# Patient Record
Sex: Female | Born: 1945 | Race: White | Hispanic: No | Marital: Married | Smoking: Never smoker
Health system: Southern US, Community
[De-identification: ages and names within clinical notes are randomized; demographics above are authoritative.]

## PROBLEM LIST (undated history)

## (undated) ENCOUNTER — Emergency Department (HOSPITAL_COMMUNITY): Discharge status: Absent without leave | Payer: Medicare Other

## (undated) DIAGNOSIS — E785 Hyperlipidemia, unspecified: Secondary | ICD-10-CM

## (undated) DIAGNOSIS — J040 Acute laryngitis: Secondary | ICD-10-CM

## (undated) DIAGNOSIS — I10 Essential (primary) hypertension: Secondary | ICD-10-CM

## (undated) DIAGNOSIS — C801 Malignant (primary) neoplasm, unspecified: Secondary | ICD-10-CM

## (undated) DIAGNOSIS — K219 Gastro-esophageal reflux disease without esophagitis: Secondary | ICD-10-CM

## (undated) DIAGNOSIS — F32A Depression, unspecified: Secondary | ICD-10-CM

## (undated) DIAGNOSIS — F419 Anxiety disorder, unspecified: Secondary | ICD-10-CM

## (undated) DIAGNOSIS — R000 Tachycardia, unspecified: Secondary | ICD-10-CM

## (undated) DIAGNOSIS — M8580 Other specified disorders of bone density and structure, unspecified site: Secondary | ICD-10-CM

## (undated) DIAGNOSIS — R51 Headache: Principal | ICD-10-CM

## (undated) DIAGNOSIS — F329 Major depressive disorder, single episode, unspecified: Secondary | ICD-10-CM

## (undated) HISTORY — DX: Headache: R51

## (undated) HISTORY — DX: Major depressive disorder, single episode, unspecified: F32.9

## (undated) HISTORY — DX: Malignant (primary) neoplasm, unspecified: C80.1

## (undated) HISTORY — DX: Gastro-esophageal reflux disease without esophagitis: K21.9

## (undated) HISTORY — DX: Hyperlipidemia, unspecified: E78.5

## (undated) HISTORY — DX: Other specified disorders of bone density and structure, unspecified site: M85.80

## (undated) HISTORY — DX: Tachycardia, unspecified: R00.0

## (undated) HISTORY — DX: Depression, unspecified: F32.A

## (undated) HISTORY — DX: Anxiety disorder, unspecified: F41.9

## (undated) HISTORY — DX: Acute laryngitis: J04.0

---

## 1997-08-21 ENCOUNTER — Ambulatory Visit: Admission: RE | Admit: 1997-08-21 | Discharge: 1997-08-21 | Payer: Self-pay

## 1998-11-27 ENCOUNTER — Emergency Department (HOSPITAL_COMMUNITY): Admission: EM | Admit: 1998-11-27 | Discharge: 1998-11-27 | Payer: Self-pay | Admitting: Emergency Medicine

## 2001-02-04 ENCOUNTER — Emergency Department (HOSPITAL_COMMUNITY): Admission: EM | Admit: 2001-02-04 | Discharge: 2001-02-04 | Payer: Self-pay | Admitting: Emergency Medicine

## 2001-02-10 ENCOUNTER — Encounter: Payer: Self-pay | Admitting: Family Medicine

## 2001-02-10 ENCOUNTER — Encounter: Payer: Self-pay | Admitting: Neurology

## 2001-02-10 ENCOUNTER — Ambulatory Visit (HOSPITAL_COMMUNITY): Admission: RE | Admit: 2001-02-10 | Discharge: 2001-02-10 | Payer: Self-pay | Admitting: Family Medicine

## 2001-02-15 ENCOUNTER — Encounter: Admission: RE | Admit: 2001-02-15 | Discharge: 2001-05-16 | Payer: Self-pay | Admitting: Neurology

## 2001-02-20 ENCOUNTER — Encounter: Payer: Self-pay | Admitting: Family Medicine

## 2001-02-20 ENCOUNTER — Encounter: Admission: RE | Admit: 2001-02-20 | Discharge: 2001-02-20 | Payer: Self-pay | Admitting: Family Medicine

## 2003-11-24 ENCOUNTER — Emergency Department (HOSPITAL_COMMUNITY): Admission: EM | Admit: 2003-11-24 | Discharge: 2003-11-24 | Payer: Self-pay | Admitting: Emergency Medicine

## 2004-01-31 ENCOUNTER — Ambulatory Visit: Payer: Self-pay | Admitting: Internal Medicine

## 2004-02-03 ENCOUNTER — Ambulatory Visit (HOSPITAL_COMMUNITY): Admission: RE | Admit: 2004-02-03 | Discharge: 2004-02-03 | Payer: Self-pay | Admitting: Internal Medicine

## 2004-02-03 ENCOUNTER — Encounter (INDEPENDENT_AMBULATORY_CARE_PROVIDER_SITE_OTHER): Payer: Self-pay | Admitting: *Deleted

## 2004-02-03 ENCOUNTER — Ambulatory Visit: Payer: Self-pay | Admitting: Internal Medicine

## 2004-02-03 HISTORY — PX: COLONOSCOPY: SHX174

## 2004-02-03 LAB — HM COLONOSCOPY

## 2004-05-21 ENCOUNTER — Ambulatory Visit: Payer: Self-pay | Admitting: Internal Medicine

## 2004-05-21 ENCOUNTER — Ambulatory Visit: Payer: Self-pay | Admitting: Family Medicine

## 2004-06-23 ENCOUNTER — Ambulatory Visit: Payer: Self-pay | Admitting: Internal Medicine

## 2004-08-20 ENCOUNTER — Ambulatory Visit: Payer: Self-pay | Admitting: Family Medicine

## 2004-08-27 ENCOUNTER — Ambulatory Visit: Payer: Self-pay | Admitting: Family Medicine

## 2005-02-04 ENCOUNTER — Ambulatory Visit: Payer: Self-pay | Admitting: Family Medicine

## 2005-08-17 ENCOUNTER — Ambulatory Visit: Payer: Self-pay | Admitting: Family Medicine

## 2005-09-20 ENCOUNTER — Ambulatory Visit: Payer: Self-pay | Admitting: Family Medicine

## 2005-10-01 ENCOUNTER — Ambulatory Visit: Payer: Self-pay | Admitting: Family Medicine

## 2006-01-13 ENCOUNTER — Ambulatory Visit: Payer: Self-pay | Admitting: Family Medicine

## 2006-03-11 ENCOUNTER — Ambulatory Visit: Payer: Self-pay | Admitting: Family Medicine

## 2006-03-11 ENCOUNTER — Encounter: Admission: RE | Admit: 2006-03-11 | Discharge: 2006-03-11 | Payer: Self-pay | Admitting: Family Medicine

## 2006-05-09 ENCOUNTER — Encounter: Admission: RE | Admit: 2006-05-09 | Discharge: 2006-05-09 | Payer: Self-pay | Admitting: Orthopaedic Surgery

## 2006-07-14 ENCOUNTER — Encounter: Admission: RE | Admit: 2006-07-14 | Discharge: 2006-07-14 | Payer: Self-pay | Admitting: Orthopaedic Surgery

## 2006-10-28 DIAGNOSIS — F329 Major depressive disorder, single episode, unspecified: Secondary | ICD-10-CM | POA: Insufficient documentation

## 2006-10-28 DIAGNOSIS — F411 Generalized anxiety disorder: Secondary | ICD-10-CM | POA: Insufficient documentation

## 2006-10-28 DIAGNOSIS — K219 Gastro-esophageal reflux disease without esophagitis: Secondary | ICD-10-CM | POA: Insufficient documentation

## 2006-10-28 DIAGNOSIS — E785 Hyperlipidemia, unspecified: Secondary | ICD-10-CM | POA: Insufficient documentation

## 2006-10-28 DIAGNOSIS — N943 Premenstrual tension syndrome: Secondary | ICD-10-CM | POA: Insufficient documentation

## 2006-10-28 DIAGNOSIS — G43909 Migraine, unspecified, not intractable, without status migrainosus: Secondary | ICD-10-CM | POA: Insufficient documentation

## 2006-11-01 ENCOUNTER — Telehealth (INDEPENDENT_AMBULATORY_CARE_PROVIDER_SITE_OTHER): Payer: Self-pay | Admitting: *Deleted

## 2006-12-05 ENCOUNTER — Ambulatory Visit: Payer: Self-pay | Admitting: Family Medicine

## 2006-12-05 LAB — CONVERTED CEMR LAB
ALT: 12 units/L (ref 0–35)
Basophils Absolute: 0 10*3/uL (ref 0.0–0.1)
Bilirubin, Direct: 0.1 mg/dL (ref 0.0–0.3)
CO2: 34 meq/L — ABNORMAL HIGH (ref 19–32)
Calcium: 9.2 mg/dL (ref 8.4–10.5)
Chloride: 108 meq/L (ref 96–112)
Creatinine, Ser: 1 mg/dL (ref 0.4–1.2)
Eosinophils Relative: 1.4 % (ref 0.0–5.0)
Glucose, Bld: 102 mg/dL — ABNORMAL HIGH (ref 70–99)
LDL Cholesterol: 105 mg/dL — ABNORMAL HIGH (ref 0–99)
MCV: 94.3 fL (ref 78.0–100.0)
Monocytes Relative: 8.4 % (ref 3.0–11.0)
Nitrite: NEGATIVE
Platelets: 249 10*3/uL (ref 150–400)
Protein, U semiquant: NEGATIVE
RDW: 12.4 % (ref 11.5–14.6)
Sodium: 145 meq/L (ref 135–145)
Specific Gravity, Urine: >=1.03
TSH: 2.15 microintl units/mL (ref 0.35–5.50)
Total Protein: 6.6 g/dL (ref 6.0–8.3)
Urobilinogen, UA: NEGATIVE
VLDL: 16 mg/dL (ref 0–40)
pH: 6

## 2006-12-09 ENCOUNTER — Ambulatory Visit: Payer: Self-pay | Admitting: Family Medicine

## 2006-12-20 ENCOUNTER — Telehealth: Payer: Self-pay | Admitting: Family Medicine

## 2006-12-21 ENCOUNTER — Telehealth: Payer: Self-pay | Admitting: Family Medicine

## 2007-01-31 ENCOUNTER — Ambulatory Visit: Payer: Self-pay | Admitting: Family Medicine

## 2007-02-09 ENCOUNTER — Telehealth: Payer: Self-pay | Admitting: Family Medicine

## 2007-02-28 ENCOUNTER — Telehealth: Payer: Self-pay | Admitting: Family Medicine

## 2007-03-15 ENCOUNTER — Ambulatory Visit: Payer: Self-pay | Admitting: Family Medicine

## 2007-06-27 ENCOUNTER — Ambulatory Visit: Payer: Self-pay | Admitting: Family Medicine

## 2007-06-30 ENCOUNTER — Telehealth: Payer: Self-pay | Admitting: Family Medicine

## 2007-07-03 ENCOUNTER — Emergency Department (HOSPITAL_COMMUNITY): Admission: EM | Admit: 2007-07-03 | Discharge: 2007-07-03 | Payer: Self-pay | Admitting: Emergency Medicine

## 2007-07-06 ENCOUNTER — Telehealth: Payer: Self-pay | Admitting: Family Medicine

## 2007-07-10 DIAGNOSIS — L509 Urticaria, unspecified: Secondary | ICD-10-CM | POA: Insufficient documentation

## 2007-07-11 ENCOUNTER — Ambulatory Visit: Payer: Self-pay | Admitting: Family Medicine

## 2007-07-12 ENCOUNTER — Ambulatory Visit: Payer: Self-pay | Admitting: Family Medicine

## 2007-07-14 ENCOUNTER — Telehealth: Payer: Self-pay | Admitting: Family Medicine

## 2007-07-14 ENCOUNTER — Ambulatory Visit: Payer: Self-pay | Admitting: Family Medicine

## 2007-07-18 ENCOUNTER — Ambulatory Visit: Payer: Self-pay | Admitting: Family Medicine

## 2007-07-18 ENCOUNTER — Telehealth: Payer: Self-pay | Admitting: Family Medicine

## 2007-07-27 ENCOUNTER — Telehealth: Payer: Self-pay | Admitting: Family Medicine

## 2007-08-02 ENCOUNTER — Ambulatory Visit: Payer: Self-pay | Admitting: Family Medicine

## 2007-08-02 LAB — CONVERTED CEMR LAB
Blood in Urine, dipstick: NEGATIVE
Ketones, urine, test strip: NEGATIVE
Protein, U semiquant: NEGATIVE
Specific Gravity, Urine: 1.01
Urobilinogen, UA: 0.2
WBC Urine, dipstick: NEGATIVE

## 2007-12-28 ENCOUNTER — Ambulatory Visit: Payer: Self-pay | Admitting: Family Medicine

## 2008-03-08 ENCOUNTER — Ambulatory Visit: Payer: Self-pay | Admitting: Family Medicine

## 2008-03-08 LAB — CONVERTED CEMR LAB
ALT: 10 units/L (ref 0–35)
AST: 17 units/L (ref 0–37)
Albumin: 4.2 g/dL (ref 3.5–5.2)
Basophils Absolute: 0 10*3/uL (ref 0.0–0.1)
Basophils Relative: 0.3 % (ref 0.0–3.0)
Cholesterol: 273 mg/dL (ref 0–200)
Creatinine, Ser: 1.1 mg/dL (ref 0.4–1.2)
Direct LDL: 159.9 mg/dL
Eosinophils Relative: 1.4 % (ref 0.0–5.0)
Hemoglobin: 14.1 g/dL (ref 12.0–15.0)
MCHC: 34.6 g/dL (ref 30.0–36.0)
Monocytes Absolute: 0.4 10*3/uL (ref 0.1–1.0)
Neutro Abs: 2.4 10*3/uL (ref 1.4–7.7)
Nitrite: NEGATIVE
Platelets: 249 10*3/uL (ref 150–400)
Potassium: 4.5 meq/L (ref 3.5–5.1)
Sodium: 144 meq/L (ref 135–145)
Specific Gravity, Urine: >=1.03
TSH: 2.47 microintl units/mL (ref 0.35–5.50)
Total CHOL/HDL Ratio: 3.1
Urobilinogen, UA: 0.2
VLDL: 16 mg/dL (ref 0–40)

## 2008-03-12 ENCOUNTER — Ambulatory Visit: Payer: Self-pay | Admitting: Family Medicine

## 2008-04-02 ENCOUNTER — Encounter: Admission: RE | Admit: 2008-04-02 | Discharge: 2008-04-02 | Payer: Self-pay | Admitting: Family Medicine

## 2008-04-12 ENCOUNTER — Telehealth: Payer: Self-pay | Admitting: *Deleted

## 2008-06-11 ENCOUNTER — Telehealth: Payer: Self-pay | Admitting: *Deleted

## 2008-08-12 ENCOUNTER — Telehealth: Payer: Self-pay | Admitting: Internal Medicine

## 2008-08-14 ENCOUNTER — Ambulatory Visit: Payer: Self-pay | Admitting: Internal Medicine

## 2008-08-15 LAB — CONVERTED CEMR LAB
Alkaline Phosphatase: 52 units/L (ref 39–117)
Basophils Relative: 0.4 % (ref 0.0–3.0)
CO2: 33 meq/L — ABNORMAL HIGH (ref 19–32)
Eosinophils Absolute: 0.1 10*3/uL (ref 0.0–0.7)
Glucose, Bld: 112 mg/dL — ABNORMAL HIGH (ref 70–99)
HCT: 39.6 % (ref 36.0–46.0)
Hemoglobin: 14 g/dL (ref 12.0–15.0)
Lymphocytes Relative: 22.6 % (ref 12.0–46.0)
MCHC: 35.2 g/dL (ref 30.0–36.0)
MCV: 93.1 fL (ref 78.0–100.0)
Neutro Abs: 3.6 10*3/uL (ref 1.4–7.7)
Potassium: 3.9 meq/L (ref 3.5–5.1)
RBC: 4.25 M/uL (ref 3.87–5.11)
Sodium: 146 meq/L — ABNORMAL HIGH (ref 135–145)
Total Bilirubin: 0.7 mg/dL (ref 0.3–1.2)

## 2008-08-19 ENCOUNTER — Telehealth: Payer: Self-pay | Admitting: Internal Medicine

## 2008-08-27 ENCOUNTER — Telehealth: Payer: Self-pay | Admitting: Internal Medicine

## 2008-08-27 ENCOUNTER — Encounter: Payer: Self-pay | Admitting: Internal Medicine

## 2008-08-29 ENCOUNTER — Ambulatory Visit: Payer: Self-pay | Admitting: Internal Medicine

## 2008-08-29 HISTORY — PX: UPPER GASTROINTESTINAL ENDOSCOPY: SHX188

## 2008-09-16 ENCOUNTER — Ambulatory Visit: Payer: Self-pay | Admitting: Internal Medicine

## 2008-12-12 ENCOUNTER — Telehealth: Payer: Self-pay | Admitting: Family Medicine

## 2009-01-22 ENCOUNTER — Ambulatory Visit: Payer: Self-pay | Admitting: Family Medicine

## 2009-03-31 ENCOUNTER — Telehealth: Payer: Self-pay | Admitting: Internal Medicine

## 2009-04-25 ENCOUNTER — Ambulatory Visit: Payer: Self-pay | Admitting: Internal Medicine

## 2009-04-25 LAB — CONVERTED CEMR LAB
Alkaline Phosphatase: 49 units/L (ref 39–117)
Basophils Absolute: 0.1 10*3/uL (ref 0.0–0.1)
Basophils Relative: 1.3 % (ref 0.0–3.0)
Calcium: 9.8 mg/dL (ref 8.4–10.5)
Creatinine, Ser: 1.1 mg/dL (ref 0.4–1.2)
Direct LDL: 130.4 mg/dL
Eosinophils Absolute: 0.1 10*3/uL (ref 0.0–0.7)
Glucose, Bld: 103 mg/dL — ABNORMAL HIGH (ref 70–99)
HCT: 40.6 % (ref 36.0–46.0)
HDL: 90.2 mg/dL (ref >39.00)
Lymphocytes Relative: 25.9 % (ref 12.0–46.0)
Lymphs Abs: 1.3 10*3/uL (ref 0.7–4.0)
MCHC: 34.2 g/dL (ref 30.0–36.0)
Monocytes Absolute: 0.4 10*3/uL (ref 0.1–1.0)
Neutro Abs: 3.2 10*3/uL (ref 1.4–7.7)
Neutrophils Relative %: 62.1 % (ref 43.0–77.0)
Nitrite: NEGATIVE
Platelets: 246 10*3/uL (ref 150.0–400.0)
Specific Gravity, Urine: 1.025
TSH: 3.11 microintl units/mL (ref 0.35–5.50)
Total Bilirubin: 1.2 mg/dL (ref 0.3–1.2)
Urobilinogen, UA: 0.2
WBC Urine, dipstick: NEGATIVE
WBC: 5.1 10*3/uL (ref 4.5–10.5)
pH: 5.5

## 2009-05-06 ENCOUNTER — Telehealth: Payer: Self-pay | Admitting: *Deleted

## 2009-05-06 ENCOUNTER — Ambulatory Visit: Payer: Self-pay | Admitting: Internal Medicine

## 2009-05-07 ENCOUNTER — Telehealth: Payer: Self-pay | Admitting: Internal Medicine

## 2009-05-13 ENCOUNTER — Encounter: Payer: Self-pay | Admitting: Internal Medicine

## 2009-05-18 DIAGNOSIS — M899 Disorder of bone, unspecified: Secondary | ICD-10-CM | POA: Insufficient documentation

## 2009-05-18 DIAGNOSIS — M949 Disorder of cartilage, unspecified: Secondary | ICD-10-CM

## 2009-05-28 ENCOUNTER — Encounter: Admission: RE | Admit: 2009-05-28 | Discharge: 2009-05-28 | Payer: Self-pay | Admitting: Internal Medicine

## 2009-05-29 ENCOUNTER — Telehealth: Payer: Self-pay | Admitting: *Deleted

## 2009-06-06 ENCOUNTER — Telehealth: Payer: Self-pay | Admitting: *Deleted

## 2009-08-06 ENCOUNTER — Telehealth: Payer: Self-pay | Admitting: Internal Medicine

## 2009-09-22 ENCOUNTER — Telehealth: Payer: Self-pay | Admitting: Internal Medicine

## 2009-11-20 ENCOUNTER — Ambulatory Visit: Payer: Self-pay | Admitting: Internal Medicine

## 2009-11-20 ENCOUNTER — Telehealth: Payer: Self-pay | Admitting: Internal Medicine

## 2009-11-24 ENCOUNTER — Telehealth: Payer: Self-pay | Admitting: *Deleted

## 2009-12-03 ENCOUNTER — Encounter: Payer: Self-pay | Admitting: *Deleted

## 2009-12-05 ENCOUNTER — Ambulatory Visit: Payer: Self-pay | Admitting: Internal Medicine

## 2009-12-23 ENCOUNTER — Telehealth: Payer: Self-pay | Admitting: Internal Medicine

## 2009-12-23 LAB — CONVERTED CEMR LAB: PTH: 19.7 pg/mL (ref 14.0–72.0)

## 2010-02-16 ENCOUNTER — Telehealth: Payer: Self-pay | Admitting: *Deleted

## 2010-02-25 ENCOUNTER — Telehealth: Payer: Self-pay | Admitting: Internal Medicine

## 2010-04-18 ENCOUNTER — Other Ambulatory Visit: Payer: Self-pay | Admitting: Internal Medicine

## 2010-04-18 DIAGNOSIS — Z1239 Encounter for other screening for malignant neoplasm of breast: Secondary | ICD-10-CM

## 2010-04-19 ENCOUNTER — Encounter: Payer: Self-pay | Admitting: Orthopaedic Surgery

## 2010-04-28 NOTE — Progress Notes (Signed)
Summary: Needs labs and a rov  ---- Converted from flag ---- ---- 11/21/2009 5:58 PM, Madelin Headings MD wrote: make sure oder  is for vit d and intact pth dx osteopenia and then follow up with results  thanks ------------------------------  Pt didn't come in for these labs. I have left a message for pt to call back. Romualdo Bolk, CMA [facility])  November 24, 2009 4:56 PM  LMTOCB Romualdo Bolk, CMA [facility])  November 26, 2009 8:48 AM  LMTOCB Romualdo Bolk, CMA [facility])  December 02, 2009 8:54 AM  Pt has never returned my calls. I mailed her a letter about this. Romualdo Bolk, CMA (AAMA)  December 03, 2009 9:06 AM

## 2010-04-28 NOTE — Progress Notes (Signed)
Summary: Requesting to Switch PCP  Phone Note Call from Patient Call back at Home Phone [phone]   Caller: Patient Summary of Call: Pt requested to switch PCP from Todd to Caelen Reierson.  Checking to see if this has been authorized by both physicians so she can go ahead and make an appointment. Initial call taken by: Trixie Dredge,  March 31, 2009 12:21 PM  Follow-up for Phone Call        ok........Marland Kitchenset up new pt cpx w Dr. Marvell Fuller Follow-up by: Roderick Pee MD,  March 31, 2009 1:11 PM  Additional Follow-up for Phone Call Additional follow up Details #1::        Pt called back. I scheduled new pt cpx with Dr. Marvell Fuller as noted above. Additional Follow-up by: Lucy Antigua,  April 01, 2009 4:54 PM    Additional Follow-up for Phone Call Additional follow up Details #2::    ok  Follow-up by: Madelin Headings MD,  April 04, 2009 5:42 PM

## 2010-04-28 NOTE — Progress Notes (Signed)
Summary: questions about labs  Phone Note Call from Patient Call back at Home Phone [phone]   Caller: Patient Summary of Call: Pt is wanting to know why the dexa showed what it did if her labs are normal? Initial call taken by: Romualdo Bolk, CMA [facility]ull),  December 23, 2009 4:33 PM  Follow-up for Phone Call        lots of causes  We checked for the most  common... most often  age   nutrition and genetics related  .   rec weight bearing exercise  and enough calcium vit d in diet .  If wants to discuss    this then  can scedule ov to discuss . Follow-up by: Madelin Headings MD,  December 25, 2009 11:38 AM  Additional Follow-up for Phone Call Additional follow up Details #1::        Pt given Dr Rosezella Florida recommendatons. Additional Follow-up by: Lynann Beaver CMA,  December 26, 2009 10:54 AM

## 2010-04-28 NOTE — Progress Notes (Signed)
Summary: Medication  Phone Note Call from Patient Call back at Home Phone [phone]   Caller: Patient Call For: Dr. Leone Payor Reason for Call: Refill Medication Summary of Call: Need refill of Nexium #30.Marland Kitchen[facility]land KitchenMarland [facility] [phone] Initial call taken by: Karna Christmas,  Aug 06, 2009 9:40 AM  Follow-up for Phone Call        rx sent pt advised she needs ov in June she will call back to schedule . Follow-up by: Harlow Mares CMA [facility]),  Aug 07, 2009 2:07 PM    New/Updated Medications: NEXIUM 40 MG CPDR (ESOMEPRAZOLE MAGNESIUM) take one by mouth once daily Prescriptions: NEXIUM 40 MG CPDR (ESOMEPRAZOLE MAGNESIUM) take one by mouth once daily  #30 x 0   Entered by:   Harlow Mares CMA (AAMA)   Authorized by:   Iva Boop MD, [facility]   Signed by:   Harlow Mares CMA (AAMA) on 08/07/2009   Method used:   Electronically to        [facility]  [phone]* (retail)       [address]       [place], [place]  [address]       Ph: [phone] or [phone]       Fax: [phone]   RxID:   [id number]

## 2010-04-28 NOTE — Progress Notes (Signed)
Summary: Discuss alternatives instead of labs  Phone Note Call from Patient Call back at Home Phone [phone]   Caller: Patient Summary of Call: Pt called and has rcvd the results from her bone density test and does not understand them. Pt would like a call from Dr. Fabian Sharp or Carollee Herter to discuss.  Initial call taken by: Lucy Antigua,  May 29, 2009 2:25 PM  Follow-up for Phone Call        Left message for pt to call back. Follow-up by: Romualdo Bolk, CMA [facility] Dull),  May 30, 2009 4:24 PM  Additional Follow-up for Phone Call Additional follow up Details #1::        PT to drop off results to [place] and then we can let her know about the results. Additional Follow-up by: Romualdo Bolk, CMA [facility]ull),  May 30, 2009 4:34 PM    Additional Follow-up for Phone Call Additional follow up Details #2::    Left message for pt to call back. Need to see if pt has ever had a vit d or pth level done by another md. Romualdo Bolk, CMA (AAMA)  June 03, 2009 1:27 PM I spoke to pt and she hasn't taken any calcium or vit d supplements for 1 to 1 and 1/2 years. Pt has also never had her Vit D or PTH level check. Her last TSH was done in Jan 2011.   Follow-up by: Romualdo Bolk, CMA [facility]),  June 03, 2009 4:43 PM  Additional Follow-up for Phone Call Additional follow up Details #3:: Details for Additional Follow-up Action Taken: do the above tests and then ov to review   dx osteopenia Additional Follow-up by: Madelin Headings MD,  June 03, 2009 5:20 PM  Spoke to pt and she has a $5000.00 ded and she is wanting to know if there is a way to just put her on a supplement instead of doing labs. If it was coded under cpx it would have been covered.  She is wanting to keep her cost down. So she would like to know what the other alternatives are.  Romualdo Bolk, CMA (AAMA)  June 04, 2009 11:29 AM She can do vit d1000 international units per day and equivalent calcium 1200 mg per day.  Weight bearing exercise  at least 3 x per week.  and repeat  dexa in 2 years .  Madelin Headings MD  June 05, 2009 8:26 AM   LMTOCB Romualdo Bolk, CMA [facility])  June 05, 2009 8:49 AM Pt aware. Romualdo Bolk, CMA [facility])  June 05, 2009 11:49 AM

## 2010-04-28 NOTE — Assessment & Plan Note (Signed)
Summary: abd pain--ch.   History of Present Illness Visit Type: Follow-up Visit Primary GI MD: Stan Head MD [facility] Primary Provider: Governor Specking, MD Requesting Provider: n/a Chief Complaint: Follow up from Triage, pt c/o abdominal pain, pt states that the abdominal pain has subsided with taking the Nexium History of Present Illness:   65 yo ww with GERD but no esophageal inflammation or Barrett's esophagus. She has tried to go without or with as needed PPI but has been unable to. She is now on Nexium and epigastric burning seems controlled on 40 mg once daily. she has no Rx benefits and this is expensive but she is getting it. Minimizing caffeine and avoiding GERD inducing foods. In past had lost weight but this has levelled off.   GI Review of Systems      Denies abdominal pain, acid reflux, belching, bloating, chest pain, dysphagia with liquids, dysphagia with solids, heartburn, loss of appetite, nausea, vomiting, vomiting blood, weight loss, and  weight gain.        Denies anal fissure, black tarry stools, change in bowel habit, constipation, diarrhea, diverticulosis, fecal incontinence, heme positive stool, hemorrhoids, irritable bowel syndrome, jaundice, light color stool, liver problems, rectal bleeding, and  rectal pain.    Current Medications (verified): 1)  Maxzide-25 37.5-25 Mg  Tabs (Triamterene-Hctz) .... Take 1/2 Every Day 2)  Verapamil Hcl Cr 120 Mg Cr-Tabs (Verapamil Hcl) .Marland Kitchen.. 1 By Mouth Once Daily 3)  Maxalt 10 Mg  Tabs (Rizatriptan Benzoate) .... As Needed 4)  Estrace 0.1 Mg/gm  Crea (Estradiol) .... Uad W/o Applicator As Needed 5)  Taclonex 0.005-0.064 % Oint (Calcipotriene-Betameth Diprop) .... As Needed 6)  Lovastatin 10 Mg Tabs (Lovastatin) .Marland Kitchen.. 1 Tab @ Bedtime 7)  Analpram-Hc Singles 1-2.5 %  Crea (Hydrocortisone Ace-Pramoxine) .... Apply To Rectum 2-3 Times Per Day As Needed 8)  Trimethoprim 100 Mg Tabs (Trimethoprim) .Marland Kitchen.. 1 By Mouth As Directed  As  Needed 9)  Nexium 40 Mg Cpdr (Esomeprazole Magnesium) .... Take One By Mouth Once Daily 10)  Caltrate 600+d Plus 600-400 Mg-Unit Tabs (Calcium Carbonate-Vit D-Min) .... 2 By Mouth Once Daily  Allergies (verified): 1)  ! Erythromycin Ethylsuccinate 2)  ! Sulfadiazine (Sulfadiazine) 3)  ! Macrobid  Past History:  Past Medical History: Last updated: 05/06/2009 Anxiety Depression  remote hx of medication Hyperlipidemia PMS migraine headaches GERD Recurrent UTI's (largely post-coital) Child birth x 2  Hx of rapid heart rate Dexa -2.0 hip  2008  Osteopenia  Past Surgical History: Last updated: 2008-09-04 CB X2  Family History: Last updated: Sep 04, 2008 father it died at age 67, room of her arthritis.  Mother has hypertension, and four half sisters in good health.  No brothers No FH of Colon Cancer: Family History of Ovarian Cancer:Grandmother  Social History: Last updated: 05/06/2009 Married daughter and son, twins also that did not survive birth Never Smoked Alcohol use-no Drug use-no Regular exercise-yes-limited Daily Caffeine Use: One cup a day hh of 2   Vital Signs:  Patient profile:   65 year old female Menstrual status:  postmenopausal Height:      65.5 inches Weight:      130 pounds BMI:     21.38 BSA:     1.66 Pulse rate:   80 / minute Pulse rhythm:   regular BP sitting:   100 / 70  (left arm)  Vitals Entered By: Merri Ray CMA [facility]) (November 20, 2009 3:37 PM)  Physical Exam  General:  Well developed, well nourished, no acute  distress.   Impression & Recommendations:  Problem # 1:  GERD (ICD-530.81) Assessment Improved Nexium or other PPI chronically to control the symptoms. Rx written to get it from [place] to save $. We discussed possible bone loss issues with PPI's and how that seems plausible but not really proven. she needs a PPI to control sxs. She is on Ca and vit D but has not had vit D level checked.  15 mins  Problem # 2:   OSTEOPENIA (ICD-733.90) Assessment: Unchanged She has this and DEXA suggests more than normal rate of bone loss on sequential scans. We talked about checking a vit D level but I forgot to order this. Chart review shows that Dr. Fabian Sharp had considered a PTH level also but this was not done.  Will contact her and recommend she have the vit D level and PTH level (TSH is ok) and follow-up with Dr. Fabian Sharp as previously suggested re: osteopenia.  Patient Instructions: 1)  Continue Nexium as prescribed. 2)  Please schedule a follow-up appointment as needed.  3)  The medication list was reviewed and reconciled.  All changed / newly prescribed medications were explained.  A complete medication list was provided to the patient / caregiver. Prescriptions: NEXIUM 40 MG CPDR (ESOMEPRAZOLE MAGNESIUM) take one by mouth once daily  #140 x 3   Entered and Authorized by:   Iva Boop MD, [facility]   Signed by:   Iva Boop MD, [facility] on 11/20/2009   Method used:   Print then Give to Patient   RxID:   [id number]

## 2010-04-28 NOTE — Progress Notes (Signed)
Summary: vit D and PTH levels  Phone Note Outgoing Call   Summary of Call: Please let her know that sorry i did not get vit D leele ordered looking at Dr. Rosezella Florida recs from before she suggested a vit D and PTH intact level I agree and recommend she have these and follow-up with Dr. Fabian Sharp re: osteopenia issues. Iva Boop MD, [facility]  November 20, 2009 7:40 PM   Follow-up for Phone Call        pt is agreeable to plan.  Pt would prefer to go to Dr. Rosezella Florida office for labs as she closer to that office.  Advised pt I would send a copy of this note to Dr. Fabian Sharp and call their office to let them know she is coming. Pt states she will contact pharmacy today regarding Nexium.  Advised pt to call me when she starts her last bottle of samples if she has not received her mail order and I will put more samples at front desk. Follow-up by: Francee Piccolo CMA [facility]ull),  November 21, 2009 8:52 AM     Appended Document: vit D and PTH levels I spoke to [facility] at Dr. Rosezella Florida office.  She will put order/appt into [facility] for pt to come for labs.

## 2010-04-28 NOTE — Letter (Signed)
Summary: Generic Letter  [place] [facility]  [address] [place], [place] [address]   Phone: [phone]  Fax: [phone]    12/03/2009  [facility] [address] [place], [place]  [address]  Dear Ms. Matsuura,  We have been trying to contact you about your missed lab appointment. Please call us back at [phone] to reschedule this appointment.         Sincerely,   Tor Netters, CMA (AAMA)

## 2010-04-28 NOTE — Progress Notes (Signed)
Summary: Triage  Phone Note Call from Patient Call back at Home Phone [phone]   Caller: Patient Call For: Dr. Leone Payor Reason for Call: Talk to Nurse Summary of Call: pt is sch'd for 11-20-09 and having severe abd. pain, acid reflux, nauseated. Took Nexium for a mth and it helped until she went off of it Initial call taken by: Karna Christmas,  September 22, 2009 10:08 AM  Follow-up for Phone Call        Patient  with GERD and abdominal pain. She is out of Nexium and she didn't make an appointment as recommended in May.  Patient  is scheduled for an REV on 11/20/09.  I have sent her a refill to the pharmacy.  She has been offered an appointment with Mike Gip PA for tomorrow, but the patient wants to see Dr Leone Payor Follow-up by: Darcey Nora RN, CGRN,  September 22, 2009 10:34 AM    Prescriptions: NEXIUM 40 MG CPDR (ESOMEPRAZOLE MAGNESIUM) take one by mouth once daily  #30 x 1   Entered by:   Darcey Nora RN, CGRN   Authorized by:   Iva Boop MD, [facility]   Signed by:   Darcey Nora RN, CGRN on 09/22/2009   Method used:   Electronically to        [facility]. [phone]* (retail)       [phone] [address]       [place], [place]  [address]       Ph: [phone]       Fax: [phone]   RxID:   [id number]

## 2010-04-28 NOTE — Progress Notes (Signed)
Summary: Pt said generic Maxalt should have been sent to [facility]  Phone Note Call from Patient Call back at [facility] Phone [phone] Call back at [phone] cell   Caller: Patient Summary of Call: Pt called re: script for generic Maxalt script. Pt  said that script was suppose to be sent to [facility] not [facility]. Pls see previous phone note. Please put order number on rx [id number] fax # [phone] [facility]. Pt no longer uses [facility]. Pls remove [facility] from pt record.  Pt said since this was sent to wrong pharmacy, she will need samples of med in order to last until mail order arrives.  Initial call taken by: Lucy Antigua,  February 25, 2010 9:04 AM  Follow-up for Phone Call        Rx printed, signed, samples of Zomed given #5 oked by Dr Fabian Sharp as we had no samples of Maxalt.  Rx and samples will be picked up by pt, she will fax to her pharmacy Follow-up by: Sid Falcon LPN,  February 26, 2010 12:27 PM    Prescriptions: MAXALT 10 MG  TABS (RIZATRIPTAN BENZOATE) as needed [id number]  #4 x 1   Entered by:   Sid Falcon LPN   Authorized by:   Romualdo Bolk, CMA (AAMA)   Signed by:   Sid Falcon LPN on 13/24/4010   Method used:   Printed then faxed to ...       [facility]. [phone]* (retail)       [phone] [address]       [place], [place]  [address]       Ph: [phone]       Fax: [phone]   RxID:   [id number]

## 2010-04-28 NOTE — Progress Notes (Signed)
Summary: generic maxalt 10mg  #4  Phone Note Refill Request Message from:  Patient  Refills Requested: Medication #1:  MAXALT 10 MG  TABS as needed pt is requesting generic maxalt 10 mg #4. please put order number on rx [id number] fax # [phone] [facility]  Initial call taken by: Heron Sabins,  February 16, 2010 4:12 PM  Follow-up for Phone Call        ok   to refill x 2  Follow-up by: Madelin Headings MD,  February 16, 2010 4:49 PM  Additional Follow-up for Phone Call Additional follow up Details #1::        rx faxed to pharmacy. Additional Follow-up by: Romualdo Bolk, CMA (AAMA),  February 16, 2010 5:05 PM    New/Updated Medications: MAXALT 10 MG  TABS (RIZATRIPTAN BENZOATE) as needed [id number] Prescriptions: MAXALT 10 MG  TABS (RIZATRIPTAN BENZOATE) as needed [id number]  #4 x 1   Entered by:   Romualdo Bolk, CMA (AAMA)   Authorized by:   Madelin Headings MD   Signed by:   Romualdo Bolk, CMA (AAMA) on 02/16/2010   Method used:   Printed then faxed to ...       [facility]. [phone]* (retail)       [phone] [address].       [place], [place]  [address]       Ph: [phone]       Fax: [phone]   RxID:   [id number]

## 2010-04-28 NOTE — Procedures (Signed)
Summary: Colonoscopy: Hemorrhoids   Colonoscopy  Procedure date:  02/03/2004  Findings:      Results: Hemorrhoids. Location:  [facility].  Comments: Very small internal hemorrhoids. No polyps or cancer seen.  Comments:      Repeat colonoscopy in 10 years.    Procedures Next Due Date:    Colonoscopy: 01/2014  Patient Name: Brianna Harper, Brianna Harper MRN: [id number] Procedure Procedures: Colonoscopy CPT: 54098.  Personnel: Endoscopist: Iva Boop, MD, [facility]  Referred By: Mosetta Putt, M.D.  Exam Location: Exam performed in Endoscopy Suite. Outpatient  Patient Consent: Procedure, Alternatives, Risks and Benefits discussed, consent obtained,  Indications Symptoms: Hematochezia.  History  Current Medications: Patient is not currently taking Coumadin.  Pre-Exam Physical: Performed Feb 03, 2004. Cardio-pulmonary exam, Rectal exam, HEENT exam , Abdominal exam, Mental status exam WNL.  Exam Exam: Extent of exam reached: Cecum, extent intended: Cecum.  The cecum was identified by appendiceal orifice and IC valve. Patient position: on left side. Colon retroflexion performed. Images taken. ASA Classification: II. Tolerance: good.  Monitoring: Pulse and BP monitoring, Oximetry used. Supplemental O2 given.  Colon Prep Used MiraLax for colon prep. Prep results: excellent.  Fluoroscopy: Fluoroscopy was not used.  Sedation Meds: Patient assessed and found to be appropriate for moderate (conscious) sedation. Residual sedation present from prior procedure today.  Fentanyl 20 given IV. Versed 2 mg. given IV.  Findings - NORMAL EXAM: Cecum to Sigmoid Colon.  HEMORRHOIDS: Internal. Size: Grade I. ICD9: Hemorrhoids, Internal: 455.0. Comments: tiny.   Assessment Abnormal examination, see findings above.  Diagnoses: 455.0: Hemorrhoids, Internal.   Comments: Very mall internal hemorrhoids. No polyps or cancer seen. Events  Unplanned Interventions: No intervention  was required.  Plans Patient Education: Patient given standard instructions for: Hemorrhoids.  Disposition: After procedure patient sent to recovery. After recovery patient sent home.  Scheduling/Referral: Colonoscopy, to Iva Boop, MD, [facility], 10 yrs for repeat screening,  Primary Care Provider, to Mosetta Putt, M.D., as planned,   CC:   Mosetta Putt, MD  This report was created from the original endoscopy report, which was reviewed and signed by the above listed endoscopist.

## 2010-04-28 NOTE — Progress Notes (Signed)
Summary: Change verapamil 240mg  to 120mg   Phone Note Call from Patient Call back at [facility] Phone [phone]   Caller: Patient Complaint: Chest Pain Summary of Call: Pt called and said that the pharmacy told her she couldn't cut the verapamil 240mg  in 1/2. They are suggesting she take 120mg .  Can we change her to 120mg  and call in a 90 day supply? Initial call taken by: Romualdo Bolk, CMA [facility]ull),  June 06, 2009 9:18 AM  Follow-up for Phone Call        ok  Follow-up by: Madelin Headings MD,  June 06, 2009 4:58 PM  Additional Follow-up for Phone Call Additional follow up Details #1::        Rx sent to pharmacy and pt aware. Additional Follow-up by: Romualdo Bolk, CMA (AAMA),  June 06, 2009 5:01 PM    New/Updated Medications: VERAPAMIL HCL CR 120 MG CR-TABS (VERAPAMIL HCL) 1 by mouth once daily Prescriptions: VERAPAMIL HCL CR 120 MG CR-TABS (VERAPAMIL HCL) 1 by mouth once daily  #90 x 3   Entered by:   Romualdo Bolk, CMA (AAMA)   Authorized by:   Madelin Headings MD   Signed by:   Romualdo Bolk, CMA (AAMA) on 06/06/2009   Method used:   Electronically to        [facility]  [phone]* (retail)       [address]       [place], [place]  [address]       Ph: [phone] or [phone]       Fax: [phone]   RxID:   [id number]

## 2010-04-28 NOTE — Progress Notes (Signed)
Summary: maxzide Rx not given at [facility]  Phone Note Call from Patient Call back at [facility] Phone [phone]   Summary of Call: Did not get script for Maxzide- 25 37.5-25mg , triam 37.5mg /HCTZ 25mg  1/2 daily #45 for 90 day supply.  Please call into [facility], not [facility].  Under patient instructions, this is number 1.   Initial call taken by: Rudy Jew, RN,  May 06, 2009 4:49 PM  Follow-up for Phone Call        Rx sent to pharmacy Follow-up by: Romualdo Bolk, CMA [facility]),  May 06, 2009 5:04 PM    Prescriptions: MAXZIDE-25 37.5-25 MG  TABS (TRIAMTERENE-HCTZ) take 1/2 every day  #45 x 3   Entered by:   Romualdo Bolk, CMA (AAMA)   Authorized by:   Madelin Headings MD   Signed by:   Romualdo Bolk, CMA (AAMA) on 05/06/2009   Method used:   Electronically to        [facility]  [phone]* (retail)       [address]       [place], [place]  [address]       Ph: [phone] or [phone]       Fax: [phone]   RxID:   [id number]

## 2010-04-28 NOTE — Progress Notes (Signed)
Summary: severe ha a reaction to amox 875?  Phone Note Call from Patient Call back at [facility] Phone [phone]   Summary of Call: Re the amox 875 one three times a day.  Took one last pm & one today, 2nd one due, but she is experiencing severe headache.  Is the amox too strong?  Has taken 500mg  before.  The 875 is a scored tab.  Should she take 1/2? Initial call taken by: Rudy Jew, RN,  May 07, 2009 1:57 PM  Follow-up for Phone Call        she can try 1/2 dose  as let us know although  unsusal to cause this problem. ?could she be having her migraine and need to take her migrain med?  Follow-up by: Madelin Headings MD,  May 07, 2009 2:05 PM  Additional Follow-up for Phone Call Additional follow up Details #1::        Phone Call Completed Additional Follow-up by: Rudy Jew, RN,  May 07, 2009 2:15 PM

## 2010-04-28 NOTE — Assessment & Plan Note (Signed)
Summary: cpx/cjr/pt switched from Kiefer Opheim to Todd/per Dr/cjr   Vital Signs:  Patient profile:   65 year old female Menstrual status:  postmenopausal Height:      65.5 inches Weight:      131 pounds Pulse rate:   66 / minute BP sitting:   120 / 80  (right arm) Cuff size:   regular  Vitals Entered By: Romualdo Bolk, CMA (AAMA) (May 06, 2009 1:24 PM) CC: CPX- Discuss going pap     Menstrual Status postmenopausal Last PAP Result normal   History of Present Illness: Brianna Harper comesin today for    preventive visit . She is transferring care from Dr Tawanna Cooler.  She has a few ongoing medical conditionsthat are stable.  Her her husband had lost his job and she was not insurable because of  her dx of elevated lipids. So she is on new insurance with high deductable.   She has geen genereally healthy al her life .   LIPIDs ocass forgets her meds .  has been losing  weight  to help. MIGRAINES: better now   after menopause    rarely uses med .  BP:    better with recent weight loss.    at goal.    GI :   Leone Payor   no longer on antisasmodic  Problem with uri and sinus pressure and HA  ? sinsusitis.  would llike rx.    Preventive Care Screening  Pap Smear:    Date:  03/29/2008    Results:  normal   Prior Values:    Mammogram:  ASSESSMENT: Negative - BI-RADS 1^MM DIGITAL SCREENING (04/02/2008)    Colonoscopy:  Normal (03/29/2004)    Last Tetanus Booster:  Td (03/29/2005)   Preventive Screening-Counseling & Management  Alcohol-Tobacco     Alcohol drinks/day: 0     Smoking Status: never  Caffeine-Diet-Exercise     Caffeine use/day: 2     Does Patient Exercise: yes  Hep-HIV-STD-Contraception     Dental Visit-last 6 months yes     Sun Exposure-Excessive: no  Safety-Violence-Falls     Seat Belt Use: yes     Firearms in the Home: no firearms in the home     Smoke Detectors: yes      Blood Transfusions:  no.    Current Medications (verified): 1)  Maxzide-25 37.5-25  Mg  Tabs (Triamterene-Hctz) .... Take 1/2 Every Day 2)  Verapamil Hcl Cr 240 Mg  Tbcr (Verapamil Hcl) .... Take 1/2  Tablet By Mouth Once A Day 3)  Maxalt 10 Mg  Tabs (Rizatriptan Benzoate) .... As Needed 4)  Estrace 0.1 Mg/gm  Crea (Estradiol) .... Uad W/o Applicator As Needed 5)  Taclonex 0.005-0.064 % Oint (Calcipotriene-Betameth Diprop) .... As Needed 6)  Lovastatin 10 Mg Tabs (Lovastatin) .Marland Kitchen.. 1 Tab @ Bedtime 7)  Librax 2.5-5 Mg Caps (Clidinium-Chlordiazepoxide) .Marland Kitchen.. 1 By Mouth Hs 8)  Analpram-Hc Singles 1-2.5 %  Crea (Hydrocortisone Ace-Pramoxine) .... Apply To Rectum 2-3 Times Per Day As Needed  Allergies (verified): 1)  ! Erythromycin Ethylsuccinate 2)  ! Sulfadiazine (Sulfadiazine) 3)  ! Macrobid  Past History:  Past Medical History: Anxiety Depression  remote hx of medication Hyperlipidemia PMS migraine headaches GERD Recurrent UTI's (largely post-coital) Child birth x 2  Hx of rapid heart rate Dexa -2.0 hip  2008  Osteopenia  Past History:  Care Management: Gastroenterology: Leone Payor Dermatology: Lajean Silvius the past Urology [place]  remotely  Dr Sandria Manly neurology PMH-FH-SH reviewed-no changes except otherwise  noted  Family History: Reviewed history from 08/14/2008 and no changes required. father it died at age 66, room of her arthritis.  Mother has hypertension, and four half sisters in good health.  No brothers No FH of Colon Cancer: Family History of Ovarian Cancer:Grandmother  Social History: Reviewed history from 09/16/2008 and no changes required. Married daughter and son, twins also that did not survive birth Never Smoked Alcohol use-no Drug use-no Regular exercise-yes-limited Daily Caffeine Use: One cup a day hh of 2  Caffeine use/day:  2 Dental Care w/in 6 mos.:  yes Sun Exposure-Excessive:  no Seat Belt Use:  yes Blood Transfusions:  no  Review of Systems  The patient denies anorexia, fever, weight loss, weight gain, vision loss,  decreased hearing, hoarseness, chest pain, syncope, dyspnea on exertion, peripheral edema, prolonged cough, headaches, hemoptysis, abdominal pain, melena, hematochezia, severe indigestion/heartburn, hematuria, muscle weakness, suspicious skin lesions, transient blindness, difficulty walking, depression, unusual weight change, abnormal bleeding, enlarged lymph nodes, angioedema, and breast masses.   Physical Exam General Appearance: well developed, well nourished, no acute distress Eyes: conjunctiva and lids normal, PERRLA, EOMI, WNL Ears, Nose, Mouth, Throat: TM clear, nares mild congestion  face pressure to palpation , oral exam WNL Neck: supple, no lymphadenopathy, no thyromegaly, no JVD Respiratory: clear to auscultation and percussion, respiratory effort normal Cardiovascular: regular rate and rhythm, S1-S2, no murmur, rub or gallop, no bruits, peripheral pulses normal and symmetric, no cyanosis, clubbing, edema or varicosities Chest: no scars, masses, tenderness; no asymmetry, skin changes, nipple discharge   Gastrointestinal: soft, non-tender; no hepatosplenomegaly, masses; active bowel sounds all quadrants, guaiac negative stool; no masses, tenderness, hemorrhoids  Genitourinary: no vaginal discharge, lesions; no masses or tenderness Lymphatic: no cervical, axillary or inguinal adenopathy Musculoskeletal: gait normal, muscle tone and strength WNL, no joint swelling, effusions, discoloration, crepitus  Skin: clear, good turgor, color WNL, no rashes, lesions, or ulcerations Neurologic: normal mental status, normal reflexes, normal strength, sensation, and motion Psychiatric: alert; oriented to person, place and time Other Exam:   See labs     Impression & Recommendations:  Problem # 1:  HEALTH MAINTENANCE EXAM, ADULT (ICD-V70.0) contiue healthy lifestyle .   increase exercise .  dexa  and mammogram due.   Problem # 2:  HYPERLIPIDEMIA (ICD-272.4)  Her updated medication list for this  problem includes:    Lovastatin 10 Mg Tabs (Lovastatin) .Marland Kitchen... 1 tab @ bedtime  Labs Reviewed: SGOT: 18 (04/25/2009)   SGPT: 13 (04/25/2009)   HDL:90.20 (04/25/2009), 89.2 (03/08/2008)  LDL:DEL (03/08/2008), 105 (12/05/2006)  Chol:239 (04/25/2009), 273 (03/08/2008)  Trig:72.0 (04/25/2009), 80 (03/08/2008)  Problem # 3:  MIGRAINE HEADACHE (ICD-346.90)  Her updated medication list for this problem includes:    Maxalt 10 Mg Tabs (Rizatriptan benzoate) .Marland Kitchen... 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  discussion  The following medications were removed from the medication list:    Cipro 500 Mg Tabs (Ciprofloxacin hcl) .Marland Kitchen..Marland Kitchen Two times a day Her updated medication list for this problem includes:    Amoxicillin 875 Mg Tabs (Amoxicillin) .Marland Kitchen... 1 by mouth three times a day    Trimethoprim 100 Mg Tabs (Trimethoprim) .Marland Kitchen... 1 by mouth as directed  Problem # 5:  DEPRESSION (ICD-311) no currently problematic   Problem # 6:  OSTEOPENIA (ICD-733.90) due for dexa to compare to 2008 value.   Problem # 7:  poss sinusitis  prolonged uri    ok to  do empiric rx.  Complete Medication List: 1)  Maxzide-25 37.5-25 Mg Tabs (Triamterene-hctz) .... Take 1/2 every day 2)  Verapamil Hcl Cr 240 Mg Tbcr (Verapamil hcl) .... Take 1/2  tablet by mouth once a day 3)  Maxalt 10 Mg Tabs (Rizatriptan benzoate) .... As needed 4)  Estrace 0.1 Mg/gm Crea (Estradiol) .... Uad w/o applicator as needed 5)  Taclonex 0.005-0.064 % Oint (Calcipotriene-betameth diprop) .... As needed 6)  Lovastatin 10 Mg Tabs (Lovastatin) .Marland Kitchen.. 1 tab @ bedtime 7)  Librax 2.5-5 Mg Caps (Clidinium-chlordiazepoxide) .Marland Kitchen.. 1 by mouth hs 8)  Analpram-hc Singles 1-2.5 % Crea (Hydrocortisone ace-pramoxine) .... Apply to rectum 2-3 times per day as needed 9)  Amoxicillin 875 Mg Tabs (Amoxicillin) .Marland Kitchen.. 1 by mouth three times a day 10)  Trimethoprim 100 Mg Tabs (Trimethoprim) .Marland Kitchen.. 1 by  mouth as directed  Patient Instructions: 1)  Schedule    DEXA scan .  2)  Vitamin D 800 -1000 international units. 3)  Consider Vit d level as appropriate  .   4)  no change in medications.  5)  PAP next year  6)  get mammogram  Prescriptions: TRIMETHOPRIM 100 MG TABS (TRIMETHOPRIM) 1 by mouth as directed  #30 x 2   Entered and Authorized by:   Madelin Headings MD   Signed by:   Madelin Headings MD on 05/06/2009   Method used:   Print then Give to Patient   RxID:   [id number] AMOXICILLIN 875 MG TABS (AMOXICILLIN) 1 by mouth three times a day  #30 x 0   Entered and Authorized by:   Madelin Headings MD   Signed by:   Madelin Headings MD on 05/06/2009   Method used:   Print then Give to Patient   RxID:   [id number] LOVASTATIN 10 MG TABS (LOVASTATIN) 1 tab @ bedtime  #90 x 3   Entered and Authorized by:   Madelin Headings MD   Signed by:   Madelin Headings MD on 05/06/2009   Method used:   Print then Give to Patient   RxID:   [id number] VERAPAMIL HCL CR 240 MG  TBCR (VERAPAMIL HCL) Take 1/2  tablet by mouth once a day  #90 x 3   Entered and Authorized by:   Madelin Headings MD   Signed by:   Madelin Headings MD on 05/06/2009   Method used:   Print then Give to Patient   RxID:   [id number]

## 2010-05-11 ENCOUNTER — Other Ambulatory Visit: Payer: Self-pay | Admitting: Internal Medicine

## 2010-05-11 ENCOUNTER — Telehealth: Payer: Self-pay | Admitting: Internal Medicine

## 2010-05-11 MED ORDER — VERAPAMIL HCL ER 240 MG PO TBCR: 240.0000 mg | EXTENDED_RELEASE_TABLET | Freq: Every day | ORAL | Status: DC

## 2010-05-11 NOTE — Telephone Encounter (Signed)
Pt will run out of verapamil 240 mg er tab before cpx appt in April.  Needs a rx sent to [facility] to hold her over until appt.  Pt request to have directions written to take 1 tablet by mouth every day instead of 1/2 tablet my mouth, so that insurance will cover more.

## 2010-05-19 ENCOUNTER — Telehealth: Payer: Self-pay | Admitting: Internal Medicine

## 2010-05-26 NOTE — Progress Notes (Signed)
Summary: Needs to taper off Nexium  Phone Note Call from Patient Call back at Home Phone [phone]   Call For: Dr Leone Payor Reason for Call: Talk to Nurse Summary of Call: Needs instructions on how to taper off Nexium. says at her last rov Dr suggested she did not just stop it all together Initial call taken by: Leanor Kail [facility],  May 19, 2010 1:07 PM  Follow-up for Phone Call        Patient says you gave her instructions to taper off of Nexium and to not abruptly stop taking it.  She is concernied about the PPIs long term effect on bone loss.  Please advise Follow-up by: Darcey Nora RN, CGRN,  May 19, 2010 3:52 PM  Additional Follow-up for Phone Call Additional follow up Details #1::        i indicated that she needed to remain on it to control her symptoms. She would stop and then have to restart. So, she may have problems off the Nexium but can try. I know she is concerned about the bones. I think she will need something to keep GERD sxs and abd pain controlled. I am suggesting she try H2 blokers 9less association with possible bone loss) and would recommend going to ranitidine 150 mg two times a day for starters and take Nexium every other day for a week when she starts the ranidtidine. She can get the ranitidine OTC or we can rx if needed. she must let us know if it isn't working so she does not get into the situations she has before - and can go back to Nexium if having problems again. She has cost issues with meds,  Additional Follow-up by: Iva Boop MD, Clementeen Graham,  May 20, 2010 10:59 AM    Additional Follow-up for Phone Call Additional follow up Details #2::    Left message for patient to call back Darcey Nora RN, [facility]  May 20, 2010 11:52 AM  patient advised of Dr Marvell Fuller recommendations.  She will call back for further problems.   Follow-up by: Darcey Nora RN, CGRN,  May 20, 2010 3:00 PM  New/Updated Medications: RANITIDINE HCL 150 MG CAPS  (RANITIDINE HCL) 1 by mouth two times a day

## 2010-06-01 ENCOUNTER — Ambulatory Visit
Admission: RE | Admit: 2010-06-01 | Discharge: 2010-06-01 | Disposition: A | Discharge status: Home / Self Care | Payer: PRIVATE HEALTH INSURANCE | Source: Ambulatory Visit | Attending: Internal Medicine | Admitting: Internal Medicine

## 2010-06-01 DIAGNOSIS — Z1239 Encounter for other screening for malignant neoplasm of breast: Secondary | ICD-10-CM

## 2010-06-11 ENCOUNTER — Encounter: Payer: Self-pay | Admitting: Internal Medicine

## 2010-06-11 ENCOUNTER — Ambulatory Visit (INDEPENDENT_AMBULATORY_CARE_PROVIDER_SITE_OTHER): Payer: PRIVATE HEALTH INSURANCE | Admitting: Internal Medicine

## 2010-06-11 VITALS — BP 120/80 | HR 96 | Temp 98.3°F | Wt 133.0 lb

## 2010-06-11 DIAGNOSIS — J029 Acute pharyngitis, unspecified: Secondary | ICD-10-CM | POA: Insufficient documentation

## 2010-06-11 DIAGNOSIS — Z87440 Personal history of urinary (tract) infections: Secondary | ICD-10-CM | POA: Insufficient documentation

## 2010-06-11 DIAGNOSIS — K219 Gastro-esophageal reflux disease without esophagitis: Secondary | ICD-10-CM

## 2010-06-11 LAB — POCT RAPID STREP A (OFFICE): Rapid Strep A Screen: NEGATIVE

## 2010-06-11 MED ORDER — TRIMETHOPRIM 100 MG PO TABS: 100.0000 mg | ORAL_TABLET | ORAL | Status: DC | PRN

## 2010-06-11 NOTE — Assessment & Plan Note (Signed)
Has attended his lifestyle issues and is trying to wean off of the Nexium to ranitidine because of the risk of decreased vitamin nutrient absorption. She is currently on ranitidine twice a day and some alternating Nexium. As advised by Dr. Leone Payor.

## 2010-06-11 NOTE — Assessment & Plan Note (Signed)
Off and on for a few months. Now it is getting worse as she is trying to wean her Nexium and it is unclear if it's related to reflux. She does state it is one of the most severe sore throat she has had. It is not burning like her reflux. She states it hurts to swallow there is no drooling nasal congestion drainage allergy symptoms known exposure to strep .    Would suggest increasing her ranitidine consider an ENT appointment minimizing her pill counts temporarily,  And even consider Carafate if it is ongoing. She adamantly denies environmental exposures that could do this.   She does not think her in by her mental exposures have changed over the years.

## 2010-06-11 NOTE — Patient Instructions (Signed)
Stop the calcium supplements temporarily incase adding to sore throat.  Increase calcium content of foods.  Increase ranitidine to 300 mg at night incase reflux contributing If not getting better in 2-3 weeks call and we can do EnT referral. Verapamil has been associated with poss  aggravation of some reflux.

## 2010-06-11 NOTE — Progress Notes (Signed)
  Subjective:    Patient ID: Brianna Harper, female    DOB: March 19, 1946, 65 y.o.   MRN: [id number]  HPI  the patient comes in today for acute visit for problem with sore throat when she swallows off and on since the end of January. She's had no fever chest pain nasal drainage and congestion although she has had some colds in January. She has been trying to wean her Nexium to  Ranitidine to avoid metabolic affects of malabsorption with Nexium. She has not had recurrence of her reflux however she wonders if the sore throat could be related. She did not get the sore throat every day but more recently has been severe not related to time of day or swallowing certain foods or pills. She is on Nexium wean and ranitidine 150 mg twice a day.   Review of Systems  no cough weight loss fever or strep exposure. No history of allergies or congestion denies dry environment at home.    Objective:   Physical Exam  well-developed well-nourished in no acute distress. HEENT: Normocephalic ;atraumatic , Eyes;  PERRL, EOMs  Full, lids and conjunctiva clear,,Ears: no deformities, canals nl, TM landmarks normal, Nose: no deformity or discharge  Mouth : OP clear without lesion or edema minimal erythema no pnd seen  Neck no adenopathy or swelling Chest:  Clear to A&P without wheezes rales or rhonchi CV:  S1-S2 no gallops or murmurs peripheral perfusion is normal  .   Centricity  chart reviewed   Assessment & Plan:   Sore throat unclear etiology.  Discussed differential diagnosis at length. We'll increase her ranitidine she can try over-the-counter Claritin plain in the meantime and we will get any ear nose and throat consult if persistent and progressive.   GERD:   Trying to wean from the Nexium because of concern about metabolic problems it is unclear whether this is related to her sore throat discussed options. Her lifestyle appears to be healthy otherwise for her reflux reduction.  More than 50% of visit  Was  spent in counseling  25 minutes

## 2010-06-17 ENCOUNTER — Other Ambulatory Visit: Payer: Self-pay | Admitting: Internal Medicine

## 2010-06-17 DIAGNOSIS — IMO0001 Reserved for inherently not codable concepts without codable children: Secondary | ICD-10-CM

## 2010-06-17 MED ORDER — ESOMEPRAZOLE MAGNESIUM 40 MG PO CPDR: 40.0000 mg | DELAYED_RELEASE_CAPSULE | Freq: Every day | ORAL | Status: DC

## 2010-06-17 NOTE — Telephone Encounter (Signed)
I do not think she is going to tolerate being off PPI (Nexium has been best for her). Would go back on that 40 mg daily and take 2 ranidtidine 150 mg at bedtime for a while also until she gets back under control and then can try to come off the ranitidine.

## 2010-06-17 NOTE — Telephone Encounter (Signed)
Addended by: Graciella Freer on: 06/17/2010 04:30 PM   Modules accepted: Orders

## 2010-06-17 NOTE — Telephone Encounter (Signed)
Notified pt that Dr Leone Payor would like for her to start back on the Nexium qam and 2 Zantac qhs until her problem is cleared and will will try to wean her off again. Left samples-15- at the front desk. Pt will call for further problems and when better.

## 2010-06-17 NOTE — Telephone Encounter (Signed)
Per Dr Marvell Fuller 05/19/10 note, pt was tapering off Nexium and now she's taking Zantac 150mg  bid. Pt's problem is at night; she's having a terrible time and almost has to sleep sitting upright d/t the acid. Pt has tried Pantoprazole also. What she wants to know is should she take the 2 tabs only at night to see if that works or 1 in am and 2 in pm?

## 2010-06-17 NOTE — Telephone Encounter (Signed)
LMOM for pt to return our call.  

## 2010-06-17 NOTE — Telephone Encounter (Signed)
See documentation above yours

## 2010-06-26 ENCOUNTER — Other Ambulatory Visit: Payer: Self-pay

## 2010-06-30 ENCOUNTER — Other Ambulatory Visit (INDEPENDENT_AMBULATORY_CARE_PROVIDER_SITE_OTHER): Payer: PRIVATE HEALTH INSURANCE | Admitting: Internal Medicine

## 2010-06-30 DIAGNOSIS — E785 Hyperlipidemia, unspecified: Secondary | ICD-10-CM

## 2010-06-30 DIAGNOSIS — Z0000 Encounter for general adult medical examination without abnormal findings: Secondary | ICD-10-CM

## 2010-06-30 LAB — POCT URINALYSIS DIPSTICK
Glucose, UA: NEGATIVE
Ketones, UA: NEGATIVE
Protein, UA: NEGATIVE
Spec Grav, UA: 1.005
Urobilinogen, UA: 0.2

## 2010-06-30 LAB — CBC WITH DIFFERENTIAL/PLATELET
Basophils Relative: 0.8 % (ref 0.0–3.0)
Eosinophils Absolute: 0.1 10*3/uL (ref 0.0–0.7)
Eosinophils Relative: 1.7 % (ref 0.0–5.0)
HCT: 40.6 % (ref 36.0–46.0)
Lymphs Abs: 1.3 10*3/uL (ref 0.7–4.0)
MCHC: 34.3 g/dL (ref 30.0–36.0)
MCV: 94.1 fl (ref 78.0–100.0)
Monocytes Absolute: 0.3 10*3/uL (ref 0.1–1.0)
Neutro Abs: 2.7 10*3/uL (ref 1.4–7.7)
RBC: 4.31 Mil/uL (ref 3.87–5.11)
WBC: 4.5 10*3/uL (ref 4.5–10.5)

## 2010-07-01 LAB — BASIC METABOLIC PANEL
BUN: 17 mg/dL (ref 6–23)
CO2: 29 mEq/L (ref 19–32)
Chloride: 104 mEq/L (ref 96–112)
Potassium: 4.7 mEq/L (ref 3.5–5.1)

## 2010-07-01 LAB — TSH: TSH: 3.46 u[IU]/mL (ref 0.35–5.50)

## 2010-07-01 LAB — LDL CHOLESTEROL, DIRECT: Direct LDL: 143 mg/dL

## 2010-07-01 LAB — LIPID PANEL
Cholesterol: 245 mg/dL — ABNORMAL HIGH (ref 0–200)
Triglycerides: 103 mg/dL (ref 0.0–149.0)

## 2010-07-01 LAB — HEPATIC FUNCTION PANEL
ALT: 14 U/L (ref 0–35)
Albumin: 4.4 g/dL (ref 3.5–5.2)
Total Protein: 6.5 g/dL (ref 6.0–8.3)

## 2010-07-07 ENCOUNTER — Encounter: Payer: Self-pay | Admitting: Internal Medicine

## 2010-07-07 ENCOUNTER — Ambulatory Visit (INDEPENDENT_AMBULATORY_CARE_PROVIDER_SITE_OTHER): Payer: PRIVATE HEALTH INSURANCE | Admitting: Internal Medicine

## 2010-07-07 VITALS — BP 120/80 | HR 78 | Ht 65.5 in | Wt 135.0 lb

## 2010-07-07 DIAGNOSIS — Z0000 Encounter for general adult medical examination without abnormal findings: Secondary | ICD-10-CM

## 2010-07-07 DIAGNOSIS — E785 Hyperlipidemia, unspecified: Secondary | ICD-10-CM

## 2010-07-07 DIAGNOSIS — M899 Disorder of bone, unspecified: Secondary | ICD-10-CM

## 2010-07-07 DIAGNOSIS — M949 Disorder of cartilage, unspecified: Secondary | ICD-10-CM

## 2010-07-07 DIAGNOSIS — K219 Gastro-esophageal reflux disease without esophagitis: Secondary | ICD-10-CM

## 2010-07-07 DIAGNOSIS — J029 Acute pharyngitis, unspecified: Secondary | ICD-10-CM

## 2010-07-07 DIAGNOSIS — R070 Pain in throat: Secondary | ICD-10-CM

## 2010-07-07 NOTE — Progress Notes (Signed)
Subjective:    Patient ID: Brianna Harper, female    DOB: 1945-11-29, 65 y.o.   MRN: [id number]  HPI Comes in today for preventive visit  And fu medical issues  .  No change in family med hx except sore throat continuing from last visit ?   Mild  Nasal burning at times.. nofever or congestion or sneezing .  She did go back on the Nexium after calling Gi   With the concern of rebound from stopping the Nexium even though she is trying to switch over to Zantac  She is unsure her for Sore throat could be from this She stopped the lovastatin Because of concerns of possible side effects such as muscle aches. She's not had significant problems with her migraine headaches. Hypertension controlled Review of Systems ocass leg cramps.  Has gyne dr Carlis Abbott in [place].  Negative or chest pain shortness of breath unusual bleeding major changes in vision and hearing. She's had no major changes in her medication.  SEE HPI    Past Medical History  Diagnosis Date  . Anxiety   . Depression   . Hyperlipidemia   . GERD (gastroesophageal reflux disease)   . Osteopenia     dexa -2.0 hip 2008  . Migraine   . Rapid heart rate     hx of   . PMS (premenstrual syndrome)    History reviewed. No pertinent past surgical history.  reports that she has never smoked. She does not have any smokeless tobacco history on file. She reports that she does not drink alcohol or use illicit drugs. family history includes Arthritis in her father; Cancer in her maternal grandmother; and Hypertension in her mother. Allergies  Allergen Reactions  . Erythromycin Ethylsuccinate     REACTION: GI upset  . Nitrofurantoin   . Sulfadiazine     REACTION: rash       Objective:   Physical Exam Physical Exam  Vital signs reviewed WUJ:WJXB is a well-developed well-nourished alert cooperative  white female who appears her stated age in no acute distress.  HEENT: normocephalic  traumatic , Eyes: PERRL EOM's full, conjunctiva  clear, Nares: paten,t no deformity discharge or tenderness., Ears: no deformity EAC's clear TMs with normal landmarks. Mouth: clear OP, no lesions, edema. Minimal redness    Moist mucous membranes. Dentition in adequate repair. NECK: supple without masses, thyromegaly or bruits. CHEST/PULM:  Clear to auscultation and percussion breath sounds equal no wheeze , rales or rhonchi. No chest wall deformities or tenderness. Breast: normal by inspection . No dimpling, discharge, masses, tenderness or discharge . LN: no cervical axillary inguinal adenopathy  CV: PMI is nondisplaced, S1 S2 no gallops, murmurs, rubs. Peripheral pulses are full without delay.No JVD .  ABDOMEN: Bowel sounds normal nontender  No guard or rebound, no hepato splenomegal no CVA tenderness.  No hernia. Extremtities:  No clubbing cyanosis or edema, no acute joint swelling or redness no focal atrophy NEURO:  Oriented x3, cranial nerves 3-12 appear to be intact, no obvious focal weakness,gait within normal limits no abnormal reflexes or asymmetrical SKIN: No acute rashes normal turgor, color, no bruising or petechiae. PSYCH: Oriented, good eye contact, no obvious depression anxiety, cognition and judgment appear normal.      Assessment & Plan:  Preventive Health Care She will hold off on   The zostavax  until she is at [facility] she has a high deductible. HT Sore throat  This really doesn't appear to be infection unsure if it  could be related to reflux acid disease. However the severity of persistence of her symptoms I recommend she getting your nose and throat evaluation [facility]  Will check into her last dexA  scan and see when she is due she has had no fractures. Hyperlipidemia;  Discussed pros and cons of medication possible side effects risk-benefit okay to continue off of medication for now based on her numbers we will follow she will continue maintaining her weight loss and intensify lifestyle interventions  gerd   LEG  CRAMPS  Consider  Mg supplementation

## 2010-07-07 NOTE — Patient Instructions (Signed)
Add magnesium to your calcium supplement Ok to stay off lovastatin and do life style intervention for your cholesterol. I rec you see ENT  Doctor about the continued sore throat  . Otherwise hard to advise  Treatment . Still could be reflux.  Will review  Previous electronic record to get you some advice on  The bone density scan . Continue  Blood pressure meds .

## 2010-07-12 ENCOUNTER — Encounter: Payer: Self-pay | Admitting: Internal Medicine

## 2010-07-12 NOTE — Assessment & Plan Note (Signed)
Okay to stay off the lovastatin for now and will monitor discussed LDL goals

## 2010-07-12 NOTE — Assessment & Plan Note (Signed)
Due for dexa in 2013

## 2010-07-12 NOTE — Assessment & Plan Note (Signed)
Problematic recurrent unclear cause of the last couple months. Possibly worse after trying to wean off the Nexium. Seems a bit severe for reflux but will get ENT good evaluation to check.  For other pathology.

## 2010-07-27 ENCOUNTER — Other Ambulatory Visit: Payer: Self-pay | Admitting: Internal Medicine

## 2010-12-01 ENCOUNTER — Other Ambulatory Visit: Payer: Self-pay | Admitting: Internal Medicine

## 2010-12-01 DIAGNOSIS — IMO0001 Reserved for inherently not codable concepts without codable children: Secondary | ICD-10-CM

## 2010-12-01 MED ORDER — ESOMEPRAZOLE MAGNESIUM 40 MG PO CPDR: 40.0000 mg | DELAYED_RELEASE_CAPSULE | Freq: Every day | ORAL | Status: DC

## 2010-12-01 NOTE — Telephone Encounter (Signed)
Error refilling medication, ordered as a sample. Medication reordered.

## 2010-12-01 NOTE — Telephone Encounter (Signed)
Addended by: Tedra Senegal A on: 12/01/2010 10:37 AM   Modules accepted: Orders

## 2010-12-01 NOTE — Telephone Encounter (Signed)
Med refilled until appointment

## 2010-12-02 ENCOUNTER — Other Ambulatory Visit: Payer: Self-pay | Admitting: Internal Medicine

## 2010-12-02 DIAGNOSIS — IMO0001 Reserved for inherently not codable concepts without codable children: Secondary | ICD-10-CM

## 2010-12-02 MED ORDER — ESOMEPRAZOLE MAGNESIUM 40 MG PO CPDR: 40.0000 mg | DELAYED_RELEASE_CAPSULE | Freq: Every day | ORAL | Status: DC

## 2010-12-02 NOTE — Telephone Encounter (Signed)
Patient called question if she could get samples until her appointment 01/06/11. Samples left at the front dest for the patient to pick up.

## 2010-12-22 LAB — URINALYSIS, ROUTINE W REFLEX MICROSCOPIC
Bilirubin Urine: NEGATIVE
Ketones, ur: NEGATIVE
Leukocytes, UA: NEGATIVE
Nitrite: POSITIVE — AB
Protein, ur: NEGATIVE
Urobilinogen, UA: 1
pH: 6.5

## 2010-12-22 LAB — URINE CULTURE
Colony Count: NO GROWTH
Culture: NO GROWTH

## 2010-12-22 LAB — URINE MICROSCOPIC-ADD ON

## 2011-01-01 ENCOUNTER — Ambulatory Visit (INDEPENDENT_AMBULATORY_CARE_PROVIDER_SITE_OTHER): Payer: PRIVATE HEALTH INSURANCE

## 2011-01-01 DIAGNOSIS — Z23 Encounter for immunization: Secondary | ICD-10-CM

## 2011-01-04 ENCOUNTER — Telehealth: Payer: Self-pay | Admitting: *Deleted

## 2011-01-04 NOTE — Telephone Encounter (Signed)
Dr. Fabian Sharp- Dr. Forde Radon.

## 2011-01-04 NOTE — Telephone Encounter (Signed)
Pt would like to have the name of a Podiatrist from Dr. Fabian Sharp.  She does not need an official referral.  She has pain off and on across her toes on her right foot x 2 years.

## 2011-01-05 NOTE — Telephone Encounter (Signed)
Pt aware of this. 

## 2011-01-06 ENCOUNTER — Encounter: Payer: Self-pay | Admitting: Internal Medicine

## 2011-01-06 ENCOUNTER — Ambulatory Visit (INDEPENDENT_AMBULATORY_CARE_PROVIDER_SITE_OTHER): Payer: PRIVATE HEALTH INSURANCE | Admitting: Internal Medicine

## 2011-01-06 VITALS — BP 112/78 | HR 87 | Ht 66.0 in | Wt 136.0 lb

## 2011-01-06 DIAGNOSIS — K219 Gastro-esophageal reflux disease without esophagitis: Secondary | ICD-10-CM

## 2011-01-06 MED ORDER — OMEPRAZOLE 40 MG PO CPDR: 40.0000 mg | DELAYED_RELEASE_CAPSULE | Freq: Every day | ORAL | Status: DC

## 2011-01-06 NOTE — Patient Instructions (Signed)
Nexium was discontinued and Omeprazole was started. Your prescription(s) has(have) been sent to your pharmacy for you to pick up. Start a Calcium Supplement daily. Follow-up as needed.

## 2011-01-06 NOTE — Progress Notes (Signed)
  Subjective:    Patient ID: Brianna Harper, female    DOB: 1945-09-21, 65 y.o.   MRN: [id number]  HPI this pleasant lady returns with her husband to discuss her acid reflux problems. She has tried to wean off and stop PPI therapy multiple times over the past several years. She has never really been able to do this and learned that again. She tried ranitidine therapy but it really did not control her heartburn and sore throat symptoms. She has been back on Nexium with control of her symptoms overall low there is still occasional feel more sore throat feeling that is relieved with drinking liquid in the morning, rather promptly. She is concerned over calcium absorption and some bone loss and she she's had and the possible relationship of PPI therapy. This is due to her from taking a chronic PPI. She's not describing nausea vomiting or dysphagia or any abdominal pain at this time. She takes Nexium at 4 PM, before supper and finds that that has been helping. We have provided some samples recently, for which she is grateful.  Review of Systems As above.    Objective:   Physical Exam Well-nourished no acute distress       Assessment & Plan:

## 2011-01-06 NOTE — Assessment & Plan Note (Addendum)
She needs a PPI therapy on a regular basis. This has been proven over the years when she stops and even tries H2 blockers. It simply isn't enough. We spent a great deal of time talking about the need for this and to control her symptoms and improve her quality of life. She is very concerned about chronic PPI use and bone loss. She does not really have osteopenia based upon last bone density study in 2011, and I tried to reassure her. There may be some cause and effect with PPI therapy and bone loss but we really don't know for sure. She should take a calcium supplement and will do so. She is advised to ask her primary care physician or gynecologist about the type and amount. Some literature suggests calcium citrate is the best on chronic acid suppression though there is not a great body of literature on that.  We'll try a generic, using omeprazole 40 mg daily at this point. 20 minutes of time spent with the patient her husband reviewing things today.

## 2011-01-25 ENCOUNTER — Other Ambulatory Visit: Payer: Self-pay | Admitting: Internal Medicine

## 2011-03-31 ENCOUNTER — Telehealth: Payer: Self-pay | Admitting: Family Medicine

## 2011-03-31 NOTE — Telephone Encounter (Signed)
Triage Record Num: [id number] Operator: Caswell Corwin Patient Name: Brianna Harper Call Date & Time: 03/30/2011 4:18:41PM Patient Phone: [phone] PCP: Patient Gender: Female PCP Fax : Patient DOB: [birth year] [facility] Name: [facility] Reason for Call: Caller: [name]/Patient; PCP: Madelin Headings.; CB#: [phone]; Call regarding Urinary Pain/Bleeding SX started this AM. Just has lower pelvic pressure. Triaged Urinary Pain and last voided at 1520. Needs to be seen in 24 hrs and no S/O for this. Pt inst needs to be sen at U/C or E/R in the next 24hrs. Pt insists on speaking with someone else, states "they have called it in before for me." Paged Dr. Darryll Capers and then called cell and message left. Inst to call in Cipro 250 mg 1 PO BID x 5 days QS with no Rf and if SX not better will have to go to an U/C. [facility] [place] , [place] @ [phone] and spoke with Ramone. Protocol(s) Used: Urinary Symptoms - Female Recommended Outcome per Protocol: See Provider within 24 hours Reason for Outcome: Genital itching, burning or redness Care Advice: Take showers rather than baths. Do not use bubble baths or bath oils. Do not douche. Avoid feminine hygiene sprays or deodorants. ~ ~ Keep perineum clean and dry. Keep the area around the genitals clean using nonbacterial, nonperfumed mild soap (Dove for sensitive skin, Neutrogena, Pears or Basis) or no soap using just warm water. ~ Apply cool compresses to affected area(s) for 20 minutes 4 to 6 times daily to help relieve itching or try a baking soda sitz bath with 4-5 tablespoons of baking soda in enough water to cover the genital area. ~ ~ CAUTIONS Most adults need to drink 6-10 eight-ounce glasses (1.2-2.0 liters) of fluids per day unless previously told to limit fluid intake for other medical reasons. Limit fluids that contain caffeine, sugar or alcohol. Urine will be a very light yellow color when you drink enough  fluids. ~ 01/

## 2011-04-05 ENCOUNTER — Other Ambulatory Visit (INDEPENDENT_AMBULATORY_CARE_PROVIDER_SITE_OTHER): Payer: PRIVATE HEALTH INSURANCE

## 2011-04-05 ENCOUNTER — Telehealth: Payer: Self-pay | Admitting: *Deleted

## 2011-04-05 DIAGNOSIS — N390 Urinary tract infection, site not specified: Secondary | ICD-10-CM

## 2011-04-05 LAB — POCT URINALYSIS DIPSTICK
Ketones, UA: NEGATIVE
Protein, UA: NEGATIVE
Spec Grav, UA: 1.015
Urobilinogen, UA: 0.2
pH, UA: 5

## 2011-04-05 NOTE — Telephone Encounter (Signed)
Pt is better after being treated for UTI last week, but still has some dysuria and wants to ask Dr. Fabian Sharp if she can bring an Urine in or see her???  She is much better, but just this residual stinging when urinating.  Cipro called in last week for 5 days from this office.

## 2011-04-05 NOTE — Telephone Encounter (Signed)
Pt brought ua today and will see Dr. Fabian Sharp today.

## 2011-04-05 NOTE — Telephone Encounter (Signed)
Left message for pt to call back for appt tomorrow.

## 2011-04-05 NOTE — Telephone Encounter (Signed)
Can do ov tomorrow and check it then .

## 2011-04-06 ENCOUNTER — Encounter: Payer: Self-pay | Admitting: Internal Medicine

## 2011-04-06 ENCOUNTER — Ambulatory Visit (INDEPENDENT_AMBULATORY_CARE_PROVIDER_SITE_OTHER): Payer: PRIVATE HEALTH INSURANCE | Admitting: Internal Medicine

## 2011-04-06 VITALS — BP 140/80 | HR 86 | Temp 98.3°F | Wt 139.0 lb

## 2011-04-06 DIAGNOSIS — Z87440 Personal history of urinary (tract) infections: Secondary | ICD-10-CM

## 2011-04-06 DIAGNOSIS — R3915 Urgency of urination: Secondary | ICD-10-CM | POA: Insufficient documentation

## 2011-04-06 NOTE — Patient Instructions (Signed)
Urine is clear   Avoid bladder irritants If recurs come for urine culture and then treat as appropriate.

## 2011-04-06 NOTE — Progress Notes (Signed)
  Subjective:    Patient ID: Brianna Harper, female    DOB: [birth year], 65 y.o.   MRN: [id number]  HPI Patient comes in today as a followup of her urinary tract infection treatment that was done over the phone treated with Cipro for 5 days. 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 She is finished her medication for a few days but still has a small amount oflower ab pressure and urgency   Area.  No fever chills she is a lot better but just wants to make sure it has resolved.  Some back pain but this ends to be chronic and not related to her UTI. Last uti about a year ago. Remote hx of urolologist  In the past   Has used   On  tmp ic prolphylaxis.   History of swelling with Macrobid and a rash with sulfa many years ago.  Review of Systems Negative for chest pain shortness of breath fever hematuria nausea vomiting diarrhea at this point.    Objective:   Physical Exam  wdwn in nad  Looks well  Abdomen:  Sof,t normal bowel sounds without hepatosplenomegaly, no guarding rebound or masses no CVA tenderness UA is clear today       Assessment & Plan:  S/p uti rx   On prophylaxis  No cx was able to be done   Residual  mild sx    But nl ua   Will observe and avoid irritacnts disc cranberry  Lukewarm data on this as a help.   If recurs call for urine and culture and then can re treat  ( if no fever etc )  Is all to macrobid and ? Remote hx of rash with a sulfa med    . Limits med options.

## 2011-04-21 ENCOUNTER — Ambulatory Visit: Payer: PRIVATE HEALTH INSURANCE | Admitting: Internal Medicine

## 2011-04-27 ENCOUNTER — Other Ambulatory Visit: Payer: Self-pay | Admitting: Internal Medicine

## 2011-04-27 DIAGNOSIS — Z1231 Encounter for screening mammogram for malignant neoplasm of breast: Secondary | ICD-10-CM

## 2011-05-12 ENCOUNTER — Other Ambulatory Visit: Payer: Self-pay | Admitting: Internal Medicine

## 2011-06-08 ENCOUNTER — Ambulatory Visit
Admission: RE | Admit: 2011-06-08 | Discharge: 2011-06-08 | Disposition: A | Discharge status: Home / Self Care | Payer: PRIVATE HEALTH INSURANCE | Source: Ambulatory Visit | Attending: Internal Medicine | Admitting: Internal Medicine

## 2011-06-08 DIAGNOSIS — Z1231 Encounter for screening mammogram for malignant neoplasm of breast: Secondary | ICD-10-CM

## 2011-07-01 ENCOUNTER — Telehealth: Payer: Self-pay

## 2011-07-01 MED ORDER — CIPROFLOXACIN HCL 250 MG PO TABS: 250.0000 mg | ORAL_TABLET | Freq: Two times a day (BID) | ORAL | Status: AC

## 2011-07-01 NOTE — Telephone Encounter (Signed)
Thi happened in January also and we still do not have a urine culture .  But since she self treated we do not have a good idea what is foin on.   Can rx cipro 250 bid for 3 days disp 6   But in the future we need a UA and culture done before any more treatments are done.

## 2011-07-01 NOTE — Telephone Encounter (Signed)
Rx sent to pharmacy.  Pt's husband aware.

## 2011-07-01 NOTE — Telephone Encounter (Signed)
Triage VM:  Pt states she has UTI symptoms this morning.  Pt states she had the same occurrence in Jan 2013 and pt states a 5 day supply of Cipro was sent to her pharmacy.  Pt states she is not able to come in today due to starting a new job.  Pt states she had a 250 mg Cipro and pt states it has helped.  Pt states she is using [facility].  Pt has a cpx coming up on 07/12/11.  Pls advise.

## 2011-07-05 ENCOUNTER — Other Ambulatory Visit (INDEPENDENT_AMBULATORY_CARE_PROVIDER_SITE_OTHER): Payer: PRIVATE HEALTH INSURANCE

## 2011-07-05 DIAGNOSIS — Z0000 Encounter for general adult medical examination without abnormal findings: Secondary | ICD-10-CM

## 2011-07-05 LAB — LIPID PANEL: VLDL: 14.6 mg/dL (ref 0.0–40.0)

## 2011-07-05 LAB — CBC WITH DIFFERENTIAL/PLATELET
Basophils Absolute: 0 K/uL (ref 0.0–0.1)
Basophils Relative: 0.8 % (ref 0.0–3.0)
Eosinophils Absolute: 0.2 K/uL (ref 0.0–0.7)
Eosinophils Relative: 3.3 % (ref 0.0–5.0)
HCT: 42 % (ref 36.0–46.0)
Hemoglobin: 13.9 g/dL (ref 12.0–15.0)
Lymphocytes Relative: 37 % (ref 12.0–46.0)
Lymphs Abs: 1.8 K/uL (ref 0.7–4.0)
MCHC: 33.2 g/dL (ref 30.0–36.0)
MCV: 95.7 fl (ref 78.0–100.0)
Monocytes Absolute: 0.3 K/uL (ref 0.1–1.0)
Monocytes Relative: 7.1 % (ref 3.0–12.0)
Neutro Abs: 2.5 K/uL (ref 1.4–7.7)
Neutrophils Relative %: 51.8 % (ref 43.0–77.0)
Platelets: 234 K/uL (ref 150.0–400.0)
RBC: 4.39 Mil/uL (ref 3.87–5.11)
RDW: 13.9 % (ref 11.5–14.6)
WBC: 4.8 K/uL (ref 4.5–10.5)

## 2011-07-05 LAB — POCT URINALYSIS DIPSTICK
Bilirubin, UA: NEGATIVE
Blood, UA: NEGATIVE
Glucose, UA: NEGATIVE
Leukocytes, UA: NEGATIVE
Spec Grav, UA: >=1.03
Urobilinogen, UA: 0.2

## 2011-07-05 LAB — HEPATIC FUNCTION PANEL
AST: 18 U/L (ref 0–37)
Albumin: 4.6 g/dL (ref 3.5–5.2)
Alkaline Phosphatase: 49 U/L (ref 39–117)
Bilirubin, Direct: 0.1 mg/dL (ref 0.0–0.3)
Total Bilirubin: 0.8 mg/dL (ref 0.3–1.2)

## 2011-07-05 LAB — BASIC METABOLIC PANEL
CO2: 27 mEq/L (ref 19–32)
Calcium: 9.8 mg/dL (ref 8.4–10.5)
Creatinine, Ser: 1 mg/dL (ref 0.4–1.2)
GFR: 59.24 mL/min — ABNORMAL LOW (ref >60.00)
Glucose, Bld: 91 mg/dL (ref 70–99)
Sodium: 143 mEq/L (ref 135–145)

## 2011-07-12 ENCOUNTER — Ambulatory Visit (INDEPENDENT_AMBULATORY_CARE_PROVIDER_SITE_OTHER): Payer: PRIVATE HEALTH INSURANCE | Admitting: Internal Medicine

## 2011-07-12 ENCOUNTER — Encounter: Payer: Self-pay | Admitting: Internal Medicine

## 2011-07-12 VITALS — BP 112/70 | HR 85 | Temp 98.5°F | Ht 66.0 in | Wt 135.0 lb

## 2011-07-12 DIAGNOSIS — I10 Essential (primary) hypertension: Secondary | ICD-10-CM

## 2011-07-12 DIAGNOSIS — M899 Disorder of bone, unspecified: Secondary | ICD-10-CM

## 2011-07-12 DIAGNOSIS — Z0000 Encounter for general adult medical examination without abnormal findings: Secondary | ICD-10-CM

## 2011-07-12 DIAGNOSIS — Z87440 Personal history of urinary (tract) infections: Secondary | ICD-10-CM

## 2011-07-12 DIAGNOSIS — K5900 Constipation, unspecified: Secondary | ICD-10-CM

## 2011-07-12 DIAGNOSIS — G43909 Migraine, unspecified, not intractable, without status migrainosus: Secondary | ICD-10-CM

## 2011-07-12 DIAGNOSIS — E785 Hyperlipidemia, unspecified: Secondary | ICD-10-CM

## 2011-07-12 NOTE — Patient Instructions (Addendum)
Continue lifestyle intervention healthy eating and exercise . Ok to stop the verapamil trial and see   How your blood pressure and constipation does.  Continue the  diuiretic  As you are doing.  Can try decreasing   Calcium supplements  And get the rest in food. Continue vit d 800 - 1000 iu per day.   Can continue uti prophylaxis .    And if  get uti can take the TMP twice a day for 3-5 days . Get the otc  Urine check strips for inflammation cells.   Call if need meds for travel .   We may need to do cultures at times to decide on best treatment for utis.   Check on shingles vaccine and can make appt for this at any time.   ROV in about 6 months and we will check bp issues etc.

## 2011-07-12 NOTE — Progress Notes (Signed)
Subjective:    Patient ID: Brianna Harper, female    DOB: 05/07/1945, 66 y.o.   MRN: [id number]  HPI Patient comes in today for preventive visit and follow-up of medical issues. Update  history since  last visit: Has ongoing Constipation and stopped calcium cause of this .? What to do no blood or abd pain utd on colon has a Gi gessner No sig GERD and not on  Reg meds now BP doing well has lost a lot of weight over years and wonder if can dec meds. NO CP or sob  Healthy eating . HAs  Stable at this time uses maxalt prn.  REcurrent UTIs  Asks about  Open antibiotic rx and ability to send in ua if needed for infection. Or have med pre travel. No current uti sx .  ic prophylaxis etc ,  GYNE dr Primitivo Gauze [place]   Review of Systems ROS:  GEN/ HEENT: No fever, significant weight changes sweats headaches vision problems hearing changes, CV/ PULM; No chest pain shortness of breath cough, syncope,edema  change in exercise tolerance. GI /GU: No adominal pain, vomiting,. No blood in the stool. No significant GU symptoms. SKIN/HEME: ,no acute skin rashes suspicious lesions or bleeding. No lymphadenopathy, nodules, masses.  NEURO/ PSYCH:  No neurologic signs such as weakness numbness. No aggravation of mood ;depression anxiety. IMM/ Allergy: No unusual infections.  Allergy .   REST of 12 system review negative except as per HPI Past history family history social history reviewed in the electronic medical record.      Objective:   Physical Exam BP 112/70  Pulse 85  Temp(Src) 98.5 F (36.9 C) (Oral)  Ht 5\' 6"  (1.676 m)  Wt 135 lb (61.236 kg)  BMI 21.79 kg/m2  SpO2 97% Physical Exam: Vital signs reviewed NWG:NFAO is a well-developed well-nourished alert cooperative  white female who appears her stated age in no acute distress.  HEENT: normocephalic atraumatic , Eyes: PERRL EOM's full, conjunctiva clear, Nares: paten,t no deformity discharge or tenderness., Ears: no deformity EAC's clear TMs with  normal landmarks. Mouth: clear OP, no lesions, edema.  Moist mucous membranes. Dentition in adequate repair. NECK: supple without masses, thyromegaly or bruits. CHEST/PULM:  Clear to auscultation and percussion breath sounds equal no wheeze , rales or rhonchi. No chest wall deformities or tenderness. Breast: normal by inspection . No dimpling, discharge, masses, tenderness or discharge .  CV: PMI is nondisplaced, S1 S2 no gallops, murmurs, rubs. Peripheral pulses are full without delay.No JVD .  ABDOMEN: Bowel sounds normal nontender  No guard or rebound, no hepato splenomegal no CVA tenderness.  No hernia. Extremtities:  No clubbing cyanosis or edema, no acute joint swelling or redness no focal atrophy NEURO:  Oriented x3, cranial nerves 3-12 appear to be intact, no obvious focal weakness,gait within normal limits no abnormal reflexes or asymmetrical SKIN: No acute rashes normal turgor, color, no bruising or petechiae. PSYCH: Oriented, good eye contact, no obvious depression anxiety, cognition and judgment appear normal. LN: no cervical axillary inguinal adenopathy Wt Readings from Last 3 Encounters:  07/12/11 135 lb (61.236 kg)  04/06/11 139 lb (63.05 kg)  01/06/11 136 lb (61.689 kg)      Lab Results  Component Value Date   WBC 4.8 07/05/2011   HGB 13.9 07/05/2011   HCT 42.0 07/05/2011   PLT 234.0 07/05/2011   GLUCOSE 91 07/05/2011   CHOL 244* 07/05/2011   TRIG 73.0 07/05/2011   HDL 87.50 07/05/2011   LDLDIRECT 136.9  07/05/2011   LDLCALC 105* 12/05/2006   ALT 13 07/05/2011   AST 18 07/05/2011   NA 143 07/05/2011   K 4.9 07/05/2011   CL 106 07/05/2011   CREATININE 1.0 07/05/2011   BUN 22 07/05/2011   CO2 27 07/05/2011   TSH 4.50 07/05/2011        Assessment & Plan:  Preventive Health Care Counseled regarding healthy nutrition, exercise, sleep, injury prevention, calcium vit d and healthy weight .  Can get calcium in food and just take vit d if helps constipation.  Recurrent UTI   For now can use her  trimethoprim for prophy and bid for 3-4 days however may not work  If resistant germs  She has se of  2 antibiotics used for utis . ( septra and macrobid)  Call pre travel about meds  To use if gets uti  Out of town.    Otherwise call ahead  If getting sx and we can decide on getting ua an cultures as appropriate without OV . However if not a clear cut intervention then would need Office visit for quality of care and  Safety reasons.  Uses estrace prn this may help also  For prevention.  LIPIDS good ratio HT  Controlled reasonable to decrease meds with her hx of weight loss and see if controlled and just stay on the diuretic Migraines  Stable. GERD better  Constipation prob from meds and other  Disc  Strategies  Is utd on colon but can recheck with Dr Leone Payor if not controlled

## 2011-07-18 DIAGNOSIS — Z0000 Encounter for general adult medical examination without abnormal findings: Secondary | ICD-10-CM | POA: Insufficient documentation

## 2011-07-18 DIAGNOSIS — K5900 Constipation, unspecified: Secondary | ICD-10-CM | POA: Insufficient documentation

## 2011-07-18 DIAGNOSIS — I10 Essential (primary) hypertension: Secondary | ICD-10-CM | POA: Insufficient documentation

## 2012-01-12 ENCOUNTER — Ambulatory Visit: Payer: PRIVATE HEALTH INSURANCE

## 2012-01-14 ENCOUNTER — Ambulatory Visit: Payer: PRIVATE HEALTH INSURANCE

## 2012-01-14 DIAGNOSIS — H5200 Hypermetropia, unspecified eye: Secondary | ICD-10-CM | POA: Diagnosis not present

## 2012-01-14 DIAGNOSIS — H2510 Age-related nuclear cataract, unspecified eye: Secondary | ICD-10-CM | POA: Diagnosis not present

## 2012-01-14 DIAGNOSIS — H04129 Dry eye syndrome of unspecified lacrimal gland: Secondary | ICD-10-CM | POA: Diagnosis not present

## 2012-01-14 DIAGNOSIS — H524 Presbyopia: Secondary | ICD-10-CM | POA: Diagnosis not present

## 2012-01-18 ENCOUNTER — Ambulatory Visit (INDEPENDENT_AMBULATORY_CARE_PROVIDER_SITE_OTHER): Payer: Medicare Other

## 2012-01-18 DIAGNOSIS — Z23 Encounter for immunization: Secondary | ICD-10-CM | POA: Diagnosis not present

## 2012-01-26 ENCOUNTER — Other Ambulatory Visit: Payer: Self-pay | Admitting: Internal Medicine

## 2012-02-09 DIAGNOSIS — N952 Postmenopausal atrophic vaginitis: Secondary | ICD-10-CM | POA: Diagnosis not present

## 2012-02-09 DIAGNOSIS — Z124 Encounter for screening for malignant neoplasm of cervix: Secondary | ICD-10-CM | POA: Diagnosis not present

## 2012-02-09 DIAGNOSIS — M949 Disorder of cartilage, unspecified: Secondary | ICD-10-CM | POA: Diagnosis not present

## 2012-02-09 DIAGNOSIS — M899 Disorder of bone, unspecified: Secondary | ICD-10-CM | POA: Diagnosis not present

## 2012-04-03 ENCOUNTER — Ambulatory Visit (INDEPENDENT_AMBULATORY_CARE_PROVIDER_SITE_OTHER): Payer: Medicare Other | Admitting: Internal Medicine

## 2012-04-03 ENCOUNTER — Encounter: Payer: Self-pay | Admitting: Internal Medicine

## 2012-04-03 VITALS — BP 130/80 | HR 98 | Temp 97.8°F | Wt 132.0 lb

## 2012-04-03 DIAGNOSIS — J329 Chronic sinusitis, unspecified: Secondary | ICD-10-CM | POA: Diagnosis not present

## 2012-04-03 DIAGNOSIS — J988 Other specified respiratory disorders: Secondary | ICD-10-CM | POA: Diagnosis not present

## 2012-04-03 DIAGNOSIS — R42 Dizziness and giddiness: Secondary | ICD-10-CM | POA: Insufficient documentation

## 2012-04-03 DIAGNOSIS — J22 Unspecified acute lower respiratory infection: Secondary | ICD-10-CM

## 2012-04-03 MED ORDER — AMOXICILLIN 500 MG PO CAPS: 500.0000 mg | ORAL_CAPSULE | Freq: Three times a day (TID) | ORAL | Status: DC

## 2012-04-03 NOTE — Patient Instructions (Signed)
For now stop the  mucinex -D as this could keep you awake  And elevated heart rate .  Is does help sinus infections but can cause other symptoms .  Amoxicillin for sinus infection.    To help.    .  Saline nose spray.   Saline    Sinusitis Sinusitis is redness, soreness, and swelling (inflammation) of the paranasal sinuses. Paranasal sinuses are air pockets within the bones of your face (beneath the eyes, the middle of the forehead, or above the eyes). In healthy paranasal sinuses, mucus is able to drain out, and air is able to circulate through them by way of your nose. However, when your paranasal sinuses are inflamed, mucus and air can become trapped. This can allow bacteria and other germs to grow and cause infection. Sinusitis can develop quickly and last only a short time (acute) or continue over a long period (chronic). Sinusitis that lasts for more than 12 weeks is considered chronic.  CAUSES  Causes of sinusitis include:  Allergies.  Structural abnormalities, such as displacement of the cartilage that separates your nostrils (deviated septum), which can decrease the air flow through your nose and sinuses and affect sinus drainage.  Functional abnormalities, such as when the small hairs (cilia) that line your sinuses and help remove mucus do not work properly or are not present. SYMPTOMS  Symptoms of acute and chronic sinusitis are the same. The primary symptoms are pain and pressure around the affected sinuses. Other symptoms include:  Upper toothache.  Earache.  Headache.  Bad breath.  Decreased sense of smell and taste.  A cough, which worsens when you are lying flat.  Fatigue.  Fever.  Thick drainage from your nose, which often is green and may contain pus (purulent).  Swelling and warmth over the affected sinuses. DIAGNOSIS  Your caregiver will perform a physical exam. During the exam, your caregiver may:  Look in your nose for signs of abnormal growths in your  nostrils (nasal polyps).  Tap over the affected sinus to check for signs of infection.  View the inside of your sinuses (endoscopy) with a special imaging device with a light attached (endoscope), which is inserted into your sinuses. If your caregiver suspects that you have chronic sinusitis, one or more of the following tests may be recommended:  Allergy tests.  Nasal culture A sample of mucus is taken from your nose and sent to a lab and screened for bacteria.  Nasal cytology A sample of mucus is taken from your nose and examined by your caregiver to determine if your sinusitis is related to an allergy. TREATMENT  Most cases of acute sinusitis are related to a viral infection and will resolve on their own within 10 days. Sometimes medicines are prescribed to help relieve symptoms (pain medicine, decongestants, nasal steroid sprays, or saline sprays).  However, for sinusitis related to a bacterial infection, your caregiver will prescribe antibiotic medicines. These are medicines that will help kill the bacteria causing the infection.  Rarely, sinusitis is caused by a fungal infection. In theses cases, your caregiver will prescribe antifungal medicine. For some cases of chronic sinusitis, surgery is needed. Generally, these are cases in which sinusitis recurs more than 3 times per year, despite other treatments. HOME CARE INSTRUCTIONS   Drink plenty of water. Water helps thin the mucus so your sinuses can drain more easily.  Use a humidifier.  Inhale steam 3 to 4 times a day (for example, sit in the bathroom with the  shower running).  Apply a warm, moist washcloth to your face 3 to 4 times a day, or as directed by your caregiver.  Use saline nasal sprays to help moisten and clean your sinuses.  Take over-the-counter or prescription medicines for pain, discomfort, or fever only as directed by your caregiver. SEEK IMMEDIATE MEDICAL CARE IF:  You have increasing pain or severe  headaches.  You have nausea, vomiting, or drowsiness.  You have swelling around your face.  You have vision problems.  You have a stiff neck.  You have difficulty breathing. MAKE SURE YOU:   Understand these instructions.  Will watch your condition.  Will get help right away if you are not doing well or get worse. Document Released: 03/15/2005 Document Revised: 06/07/2011 Document Reviewed: 03/30/2011 [facility] Patient Information 2013 [place], [place].

## 2012-04-03 NOTE — Progress Notes (Signed)
Chief Complaint  Patient presents with  . Nasal Congestion    Started on Thursday night.  Cough is productive.  States she almost passed out this morning.  Has had some cold sweats.  Has facial pain/sinus pain that does not go away.  Has been treating with Mucinex D and Motrin.  Marland Kitchen Cough  . Sore Throat  . Night Sweats    HPI: Patient comes in today for SDA for  new problem evaluation. She is here with her husband today who drove See above  About 5 days  And not left the house .  Onset ;like a bad cold  Sneezing and now having face pain.   Then today had    Feels weak and had to sit down. And had cold sweats.    Took  mucinex D the whole time  2 every 12 and motrin for headache .   Face  Is still  hurting some on the left..   Not helping.   Blowing nose a lot. Mucous clear and now  Thicker  Darker not bloody.  ROS: See pertinent positives and negatives per HPI. No chest pain shortness of breath or history of syncope some of her symptoms got worse after her shower no unusual bleeding no vomiting neurologic changes.  Past Medical History  Diagnosis Date  . Anxiety   . Depression   . Hyperlipidemia   . GERD (gastroesophageal reflux disease)   . Osteopenia     dexa -2.0 hip 2008  . Migraine   . Rapid heart rate     hx of   . PMS (premenstrual syndrome)     Family History  Problem Relation Age of Onset  . Hypertension Mother   . Arthritis Father   . Ovarian cancer Maternal Grandmother     History   Social History  . Marital Status: Married    Spouse Name: N/A    Number of Children: 2  . Years of Education: N/A   Occupational History  . retired    Social History Main Topics  . Smoking status: Never Smoker   . Smokeless tobacco: Never Used  . Alcohol Use: No  . Drug Use: No  . Sexually Active: None   Other Topics Concern  . None   Social History Narrative   HHof 2 Non smoker2 children  Also twins that did not survive birthMarried CB x 2   No pets     Outpatient  Encounter Prescriptions as of 04/03/2012  Medication Sig Dispense Refill  . amoxicillin (AMOXIL) 500 MG capsule Take 1 capsule (500 mg total) by mouth 3 (three) times daily.  30 capsule  0  . Calcium Carbonate-Vitamin D (CALTRATE 600+D) 600-400 MG-UNIT per tablet Take 2 tablets by mouth daily.        Marland Kitchen estradiol (ESTRACE) 0.1 MG/GM vaginal cream Place 2 g vaginally as directed.        . MULTIPLE VITAMIN PO Take by mouth.        . rizatriptan (MAXALT) 10 MG tablet Take 10 mg by mouth as needed. May repeat in 2 hours if needed       . triamterene-hydrochlorothiazide (MAXZIDE-25) 37.5-25 MG per tablet TAKE ONE-HALF TABLET BY MOUTH EVERY DAY  45 tablet  5  . trimethoprim (TRIMPEX) 100 MG tablet Take 1 tablet (100 mg total) by mouth as needed.  30 tablet  2  . verapamil (CALAN-SR) 240 MG CR tablet Take 240 mg by mouth at bedtime. 1/2 tab daily       .  verapamil (CALAN-SR) 240 MG CR tablet TAKE ONE TABLET BY MOUTH AT BEDTIME  30 tablet  2  . [DISCONTINUED] omeprazole (PRILOSEC) 40 MG capsule Take 1 capsule (40 mg total) by mouth daily. 30-60 minutes before supper  30 capsule  11    EXAM:  BP 130/80  Pulse 98  Temp 97.8 F (36.6 C) (Oral)  Wt 132 lb (59.875 kg)  SpO2 99%  There is no height on file to calculate BMI. Pulse rate standing 98 regular rhythm blood pressure 1:30 range. GENERAL: vitals reviewed and listed above, alert, oriented, appears well hydrated and in no acute distress looks tired nontoxic in congested. Cognitively intact  HEENT: Normocephalic ;atraumatic , Eyes;  PERRL, EOMs  Full, lids and conjunctiva clear,,Ears: no deformities, canals nl, TM landmarks normal, Nose: no deformity or discharge congested face left maxilla moderately tender no swelling noted  Mouth : OP clear without lesion or edema . Some cobblestoning noted  NECK: no obvious masses on inspection palpation no adenopathy  LUNGS: clear to auscultation bilaterally, no wheezes, rales or rhonchi, good air  movement  CV: HRRR, no clubbing cyanosis or  peripheral edema nl cap refill when standing a while felt tired and felt like she had to lay down. Neurologic her appears grossly nonfocal MS: moves all extremities without noticeable focal  abnormality  PSYCH: Oriented cooperative,   ASSESSMENT AND PLAN:  Discussed the following assessment and plan:  1. Sinusitis    Left maxillary pain could be viral but persistent symptoms and feels badly  2. Intermittent lightheadedness    Normal vitals except for elevated heart rate she has been taking Mucinex D. every 12 hours for 4-5 days this could affect her sleep and sx well being    also suspect side effects of the decongestant. Discussed use but not round-the-clock increase fluids rest over the next couple days contact us if not a lot better within 3-4 days although she will be coughing and congested.  -Patient advised to return or notify health care team  immediately if symptoms worsen or persist or new concerns arise.  Patient Instructions  For now stop the  mucinex -D as this could keep you awake  And elevated heart rate .  Is does help sinus infections but can cause other symptoms .  Amoxicillin for sinus infection.    To help.    .  Saline nose spray.   Saline    Sinusitis Sinusitis is redness, soreness, and swelling (inflammation) of the paranasal sinuses. Paranasal sinuses are air pockets within the bones of your face (beneath the eyes, the middle of the forehead, or above the eyes). In healthy paranasal sinuses, mucus is able to drain out, and air is able to circulate through them by way of your nose. However, when your paranasal sinuses are inflamed, mucus and air can become trapped. This can allow bacteria and other germs to grow and cause infection. Sinusitis can develop quickly and last only a short time (acute) or continue over a long period (chronic). Sinusitis that lasts for more than 12 weeks is considered chronic.  CAUSES  Causes  of sinusitis include:  Allergies.  Structural abnormalities, such as displacement of the cartilage that separates your nostrils (deviated septum), which can decrease the air flow through your nose and sinuses and affect sinus drainage.  Functional abnormalities, such as when the small hairs (cilia) that line your sinuses and help remove mucus do not work properly or are not present. SYMPTOMS  Symptoms of acute  and chronic sinusitis are the same. The primary symptoms are pain and pressure around the affected sinuses. Other symptoms include:  Upper toothache.  Earache.  Headache.  Bad breath.  Decreased sense of smell and taste.  A cough, which worsens when you are lying flat.  Fatigue.  Fever.  Thick drainage from your nose, which often is green and may contain pus (purulent).  Swelling and warmth over the affected sinuses. DIAGNOSIS  Your caregiver will perform a physical exam. During the exam, your caregiver may:  Look in your nose for signs of abnormal growths in your nostrils (nasal polyps).  Tap over the affected sinus to check for signs of infection.  View the inside of your sinuses (endoscopy) with a special imaging device with a light attached (endoscope), which is inserted into your sinuses. If your caregiver suspects that you have chronic sinusitis, one or more of the following tests may be recommended:  Allergy tests.  Nasal culture A sample of mucus is taken from your nose and sent to a lab and screened for bacteria.  Nasal cytology A sample of mucus is taken from your nose and examined by your caregiver to determine if your sinusitis is related to an allergy. TREATMENT  Most cases of acute sinusitis are related to a viral infection and will resolve on their own within 10 days. Sometimes medicines are prescribed to help relieve symptoms (pain medicine, decongestants, nasal steroid sprays, or saline sprays).  However, for sinusitis related to a bacterial  infection, your caregiver will prescribe antibiotic medicines. These are medicines that will help kill the bacteria causing the infection.  Rarely, sinusitis is caused by a fungal infection. In theses cases, your caregiver will prescribe antifungal medicine. For some cases of chronic sinusitis, surgery is needed. Generally, these are cases in which sinusitis recurs more than 3 times per year, despite other treatments. HOME CARE INSTRUCTIONS   Drink plenty of water. Water helps thin the mucus so your sinuses can drain more easily.  Use a humidifier.  Inhale steam 3 to 4 times a day (for example, sit in the bathroom with the shower running).  Apply a warm, moist washcloth to your face 3 to 4 times a day, or as directed by your caregiver.  Use saline nasal sprays to help moisten and clean your sinuses.  Take over-the-counter or prescription medicines for pain, discomfort, or fever only as directed by your caregiver. SEEK IMMEDIATE MEDICAL CARE IF:  You have increasing pain or severe headaches.  You have nausea, vomiting, or drowsiness.  You have swelling around your face.  You have vision problems.  You have a stiff neck.  You have difficulty breathing. MAKE SURE YOU:   Understand these instructions.  Will watch your condition.  Will get help right away if you are not doing well or get worse. Document Released: 03/15/2005 Document Revised: 06/07/2011 Document Reviewed: 03/30/2011 [facility] Patient Information 2013 [place], [place].      Neta Mends. Korra Christine M.D.

## 2012-05-01 ENCOUNTER — Encounter: Payer: Self-pay | Admitting: Internal Medicine

## 2012-05-01 ENCOUNTER — Ambulatory Visit (INDEPENDENT_AMBULATORY_CARE_PROVIDER_SITE_OTHER): Payer: Medicare Other | Admitting: Internal Medicine

## 2012-05-01 VITALS — BP 156/86 | HR 90 | Temp 97.9°F | Ht 65.75 in | Wt 137.0 lb

## 2012-05-01 DIAGNOSIS — I10 Essential (primary) hypertension: Secondary | ICD-10-CM | POA: Diagnosis not present

## 2012-05-01 DIAGNOSIS — G43909 Migraine, unspecified, not intractable, without status migrainosus: Secondary | ICD-10-CM

## 2012-05-01 DIAGNOSIS — Z139 Encounter for screening, unspecified: Secondary | ICD-10-CM | POA: Diagnosis not present

## 2012-05-01 DIAGNOSIS — Z87440 Personal history of urinary (tract) infections: Secondary | ICD-10-CM

## 2012-05-01 DIAGNOSIS — Z0000 Encounter for general adult medical examination without abnormal findings: Secondary | ICD-10-CM | POA: Diagnosis not present

## 2012-05-01 DIAGNOSIS — R252 Cramp and spasm: Secondary | ICD-10-CM | POA: Diagnosis not present

## 2012-05-01 DIAGNOSIS — Z23 Encounter for immunization: Secondary | ICD-10-CM | POA: Diagnosis not present

## 2012-05-01 DIAGNOSIS — M949 Disorder of cartilage, unspecified: Secondary | ICD-10-CM

## 2012-05-01 DIAGNOSIS — E785 Hyperlipidemia, unspecified: Secondary | ICD-10-CM | POA: Diagnosis not present

## 2012-05-01 DIAGNOSIS — M899 Disorder of bone, unspecified: Secondary | ICD-10-CM | POA: Diagnosis not present

## 2012-05-01 LAB — CBC WITH DIFFERENTIAL/PLATELET
Basophils Absolute: 0 10*3/uL (ref 0.0–0.1)
Basophils Relative: 1 % (ref 0.0–3.0)
Eosinophils Relative: 2.7 % (ref 0.0–5.0)
HCT: 40.1 % (ref 36.0–46.0)
Hemoglobin: 13.5 g/dL (ref 12.0–15.0)
Lymphs Abs: 1.2 10*3/uL (ref 0.7–4.0)
Monocytes Relative: 8.6 % (ref 3.0–12.0)
Neutro Abs: 2.9 10*3/uL (ref 1.4–7.7)
RBC: 4.29 Mil/uL (ref 3.87–5.11)
RDW: 13.5 % (ref 11.5–14.6)

## 2012-05-01 LAB — BASIC METABOLIC PANEL
GFR: 59.78 mL/min — ABNORMAL LOW (ref >60.00)
Glucose, Bld: 95 mg/dL (ref 70–99)
Potassium: 4.1 mEq/L (ref 3.5–5.1)
Sodium: 140 mEq/L (ref 135–145)

## 2012-05-01 LAB — LIPID PANEL: Cholesterol: 236 mg/dL — ABNORMAL HIGH (ref 0–200)

## 2012-05-01 LAB — HEPATIC FUNCTION PANEL
ALT: 9 U/L (ref 0–35)
AST: 17 U/L (ref 0–37)
Albumin: 4.6 g/dL (ref 3.5–5.2)
Total Protein: 7.1 g/dL (ref 6.0–8.3)

## 2012-05-01 LAB — MAGNESIUM: Magnesium: 2.1 mg/dL (ref 1.5–2.5)

## 2012-05-01 LAB — LDL CHOLESTEROL, DIRECT: Direct LDL: 109.8 mg/dL

## 2012-05-01 LAB — T4, FREE: Free T4: 0.76 ng/dL (ref 0.60–1.60)

## 2012-05-01 MED ORDER — VERAPAMIL HCL ER 180 MG PO TBCR: 180.0000 mg | EXTENDED_RELEASE_TABLET | Freq: Every day | ORAL | Status: DC

## 2012-05-01 MED ORDER — TRIMETHOPRIM 100 MG PO TABS: ORAL_TABLET | ORAL | Status: DC

## 2012-05-01 NOTE — Patient Instructions (Addendum)
Monitor your blood pressure at least 3 times a week until we know it is controlled. We'll change the verapamil to 180 mg a day. We can switch medications if needed to control blood pressure avoid salt increase exercise moderation alcohol.-  DASH diet. Will notify you  of labs when available. Get a bone density  Will notify of results and plan per results  Refill antibiotic for  Recurrent uti for travel and prophylaxis as discussed .  Check with  Coverage for shingles vaccine Zostavax.   ROV in 2 months about blood pressure control.      Preventive Care for Adults, Female A healthy lifestyle and preventive care can promote health and wellness. Preventive health guidelines for women include the following key practices.  A routine yearly physical is a good way to check with your caregiver about your health and preventive screening. It is a chance to share any concerns and updates on your health, and to receive a thorough exam.  Visit your dentist for a routine exam and preventive care every 6 months. Brush your teeth twice a day and floss once a day. Good oral hygiene prevents tooth decay and gum disease.  The frequency of eye exams is based on your age, health, family medical history, use of contact lenses, and other factors. Follow your caregiver's recommendations for frequency of eye exams.  Eat a healthy diet. Foods like vegetables, fruits, whole grains, low-fat dairy products, and lean protein foods contain the nutrients you need without too many calories. Decrease your intake of foods high in solid fats, added sugars, and salt. Eat the right amount of calories for you.Get information about a proper diet from your caregiver, if necessary.  Regular physical exercise is one of the most important things you can do for your health. Most adults should get at least 150 minutes of moderate-intensity exercise (any activity that increases your heart rate and causes you to sweat) each week. In  addition, most adults need muscle-strengthening exercises on 2 or more days a week.  Maintain a healthy weight. The body mass index (BMI) is a screening tool to identify possible weight problems. It provides an estimate of body fat based on height and weight. Your caregiver can help determine your BMI, and can help you achieve or maintain a healthy weight.For adults 20 years and older:  A BMI below 18.5 is considered underweight.  A BMI of 18.5 to 24.9 is normal.  A BMI of 25 to 29.9 is considered overweight.  A BMI of 30 and above is considered obese.  Maintain normal blood lipids and cholesterol levels by exercising and minimizing your intake of saturated fat. Eat a balanced diet with plenty of fruit and vegetables. Blood tests for lipids and cholesterol should begin at age 34 and be repeated every 5 years. If your lipid or cholesterol levels are high, you are over 50, or you are at high risk for heart disease, you may need your cholesterol levels checked more frequently.Ongoing high lipid and cholesterol levels should be treated with medicines if diet and exercise are not effective.  If you smoke, find out from your caregiver how to quit. If you do not use tobacco, do not start.  If you are pregnant, do not drink alcohol. If you are breastfeeding, be very cautious about drinking alcohol. If you are not pregnant and choose to drink alcohol, do not exceed 1 drink per day. One drink is considered to be 12 ounces (355 mL) of beer, 5 ounces (  148 mL) of wine, or 1.5 ounces (44 mL) of liquor.  Avoid use of street drugs. Do not share needles with anyone. Ask for help if you need support or instructions about stopping the use of drugs.  High blood pressure causes heart disease and increases the risk of stroke. Your blood pressure should be checked at least every 1 to 2 years. Ongoing high blood pressure should be treated with medicines if weight loss and exercise are not effective.  If you are 51  to 65 years old, ask your caregiver if you should take aspirin to prevent strokes.  Diabetes screening involves taking a blood sample to check your fasting blood sugar level. This should be done once every 3 years, after age 9, if you are within normal weight and without risk factors for diabetes. Testing should be considered at a younger age or be carried out more frequently if you are overweight and have at least 1 risk factor for diabetes.  Breast cancer screening is essential preventive care for women. You should practice "breast self-awareness." This means understanding the normal appearance and feel of your breasts and may include breast self-examination. Any changes detected, no matter how small, should be reported to a caregiver. Women in their 49s and 30s should have a clinical breast exam (CBE) by a caregiver as part of a regular health exam every 1 to 3 years. After age 54, women should have a CBE every year. Starting at age 37, women should consider having a mammography (breast X-ray test) every year. Women who have a family history of breast cancer should talk to their caregiver about genetic screening. Women at a high risk of breast cancer should talk to their caregivers about having magnetic resonance imaging (MRI) and a mammography every year.  The Pap test is a screening test for cervical cancer. A Pap test can show cell changes on the cervix that might become cervical cancer if left untreated. A Pap test is a procedure in which cells are obtained and examined from the lower end of the uterus (cervix).  Women should have a Pap test starting at age 47.  Between ages 6 and 83, Pap tests should be repeated every 2 years.  Beginning at age 60, you should have a Pap test every 3 years as long as the past 3 Pap tests have been normal.  Some women have medical problems that increase the chance of getting cervical cancer. Talk to your caregiver about these problems. It is especially  important to talk to your caregiver if a new problem develops soon after your last Pap test. In these cases, your caregiver may recommend more frequent screening and Pap tests.  The above recommendations are the same for women who have or have not gotten the vaccine for human papillomavirus (HPV).  If you had a hysterectomy for a problem that was not cancer or a condition that could lead to cancer, then you no longer need Pap tests. Even if you no longer need a Pap test, a regular exam is a good idea to make sure no other problems are starting.  If you are between ages 28 and 62, and you have had normal Pap tests going back 10 years, you no longer need Pap tests. Even if you no longer need a Pap test, a regular exam is a good idea to make sure no other problems are starting.  If you have had past treatment for cervical cancer or a condition that could lead to  cancer, you need Pap tests and screening for cancer for at least 20 years after your treatment.  If Pap tests have been discontinued, risk factors (such as a new sexual partner) need to be reassessed to determine if screening should be resumed.  The HPV test is an additional test that may be used for cervical cancer screening. The HPV test looks for the virus that can cause the cell changes on the cervix. The cells collected during the Pap test can be tested for HPV. The HPV test could be used to screen women aged 82 years and older, and should be used in women of any age who have unclear Pap test results. After the age of 36, women should have HPV testing at the same frequency as a Pap test.  Colorectal cancer can be detected and often prevented. Most routine colorectal cancer screening begins at the age of 65 and continues through age 44. However, your caregiver may recommend screening at an earlier age if you have risk factors for colon cancer. On a yearly basis, your caregiver may provide home test kits to check for hidden blood in the stool.  Use of a small camera at the end of a tube, to directly examine the colon (sigmoidoscopy or colonoscopy), can detect the earliest forms of colorectal cancer. Talk to your caregiver about this at age 1, when routine screening begins. Direct examination of the colon should be repeated every 5 to 10 years through age 8, unless early forms of pre-cancerous polyps or small growths are found.  Hepatitis C blood testing is recommended for all people born from [birth year] through 1964 and any individual with known risks for hepatitis C.  Practice safe sex. Use condoms and avoid high-risk sexual practices to reduce the spread of sexually transmitted infections (STIs). STIs include gonorrhea, chlamydia, syphilis, trichomonas, herpes, HPV, and human immunodeficiency virus (HIV). Herpes, HIV, and HPV are viral illnesses that have no cure. They can result in disability, cancer, and death. Sexually active women aged 27 and younger should be checked for chlamydia. Older women with new or multiple partners should also be tested for chlamydia. Testing for other STIs is recommended if you are sexually active and at increased risk.  Osteoporosis is a disease in which the bones lose minerals and strength with aging. This can result in serious bone fractures. The risk of osteoporosis can be identified using a bone density scan. Women ages 17 and over and women at risk for fractures or osteoporosis should discuss screening with their caregivers. Ask your caregiver whether you should take a calcium supplement or vitamin D to reduce the rate of osteoporosis.  Menopause can be associated with physical symptoms and risks. Hormone replacement therapy is available to decrease symptoms and risks. You should talk to your caregiver about whether hormone replacement therapy is right for you.  Use sunscreen with sun protection factor (SPF) of 30 or more. Apply sunscreen liberally and repeatedly throughout the day. You should seek shade when  your shadow is shorter than you. Protect yourself by wearing long sleeves, pants, a wide-brimmed hat, and sunglasses year round, whenever you are outdoors.  Once a month, do a whole body skin exam, using a mirror to look at the skin on your back. Notify your caregiver of new moles, moles that have irregular borders, moles that are larger than a pencil eraser, or moles that have changed in shape or color.  Stay current with required immunizations.  Influenza. You need a dose every fall (  or winter). The composition of the flu vaccine changes each year, so being vaccinated once is not enough.  Pneumococcal polysaccharide. You need 1 to 2 doses if you smoke cigarettes or if you have certain chronic medical conditions. You need 1 dose at age 25 (or older) if you have never been vaccinated.  Tetanus, diphtheria, pertussis (Tdap, Td). Get 1 dose of Tdap vaccine if you are younger than age 12, are over 65 and have contact with an infant, are a Research scientist (physical sciences), are pregnant, or simply want to be protected from whooping cough. After that, you need a Td booster dose every 10 years. Consult your caregiver if you have not had at least 3 tetanus and diphtheria-containing shots sometime in your life or have a deep or dirty wound.  HPV. You need this vaccine if you are a woman age 38 or younger. The vaccine is given in 3 doses over 6 months.  Measles, mumps, rubella (MMR). You need at least 1 dose of MMR if you were born in 1957 or later. You may also need a second dose.  Meningococcal. If you are age 69 to 48 and a first-year college student living in a residence hall, or have one of several medical conditions, you need to get vaccinated against meningococcal disease. You may also need additional booster doses.  Zoster (shingles). If you are age 51 or older, you should get this vaccine.  Varicella (chickenpox). If you have never had chickenpox or you were vaccinated but received only 1 dose, talk to your  caregiver to find out if you need this vaccine.  Hepatitis A. You need this vaccine if you have a specific risk factor for hepatitis A virus infection or you simply wish to be protected from this disease. The vaccine is usually given as 2 doses, 6 to 18 months apart.  Hepatitis B. You need this vaccine if you have a specific risk factor for hepatitis B virus infection or you simply wish to be protected from this disease. The vaccine is given in 3 doses, usually over 6 months. Preventive Services / Frequency Ages 58 and over  Blood pressure check.** / Every 1 to 2 years.  Lipid and cholesterol check.** / Every 5 years beginning at age 56.  Clinical breast exam.** / Every year after age 37.  Mammogram.** / Every year beginning at age 32 and continuing for as long as you are in good health. Consult with your caregiver.  Pap test.** / Every 3 years starting at age 32 through age 25 or 47 with a 3 consecutive normal Pap tests. Testing can be stopped between 65 and 70 with 3 consecutive normal Pap tests and no abnormal Pap or HPV tests in the past 10 years.  HPV screening.** / Every 3 years from ages 66 through ages 58 or 78 with a history of 3 consecutive normal Pap tests. Testing can be stopped between 65 and 70 with 3 consecutive normal Pap tests and no abnormal Pap or HPV tests in the past 10 years.  Fecal occult blood test (FOBT) of stool. / Every year beginning at age 75 and continuing until age 70. You may not need to do this test if you get a colonoscopy every 10 years.  Flexible sigmoidoscopy or colonoscopy.** / Every 5 years for a flexible sigmoidoscopy or every 10 years for a colonoscopy beginning at age 73 and continuing until age 50.  Hepatitis C blood test.** / For all people born from [birth year] through 1964 and  any individual with known risks for hepatitis C.  Osteoporosis screening.** / A one-time screening for women ages 15 and over and women at risk for fractures or  osteoporosis.  Skin self-exam. / Monthly.  Influenza immunization.** / Every year.  Pneumococcal polysaccharide immunization.** / 1 dose at age 65 (or older) if you have never been vaccinated.  Tetanus, diphtheria, pertussis (Tdap, Td) immunization. / A one-time dose of Tdap vaccine if you are over 65 and have contact with an infant, are a Research scientist (physical sciences), or simply want to be protected from whooping cough. After that, you need a Td booster dose every 10 years.  Varicella immunization.** / Consult your caregiver.  Meningococcal immunization.** / Consult your caregiver.  Hepatitis A immunization.** / Consult your caregiver. 2 doses, 6 to 18 months apart.  Hepatitis B immunization.** / Check with your caregiver. 3 doses, usually over 6 months. ** Family history and personal history of risk and conditions may change your caregiver's recommendations. Document Released: 05/11/2001 Document Revised: 06/07/2011 Document Reviewed: 08/10/2010 [facility] Patient Information 2013 [place], [place].  DASH Diet The DASH diet stands for "Dietary Approaches to Stop Hypertension." It is a healthy eating plan that has been shown to reduce high blood pressure (hypertension) in as little as 14 days, while also possibly providing other significant health benefits. These other health benefits include reducing the risk of breast cancer after menopause and reducing the risk of type 2 diabetes, heart disease, colon cancer, and stroke. Health benefits also include weight loss and slowing kidney failure in patients with chronic kidney disease.  DIET GUIDELINES  Limit salt (sodium). Your diet should contain less than 1500 mg of sodium daily.  Limit refined or processed carbohydrates. Your diet should include mostly whole grains. Desserts and added sugars should be used sparingly.  Include small amounts of heart-healthy fats. These types of fats include nuts, oils, and tub margarine. Limit saturated and trans fats.  These fats have been shown to be harmful in the body. CHOOSING FOODS  The following food groups are based on a 2000 calorie diet. See your Registered Dietitian for individual calorie needs. Grains and Grain Products (6 to 8 servings daily)  Eat More Often: Whole-wheat bread, brown rice, whole-grain or wheat pasta, quinoa, popcorn without added fat or salt (air popped).  Eat Less Often: White bread, white pasta, white rice, cornbread. Vegetables (4 to 5 servings daily)  Eat More Often: Fresh, frozen, and canned vegetables. Vegetables may be raw, steamed, roasted, or grilled with a minimal amount of fat.  Eat Less Often/Avoid: Creamed or fried vegetables. Vegetables in a cheese sauce. Fruit (4 to 5 servings daily)  Eat More Often: All fresh, canned (in natural juice), or frozen fruits. Dried fruits without added sugar. One hundred percent fruit juice ( cup [237 mL] daily).  Eat Less Often: Dried fruits with added sugar. Canned fruit in light or heavy syrup. [employer], Fish, and Poultry (2 servings or less daily. One serving is 3 to 4 oz [85-114 g]).  Eat More Often: Ninety percent or leaner ground beef, tenderloin, sirloin. Round cuts of beef, chicken breast, [place] breast. All fish. Grill, bake, or broil your meat. Nothing should be fried.  Eat Less Often/Avoid: Fatty cuts of meat, [place], or chicken leg, thigh, or wing. Fried cuts of meat or fish. Dairy (2 to 3 servings)  Eat More Often: Low-fat or fat-free milk, low-fat plain or light yogurt, reduced-fat or part-skim cheese.  Eat Less Often/Avoid: Milk (whole, 2%).Whole milk yogurt. Full-fat  cheeses. Nuts, Seeds, and Legumes (4 to 5 servings per week)  Eat More Often: All without added salt.  Eat Less Often/Avoid: Salted nuts and seeds, canned beans with added salt. Fats and Sweets (limited)  Eat More Often: Vegetable oils, tub margarines without trans fats, sugar-free gelatin. Mayonnaise and salad dressings.  Eat Less  Often/Avoid: Coconut oils, palm oils, butter, stick margarine, cream, half and half, cookies, candy, pie. FOR MORE INFORMATION The Dash Diet Eating Plan: www.[email or web address].org Document Released: 03/04/2011 Document Revised: 06/07/2011 Document Reviewed: 03/04/2011 [facility] Patient Information 2013 [place], [place].

## 2012-05-01 NOTE — Progress Notes (Signed)
Chief Complaint  Patient presents with  . Annual Exam    Medicare    HPI: Patient comes in today for Preventive Medicare wellness visit . Welcome to medicare  No major injuries, ed visits ,hospitalizations , new medications since last visit. Headaches : ok.  Stopped calcium  Vit d   For 3-4 years ago .    caclium constipation.  Using ic prophylaxis no recent uti BP had been decreasing med  [facility] 140 - 160 range   Hearing:  Good    Vision:  No limitations at present . Last eye check UTD .  Monitoring for family hx of macular  GBO  opthalmology  .  Safety:  Has smoke detector and wears seat belts.  No firearms. No excess sun exposure. Sees dentist regularly.  Falls:  No   Advance directive :  Reviewed  Has one.  Memory: Felt to be good  , no concern from her or her family.  Depression: No anhedonia unusual crying or depressive symptoms  Nutrition: Eats well balanced diet; ?adequate calcium and vitamin D. No swallowing chewing problems.    Injury: no major injuries in the last six months.  Other healthcare providers:  Reviewed today .  Social:  Lives with spouse married. No pets.   Preventive parameters: up-to-date  Reviewed   ADLS:   There are no problems or need for assistance  driving, feeding, obtaining food, dressing, toileting and bathing, managing money using phone. She is independent.  EXERCISE/ HABITS  Per week   ocass walk .    No tobacco    Etoh: ocass spec ocassional.    ROS:  GEN/ HEENT: No fever, significant weight changes sweats headaches vision problems hearing changes, CV/ PULM; No chest pain shortness of breath cough, syncope,edema  change in exercise tolerance. GI /GU: No adominal pain, vomiting, change in bowel habits. No blood in the stool. No significant GU symptoms. SKIN/HEME: ,no acute skin rashes suspicious lesions or bleeding. No lymphadenopathy, nodules, masses.  NEURO/ PSYCH:  No neurologic signs such as weakness numbness. No  depression anxiety. IMM/ Allergy: No unusual infections.  Allergy .   REST of 12 system review negative except as per HPI   Past Medical History  Diagnosis Date  . Anxiety   . Depression   . Hyperlipidemia   . GERD (gastroesophageal reflux disease)   . Osteopenia     dexa -2.0 hip 2008  . Migraine   . Rapid heart rate     hx of   . PMS (premenstrual syndrome)     Family History  Problem Relation Age of Onset  . Hypertension Mother   . Arthritis Father   . Ovarian cancer Maternal Grandmother     History   Social History  . Marital Status: Married    Spouse Name: N/A    Number of Children: 2  . Years of Education: N/A   Occupational History  . retired    Social History Main Topics  . Smoking status: Never Smoker   . Smokeless tobacco: Never Used  . Alcohol Use: No  . Drug Use: No  . Sexually Active: None   Other Topics Concern  . None   Social History Narrative   HHof 2 Non smoker2 children  Also twins that did not survive birthMarried CB x 2   No pets     Outpatient Encounter Prescriptions as of 05/01/2012  Medication Sig Dispense Refill  . estradiol (ESTRACE) 0.1 MG/GM vaginal cream  Place 2 g vaginally as directed.        . rizatriptan (MAXALT) 10 MG tablet Take 10 mg by mouth as needed. May repeat in 2 hours if needed       . triamterene-hydrochlorothiazide (MAXZIDE-25) 37.5-25 MG per tablet TAKE ONE-HALF TABLET BY MOUTH EVERY DAY  45 tablet  5  . trimethoprim (TRIMPEX) 100 MG tablet Take 1 po as directed for uti prevention  30 tablet  1  . verapamil (CALAN-SR) 180 MG CR tablet Take 1 tablet (180 mg total) by mouth at bedtime.  90 tablet  2  . [DISCONTINUED] trimethoprim (TRIMPEX) 100 MG tablet Take 1 tablet (100 mg total) by mouth as needed.  30 tablet  2  . [DISCONTINUED] verapamil (CALAN-SR) 240 MG CR tablet 1/2 tab daily      . [DISCONTINUED] amoxicillin (AMOXIL) 500 MG capsule Take 1 capsule (500 mg total) by mouth 3 (three) times daily.  30 capsule  0   . [DISCONTINUED] Calcium Carbonate-Vitamin D (CALTRATE 600+D) 600-400 MG-UNIT per tablet Take 2 tablets by mouth daily.        . [DISCONTINUED] MULTIPLE VITAMIN PO Take by mouth.        . [DISCONTINUED] verapamil (CALAN-SR) 240 MG CR tablet TAKE ONE TABLET BY MOUTH AT BEDTIME  30 tablet  2    EXAM:  BP 156/86  Pulse 90  Temp 97.9 F (36.6 C) (Oral)  Ht 5' 5.75" (1.67 m)  Wt 137 lb (62.143 kg)  BMI 22.28 kg/m2  SpO2 96%  Body mass index is 22.28 kg/(m^2).  Physical Exam: Vital signs reviewed ZOX:WRUE is a well-developed well-nourished alert cooperative   who appears stated age in no acute distress.  HEENT: normocephalic atraumatic , Eyes: PERRL EOM's full, conjunctiva clear, Nares: paten,t no deformity discharge or tenderness., Ears: no deformity EAC's clear TMs with normal landmarks. Mouth: clear OP, no lesions, edema.  Moist mucous membranes. Dentition in adequate repair. NECK: supple without masses, thyromegaly or bruits. CHEST/PULM:  Clear to auscultation and percussion breath sounds equal no wheeze , rales or rhonchi. No chest wall deformities or tenderness. Breast: normal by inspection . No dimpling, discharge, masses, tenderness or discharge . CV: PMI is nondisplaced, S1 S2 no gallops, murmurs, rubs. Peripheral pulses are full without delay.No JVD .  ABDOMEN: Bowel sounds normal nontender  No guard or rebound, no hepato splenomegal no CVA tenderness.  No hernia. Extremtities:  No clubbing cyanosis or edema, no acute joint swelling or redness no focal atrophy NEURO:  Oriented x3, cranial nerves 3-12 appear to be intact, no obvious focal weakness,gait within normal limits no abnormal reflexes or asymmetrical SKIN: No acute rashes normal turgor, color, no bruising or petechiae. PSYCH: Oriented, good eye contact, no obvious depression anxiety, cognition and judgment appear normal. LN: no cervical axillary inguinal adenopathy No noted deficits in memory, attention, and  speech.   Lab Results  Component Value Date   WBC 4.7 05/01/2012   HGB 13.5 05/01/2012   HCT 40.1 05/01/2012   PLT 247.0 05/01/2012   GLUCOSE 95 05/01/2012   CHOL 236* 05/01/2012   TRIG 54.0 05/01/2012   HDL 94.40 05/01/2012   LDLDIRECT 109.8 05/01/2012   LDLCALC 105* 12/05/2006   ALT 9 05/01/2012   AST 17 05/01/2012   NA 140 05/01/2012   K 4.1 05/01/2012   CL 106 05/01/2012   CREATININE 1.0 05/01/2012   BUN 24* 05/01/2012   CO2 27 05/01/2012   TSH 2.14 05/01/2012    ASSESSMENT AND  PLAN:  Discussed the following assessment and plan:  1. Welcome to Medicare preventive visit  Basic metabolic panel, CBC with Differential, Hepatic function panel, Lipid panel, TSH, T4, free, Vitamin D 25 hydroxy, Magnesium  2. Leg cramp  Basic metabolic panel, CBC with Differential, Hepatic function panel, Lipid panel, TSH, T4, free, Vitamin D 25 hydroxy, Magnesium   r/o metabolic  3. Hypertension  Basic metabolic panel, CBC with Differential, Hepatic function panel, Lipid panel, TSH, T4, free, Vitamin D 25 hydroxy, Magnesium   up today  inc verapamil to 180 ; may get constipation at higher doses consider othermed change if needed  4. HYPERLIPIDEMIA  Basic metabolic panel, CBC with Differential, Hepatic function panel, Lipid panel, TSH, T4, free, Vitamin D 25 hydroxy, Magnesium  5. OSTEOPENIA  Basic metabolic panel, CBC with Differential, Hepatic function panel, Lipid panel, TSH, T4, free, Vitamin D 25 hydroxy, Magnesium, DG Bone Density   due for dexa off supp for years cause of  constipatino concerns   check vit d today   opt calcium in diet .  in meantime  6. MIGRAINE HEADACHE     stable  7. Need for pneumococcal vaccination  Pneumococcal conjugate vaccine 13-valent less than 5yo IM  8. History of recurrent UTIs     ok to continue ic prophylaxis  rx for trip in may if needed bid for 5 days     Patient Care Team: Madelin Headings, MD as PCP - General fletcher (Gynecology) Iva Boop, MD (Gastroenterology)  Patient  Instructions  Monitor your blood pressure at least 3 times a week until we know it is controlled. We'll change the verapamil to 180 mg a day. We can switch medications if needed to control blood pressure avoid salt increase exercise moderation alcohol.-  DASH diet. Will notify you  of labs when available. Get a bone density  Will notify of results and plan per results  Refill antibiotic for  Recurrent uti for travel and prophylaxis as discussed .  Check with  Coverage for shingles vaccine Zostavax.   ROV in 2 months about blood pressure control.      Preventive Care for Adults, Female A healthy lifestyle and preventive care can promote health and wellness. Preventive health guidelines for women include the following key practices.  A routine yearly physical is a good way to check with your caregiver about your health and preventive screening. It is a chance to share any concerns and updates on your health, and to receive a thorough exam.  Visit your dentist for a routine exam and preventive care every 6 months. Brush your teeth twice a day and floss once a day. Good oral hygiene prevents tooth decay and gum disease.  The frequency of eye exams is based on your age, health, family medical history, use of contact lenses, and other factors. Follow your caregiver's recommendations for frequency of eye exams.  Eat a healthy diet. Foods like vegetables, fruits, whole grains, low-fat dairy products, and lean protein foods contain the nutrients you need without too many calories. Decrease your intake of foods high in solid fats, added sugars, and salt. Eat the right amount of calories for you.Get information about a proper diet from your caregiver, if necessary.  Regular physical exercise is one of the most important things you can do for your health. Most adults should get at least 150 minutes of moderate-intensity exercise (any activity that increases your heart rate and causes you to sweat) each  week. In addition,  most adults need muscle-strengthening exercises on 2 or more days a week.  Maintain a healthy weight. The body mass index (BMI) is a screening tool to identify possible weight problems. It provides an estimate of body fat based on height and weight. Your caregiver can help determine your BMI, and can help you achieve or maintain a healthy weight.For adults 20 years and older:  A BMI below 18.5 is considered underweight.  A BMI of 18.5 to 24.9 is normal.  A BMI of 25 to 29.9 is considered overweight.  A BMI of 30 and above is considered obese.  Maintain normal blood lipids and cholesterol levels by exercising and minimizing your intake of saturated fat. Eat a balanced diet with plenty of fruit and vegetables. Blood tests for lipids and cholesterol should begin at age 30 and be repeated every 5 years. If your lipid or cholesterol levels are high, you are over 50, or you are at high risk for heart disease, you may need your cholesterol levels checked more frequently.Ongoing high lipid and cholesterol levels should be treated with medicines if diet and exercise are not effective.  If you smoke, find out from your caregiver how to quit. If you do not use tobacco, do not start.  If you are pregnant, do not drink alcohol. If you are breastfeeding, be very cautious about drinking alcohol. If you are not pregnant and choose to drink alcohol, do not exceed 1 drink per day. One drink is considered to be 12 ounces (355 mL) of beer, 5 ounces (148 mL) of wine, or 1.5 ounces (44 mL) of liquor.  Avoid use of street drugs. Do not share needles with anyone. Ask for help if you need support or instructions about stopping the use of drugs.  High blood pressure causes heart disease and increases the risk of stroke. Your blood pressure should be checked at least every 1 to 2 years. Ongoing high blood pressure should be treated with medicines if weight loss and exercise are not effective.  If you  are 49 to 66 years old, ask your caregiver if you should take aspirin to prevent strokes.  Diabetes screening involves taking a blood sample to check your fasting blood sugar level. This should be done once every 3 years, after age 29, if you are within normal weight and without risk factors for diabetes. Testing should be considered at a younger age or be carried out more frequently if you are overweight and have at least 1 risk factor for diabetes.  Breast cancer screening is essential preventive care for women. You should practice "breast self-awareness." This means understanding the normal appearance and feel of your breasts and may include breast self-examination. Any changes detected, no matter how small, should be reported to a caregiver. Women in their 48s and 30s should have a clinical breast exam (CBE) by a caregiver as part of a regular health exam every 1 to 3 years. After age 23, women should have a CBE every year. Starting at age 58, women should consider having a mammography (breast X-ray test) every year. Women who have a family history of breast cancer should talk to their caregiver about genetic screening. Women at a high risk of breast cancer should talk to their caregivers about having magnetic resonance imaging (MRI) and a mammography every year.  The Pap test is a screening test for cervical cancer. A Pap test can show cell changes on the cervix that might become cervical cancer if left untreated. A Pap  test is a procedure in which cells are obtained and examined from the lower end of the uterus (cervix).  Women should have a Pap test starting at age 64.  Between ages 43 and 51, Pap tests should be repeated every 2 years.  Beginning at age 66, you should have a Pap test every 3 years as long as the past 3 Pap tests have been normal.  Some women have medical problems that increase the chance of getting cervical cancer. Talk to your caregiver about these problems. It is especially  important to talk to your caregiver if a new problem develops soon after your last Pap test. In these cases, your caregiver may recommend more frequent screening and Pap tests.  The above recommendations are the same for women who have or have not gotten the vaccine for human papillomavirus (HPV).  If you had a hysterectomy for a problem that was not cancer or a condition that could lead to cancer, then you no longer need Pap tests. Even if you no longer need a Pap test, a regular exam is a good idea to make sure no other problems are starting.  If you are between ages 89 and 25, and you have had normal Pap tests going back 10 years, you no longer need Pap tests. Even if you no longer need a Pap test, a regular exam is a good idea to make sure no other problems are starting.  If you have had past treatment for cervical cancer or a condition that could lead to cancer, you need Pap tests and screening for cancer for at least 20 years after your treatment.  If Pap tests have been discontinued, risk factors (such as a new sexual partner) need to be reassessed to determine if screening should be resumed.  The HPV test is an additional test that may be used for cervical cancer screening. The HPV test looks for the virus that can cause the cell changes on the cervix. The cells collected during the Pap test can be tested for HPV. The HPV test could be used to screen women aged 30 years and older, and should be used in women of any age who have unclear Pap test results. After the age of 59, women should have HPV testing at the same frequency as a Pap test.  Colorectal cancer can be detected and often prevented. Most routine colorectal cancer screening begins at the age of 67 and continues through age 96. However, your caregiver may recommend screening at an earlier age if you have risk factors for colon cancer. On a yearly basis, your caregiver may provide home test kits to check for hidden blood in the stool.  Use of a small camera at the end of a tube, to directly examine the colon (sigmoidoscopy or colonoscopy), can detect the earliest forms of colorectal cancer. Talk to your caregiver about this at age 62, when routine screening begins. Direct examination of the colon should be repeated every 5 to 10 years through age 75, unless early forms of pre-cancerous polyps or small growths are found.  Hepatitis C blood testing is recommended for all people born from [birth year] through 1965 and any individual with known risks for hepatitis C.  Practice safe sex. Use condoms and avoid high-risk sexual practices to reduce the spread of sexually transmitted infections (STIs). STIs include gonorrhea, chlamydia, syphilis, trichomonas, herpes, HPV, and human immunodeficiency virus (HIV). Herpes, HIV, and HPV are viral illnesses that have no cure. They can result in  disability, cancer, and death. Sexually active women aged 5 and younger should be checked for chlamydia. Older women with new or multiple partners should also be tested for chlamydia. Testing for other STIs is recommended if you are sexually active and at increased risk.  Osteoporosis is a disease in which the bones lose minerals and strength with aging. This can result in serious bone fractures. The risk of osteoporosis can be identified using a bone density scan. Women ages 52 and over and women at risk for fractures or osteoporosis should discuss screening with their caregivers. Ask your caregiver whether you should take a calcium supplement or vitamin D to reduce the rate of osteoporosis.  Menopause can be associated with physical symptoms and risks. Hormone replacement therapy is available to decrease symptoms and risks. You should talk to your caregiver about whether hormone replacement therapy is right for you.  Use sunscreen with sun protection factor (SPF) of 30 or more. Apply sunscreen liberally and repeatedly throughout the day. You should seek shade when  your shadow is shorter than you. Protect yourself by wearing long sleeves, pants, a wide-brimmed hat, and sunglasses year round, whenever you are outdoors.  Once a month, do a whole body skin exam, using a mirror to look at the skin on your back. Notify your caregiver of new moles, moles that have irregular borders, moles that are larger than a pencil eraser, or moles that have changed in shape or color.  Stay current with required immunizations.  Influenza. You need a dose every fall (or winter). The composition of the flu vaccine changes each year, so being vaccinated once is not enough.  Pneumococcal polysaccharide. You need 1 to 2 doses if you smoke cigarettes or if you have certain chronic medical conditions. You need 1 dose at age 81 (or older) if you have never been vaccinated.  Tetanus, diphtheria, pertussis (Tdap, Td). Get 1 dose of Tdap vaccine if you are younger than age 92, are over 5 and have contact with an infant, are a Research scientist (physical sciences), are pregnant, or simply want to be protected from whooping cough. After that, you need a Td booster dose every 10 years. Consult your caregiver if you have not had at least 3 tetanus and diphtheria-containing shots sometime in your life or have a deep or dirty wound.  HPV. You need this vaccine if you are a woman age 35 or younger. The vaccine is given in 3 doses over 6 months.  Measles, mumps, rubella (MMR). You need at least 1 dose of MMR if you were born in 1957 or later. You may also need a second dose.  Meningococcal. If you are age 73 to 75 and a first-year college student living in a residence hall, or have one of several medical conditions, you need to get vaccinated against meningococcal disease. You may also need additional booster doses.  Zoster (shingles). If you are age 30 or older, you should get this vaccine.  Varicella (chickenpox). If you have never had chickenpox or you were vaccinated but received only 1 dose, talk to your  caregiver to find out if you need this vaccine.  Hepatitis A. You need this vaccine if you have a specific risk factor for hepatitis A virus infection or you simply wish to be protected from this disease. The vaccine is usually given as 2 doses, 6 to 18 months apart.  Hepatitis B. You need this vaccine if you have a specific risk factor for hepatitis B virus infection or  you simply wish to be protected from this disease. The vaccine is given in 3 doses, usually over 6 months. Preventive Services / Frequency Ages 80 and over  Blood pressure check.** / Every 1 to 2 years.  Lipid and cholesterol check.** / Every 5 years beginning at age 39.  Clinical breast exam.** / Every year after age 36.  Mammogram.** / Every year beginning at age 25 and continuing for as long as you are in good health. Consult with your caregiver.  Pap test.** / Every 3 years starting at age 39 through age 24 or 40 with a 3 consecutive normal Pap tests. Testing can be stopped between 65 and 70 with 3 consecutive normal Pap tests and no abnormal Pap or HPV tests in the past 10 years.  HPV screening.** / Every 3 years from ages 44 through ages 80 or 1 with a history of 3 consecutive normal Pap tests. Testing can be stopped between 65 and 70 with 3 consecutive normal Pap tests and no abnormal Pap or HPV tests in the past 10 years.  Fecal occult blood test (FOBT) of stool. / Every year beginning at age 53 and continuing until age 7. You may not need to do this test if you get a colonoscopy every 10 years.  Flexible sigmoidoscopy or colonoscopy.** / Every 5 years for a flexible sigmoidoscopy or every 10 years for a colonoscopy beginning at age 21 and continuing until age 16.  Hepatitis C blood test.** / For all people born from [birth year] through 1965 and any individual with known risks for hepatitis C.  Osteoporosis screening.** / A one-time screening for women ages 58 and over and women at risk for fractures or  osteoporosis.  Skin self-exam. / Monthly.  Influenza immunization.** / Every year.  Pneumococcal polysaccharide immunization.** / 1 dose at age 60 (or older) if you have never been vaccinated.  Tetanus, diphtheria, pertussis (Tdap, Td) immunization. / A one-time dose of Tdap vaccine if you are over 65 and have contact with an infant, are a Research scientist (physical sciences), or simply want to be protected from whooping cough. After that, you need a Td booster dose every 10 years.  Varicella immunization.** / Consult your caregiver.  Meningococcal immunization.** / Consult your caregiver.  Hepatitis A immunization.** / Consult your caregiver. 2 doses, 6 to 18 months apart.  Hepatitis B immunization.** / Check with your caregiver. 3 doses, usually over 6 months. ** Family history and personal history of risk and conditions may change your caregiver's recommendations. Document Released: 05/11/2001 Document Revised: 06/07/2011 Document Reviewed: 08/10/2010 [facility] Patient Information 2013 [place], [place].  DASH Diet The DASH diet stands for "Dietary Approaches to Stop Hypertension." It is a healthy eating plan that has been shown to reduce high blood pressure (hypertension) in as little as 14 days, while also possibly providing other significant health benefits. These other health benefits include reducing the risk of breast cancer after menopause and reducing the risk of type 2 diabetes, heart disease, colon cancer, and stroke. Health benefits also include weight loss and slowing kidney failure in patients with chronic kidney disease.  DIET GUIDELINES  Limit salt (sodium). Your diet should contain less than 1500 mg of sodium daily.  Limit refined or processed carbohydrates. Your diet should include mostly whole grains. Desserts and added sugars should be used sparingly.  Include small amounts of heart-healthy fats. These types of fats include nuts, oils, and tub margarine. Limit saturated and trans fats.  These fats have been shown to  be harmful in the body. CHOOSING FOODS  The following food groups are based on a 2000 calorie diet. See your Registered Dietitian for individual calorie needs. Grains and Grain Products (6 to 8 servings daily)  Eat More Often: Whole-wheat bread, brown rice, whole-grain or wheat pasta, quinoa, popcorn without added fat or salt (air popped).  Eat Less Often: White bread, white pasta, white rice, cornbread. Vegetables (4 to 5 servings daily)  Eat More Often: Fresh, frozen, and canned vegetables. Vegetables may be raw, steamed, roasted, or grilled with a minimal amount of fat.  Eat Less Often/Avoid: Creamed or fried vegetables. Vegetables in a cheese sauce. Fruit (4 to 5 servings daily)  Eat More Often: All fresh, canned (in natural juice), or frozen fruits. Dried fruits without added sugar. One hundred percent fruit juice ( cup [237 mL] daily).  Eat Less Often: Dried fruits with added sugar. Canned fruit in light or heavy syrup. [employer], Fish, and Poultry (2 servings or less daily. One serving is 3 to 4 oz [85-114 g]).  Eat More Often: Ninety percent or leaner ground beef, tenderloin, sirloin. Round cuts of beef, chicken breast, [place] breast. All fish. Grill, bake, or broil your meat. Nothing should be fried.  Eat Less Often/Avoid: Fatty cuts of meat, [place], or chicken leg, thigh, or wing. Fried cuts of meat or fish. Dairy (2 to 3 servings)  Eat More Often: Low-fat or fat-free milk, low-fat plain or light yogurt, reduced-fat or part-skim cheese.  Eat Less Often/Avoid: Milk (whole, 2%).Whole milk yogurt. Full-fat cheeses. Nuts, Seeds, and Legumes (4 to 5 servings per week)  Eat More Often: All without added salt.  Eat Less Often/Avoid: Salted nuts and seeds, canned beans with added salt. Fats and Sweets (limited)  Eat More Often: Vegetable oils, tub margarines without trans fats, sugar-free gelatin. Mayonnaise and salad dressings.  Eat Less  Often/Avoid: Coconut oils, palm oils, butter, stick margarine, cream, half and half, cookies, candy, pie. FOR MORE INFORMATION The Dash Diet Eating Plan: www.dashdiet.org Document Released: 03/04/2011 Document Revised: 06/07/2011 Document Reviewed: 03/04/2011 [facility] Patient Information 2013 [place], [place].     Neta Mends. Kaedyn Belardo M.D.  Health Maintenance  Topic Date Due  . Zostavax  01/22/2007  . Influenza Vaccine  11/27/2012  . Mammogram  06/07/2013  . Colonoscopy  02/02/2014  . Tetanus/tdap  03/30/2015  . Pneumococcal Polysaccharide Vaccine Age 49 And Over  Completed   Health Maintenance Review

## 2012-05-02 LAB — VITAMIN D 25 HYDROXY (VIT D DEFICIENCY, FRACTURES): Vit D, 25-Hydroxy: 25 ng/mL — ABNORMAL LOW (ref 30–89)

## 2012-05-03 ENCOUNTER — Other Ambulatory Visit: Payer: Self-pay | Admitting: Internal Medicine

## 2012-05-03 DIAGNOSIS — Z1231 Encounter for screening mammogram for malignant neoplasm of breast: Secondary | ICD-10-CM

## 2012-05-08 ENCOUNTER — Telehealth: Payer: Self-pay | Admitting: Internal Medicine

## 2012-05-08 NOTE — Telephone Encounter (Signed)
Patient Information:  Caller Name: Lexine  Phone: [phone]  Patient: Brianna Harper, Brianna Harper  Gender: Female  DOB: 08-23-1945  Age: 67 Years  PCP: Berniece Andreas (Family Practice)  Office Follow Up:  Does the office need to follow up with this patient?: Yes  Instructions For The Office: Triage is  SEE IN OFFICE NOW and there are no appt; OFFICE PLEASE REVIEW AND CALL PT BACK REGARDING IMMUNIZATION REACTION   Symptoms  Reason For Call & Symptoms: Pt is calling and states that she had pneumonia vaccine on 05/01/12 to the left upper arm and on 05/06/12 she develped itching at the immunization site; red, raised lesion; itching; soreness and burning noted;  pt reports one large lesion 4 inches by 6 inches in size; no drainage; no fever;  Reviewed Health History In EMR: Yes  Reviewed Medications In EMR: Yes  Reviewed Allergies In EMR: Yes  Reviewed Surgeries / Procedures: Yes  Date of Onset of Symptoms: 05/06/2012  Treatments Tried: ice  Treatments Tried Worked: No  Guideline(s) Used:  Immunization Reactions  Disposition Per Guideline:   Go to Office Now  Reason For Disposition Reached:   Redness or red streak around the injection site begins > 48 hours after shot  Advice Given:  Cold Pack for Local Reaction at Injection Site:  Apply a cold pack or ice in a wet washcloth to the area for 20 minutes. Repeat in 1 hour.  Pain and Fever Medicines:  For pain or fever relief, take either acetaminophen or ibuprofen.  Call Back If:  You become worse.

## 2012-05-08 NOTE — Telephone Encounter (Signed)
Spoke to the pt.  Advised same directions as CAN.  Apply cold pack as needed and take pain reliever per directions on bottle.  Do you advise any other directions.  No fever.  Started on Saturday.  Raised, red, itching and burning.  Explained this sometimes happens with immunizations.

## 2012-05-08 NOTE — Telephone Encounter (Signed)
Agree/noted. 

## 2012-05-16 ENCOUNTER — Inpatient Hospital Stay: Admission: RE | Admit: 2012-05-16 | Payer: Medicare Other | Source: Ambulatory Visit

## 2012-05-23 ENCOUNTER — Ambulatory Visit (INDEPENDENT_AMBULATORY_CARE_PROVIDER_SITE_OTHER)
Admission: RE | Admit: 2012-05-23 | Discharge: 2012-05-23 | Disposition: A | Discharge status: Home / Self Care | Payer: Medicare Other | Source: Ambulatory Visit | Attending: Internal Medicine | Admitting: Internal Medicine

## 2012-05-23 DIAGNOSIS — Z0000 Encounter for general adult medical examination without abnormal findings: Secondary | ICD-10-CM

## 2012-05-23 DIAGNOSIS — R252 Cramp and spasm: Secondary | ICD-10-CM

## 2012-05-23 DIAGNOSIS — M899 Disorder of bone, unspecified: Secondary | ICD-10-CM | POA: Diagnosis not present

## 2012-05-23 DIAGNOSIS — G43909 Migraine, unspecified, not intractable, without status migrainosus: Secondary | ICD-10-CM

## 2012-05-23 DIAGNOSIS — Z139 Encounter for screening, unspecified: Secondary | ICD-10-CM | POA: Diagnosis not present

## 2012-05-23 DIAGNOSIS — Z23 Encounter for immunization: Secondary | ICD-10-CM

## 2012-05-23 DIAGNOSIS — M949 Disorder of cartilage, unspecified: Secondary | ICD-10-CM | POA: Diagnosis not present

## 2012-05-23 DIAGNOSIS — E785 Hyperlipidemia, unspecified: Secondary | ICD-10-CM

## 2012-05-23 DIAGNOSIS — Z87440 Personal history of urinary (tract) infections: Secondary | ICD-10-CM

## 2012-05-23 DIAGNOSIS — I10 Essential (primary) hypertension: Secondary | ICD-10-CM

## 2012-06-08 ENCOUNTER — Ambulatory Visit: Payer: Medicare Other

## 2012-06-28 ENCOUNTER — Ambulatory Visit: Payer: Medicare Other

## 2012-07-20 ENCOUNTER — Ambulatory Visit (INDEPENDENT_AMBULATORY_CARE_PROVIDER_SITE_OTHER): Payer: Medicare Other | Admitting: Internal Medicine

## 2012-07-20 ENCOUNTER — Encounter: Payer: Self-pay | Admitting: Internal Medicine

## 2012-07-20 VITALS — BP 130/80 | HR 78 | Temp 97.9°F | Wt 139.0 lb

## 2012-07-20 DIAGNOSIS — M899 Disorder of bone, unspecified: Secondary | ICD-10-CM | POA: Diagnosis not present

## 2012-07-20 DIAGNOSIS — I10 Essential (primary) hypertension: Secondary | ICD-10-CM | POA: Diagnosis not present

## 2012-07-20 DIAGNOSIS — M949 Disorder of cartilage, unspecified: Secondary | ICD-10-CM

## 2012-07-20 MED ORDER — TRIAMTERENE-HCTZ 37.5-25 MG PO TABS: ORAL_TABLET | ORAL | Status: DC

## 2012-07-20 NOTE — Patient Instructions (Signed)
blood pressure .  Is better today  Continue same regimen at this time.   Agree with extra d supplemtnation  dexa scan in 2-3 years . May add calcium 600 mg to augment  Calcium in diet  When get back from trip.   ROV at yearly visit  .  February

## 2012-07-20 NOTE — Assessment & Plan Note (Signed)
incr vit d  Optimize calcium    Repeat in 2-3 years .

## 2012-07-20 NOTE — Progress Notes (Signed)
Chief Complaint  Patient presents with  . Follow-up    HPI: Fu   bp issues    Now on higher dose of verapamil  180   No se noted  bp  In 130 /80 ranges  At home   reviewe ;labs and dexa  Trip 3 weeks in [place] .  Ask about constipation prevention  ROS: See pertinent positives and negatives per HPI.  Past Medical History  Diagnosis Date  . Anxiety   . Depression   . Hyperlipidemia   . GERD (gastroesophageal reflux disease)   . Osteopenia     dexa -2.0 hip 2008  . Migraine   . Rapid heart rate     hx of   . PMS (premenstrual syndrome)     Family History  Problem Relation Age of Onset  . Hypertension Mother   . Arthritis Father   . Ovarian cancer Maternal Grandmother     History   Social History  . Marital Status: Married    Spouse Name: N/A    Number of Children: 2  . Years of Education: N/A   Occupational History  . retired    Social History Main Topics  . Smoking status: Never Smoker   . Smokeless tobacco: Never Used  . Alcohol Use: No  . Drug Use: No  . Sexually Active: None   Other Topics Concern  . None   Social History Narrative   HHof 2    Non smoker   2 children  Also twins that did not survive birth   Married    CB x 2   No pets     Outpatient Encounter Prescriptions as of 07/20/2012  Medication Sig Dispense Refill  . Cholecalciferol (VITAMIN D PO) Take 1,000 mg by mouth daily.      Marland Kitchen estradiol (ESTRACE) 0.1 MG/GM vaginal cream Place 2 g vaginally as directed.        . rizatriptan (MAXALT) 10 MG tablet Take 10 mg by mouth as needed. May repeat in 2 hours if needed       . triamterene-hydrochlorothiazide (MAXZIDE-25) 37.5-25 MG per tablet TAKE ONE-HALF TABLET BY MOUTH EVERY DAY  45 tablet  5  . trimethoprim (TRIMPEX) 100 MG tablet Take 1 po as directed for uti prevention  30 tablet  1  . verapamil (CALAN-SR) 180 MG CR tablet Take 1 tablet (180 mg total) by mouth at bedtime.  90 tablet  2  . [DISCONTINUED] triamterene-hydrochlorothiazide  (MAXZIDE-25) 37.5-25 MG per tablet TAKE ONE-HALF TABLET BY MOUTH EVERY DAY  45 tablet  5   No facility-administered encounter medications on file as of 07/20/2012.    EXAM:  BP 130/80  Pulse 78  Temp(Src) 97.9 F (36.6 C) (Oral)  Wt 139 lb (63.05 kg)  BMI 22.61 kg/m2  SpO2 97%  Body mass index is 22.61 kg/(m^2).  GENERAL: vitals reviewed and listed above, alert, oriented, appears well hydrated and in no acute distress  Repeat bp readings l 134/80 and right  130/78   NECK: no obvious masses on inspection palpation   LUNGS: clear to auscultation bilaterally, no wheezes, rales or rhonchi, good air movement  CV: HRRR, no clubbing cyanosis or  peripheral edema nl cap refill   MS: moves all extremities without noticeable focal  abnormality  PSYCH: pleasant and cooperative, no obvious depression or anxiety Lab Results  Component Value Date   WBC 4.7 05/01/2012   HGB 13.5 05/01/2012   HCT 40.1 05/01/2012   PLT 247.0 05/01/2012  GLUCOSE 95 05/01/2012   CHOL 236* 05/01/2012   TRIG 54.0 05/01/2012   HDL 94.40 05/01/2012   LDLDIRECT 109.8 05/01/2012   LDLCALC 105* 12/05/2006   ALT 9 05/01/2012   AST 17 05/01/2012   NA 140 05/01/2012   K 4.1 05/01/2012   CL 106 05/01/2012   CREATININE 1.0 05/01/2012   BUN 24* 05/01/2012   CO2 27 05/01/2012   TSH 2.14 05/01/2012    ASSESSMENT AND PLAN:  Discussed the following assessment and plan:  Hypertension - improved and may continue stay on same regimen until yearly   OSTEOPENIA - opt vit d calcium exercise  repeat dexa in 2-3 years   -Patient advised to return or notify health care team  if symptoms worsen or persist or new concerns arise.  Patient Instructions  blood pressure .  Is better today  Continue same regimen at this time.   Agree with extra d supplemtnation  dexa scan in 2-3 years . May add calcium 600 mg to augment  Calcium in diet  When get back from trip.   ROV at yearly visit  .  February       Shawnelle Spoerl K. Rease Swinson M.D.

## 2012-07-21 ENCOUNTER — Ambulatory Visit
Admission: RE | Admit: 2012-07-21 | Discharge: 2012-07-21 | Disposition: A | Discharge status: Home / Self Care | Payer: Medicare Other | Source: Ambulatory Visit | Attending: Internal Medicine | Admitting: Internal Medicine

## 2012-07-21 DIAGNOSIS — Z1231 Encounter for screening mammogram for malignant neoplasm of breast: Secondary | ICD-10-CM | POA: Diagnosis not present

## 2012-08-30 DIAGNOSIS — L570 Actinic keratosis: Secondary | ICD-10-CM | POA: Diagnosis not present

## 2012-08-30 DIAGNOSIS — D239 Other benign neoplasm of skin, unspecified: Secondary | ICD-10-CM | POA: Diagnosis not present

## 2012-08-30 DIAGNOSIS — I789 Disease of capillaries, unspecified: Secondary | ICD-10-CM | POA: Diagnosis not present

## 2012-08-30 DIAGNOSIS — D1801 Hemangioma of skin and subcutaneous tissue: Secondary | ICD-10-CM | POA: Diagnosis not present

## 2012-08-30 DIAGNOSIS — L408 Other psoriasis: Secondary | ICD-10-CM | POA: Diagnosis not present

## 2012-08-30 DIAGNOSIS — L821 Other seborrheic keratosis: Secondary | ICD-10-CM | POA: Diagnosis not present

## 2012-09-13 ENCOUNTER — Encounter: Payer: Self-pay | Admitting: Internal Medicine

## 2012-09-13 ENCOUNTER — Ambulatory Visit (INDEPENDENT_AMBULATORY_CARE_PROVIDER_SITE_OTHER): Payer: Medicare Other | Admitting: Internal Medicine

## 2012-09-13 VITALS — BP 134/86 | HR 86 | Temp 98.1°F | Wt 137.0 lb

## 2012-09-13 DIAGNOSIS — J029 Acute pharyngitis, unspecified: Secondary | ICD-10-CM | POA: Diagnosis not present

## 2012-09-13 LAB — POCT RAPID STREP A (OFFICE): Rapid Strep A Screen: NEGATIVE

## 2012-09-13 NOTE — Patient Instructions (Addendum)
There can be many causes of sore throat . Antibiotics only helpful for  Bacterial infection such as  Strep throat or a bacterial sinus infection.   Post  nasal drainage can cause the problem as well as reflux to the throat that can be silent .  Await your throat culture.   Continue gargles . moisture to air you breath .   Add  Prilosec  Or other acid blocker every day  In case reflux is adding to the problem.   Can try ant antihistamine for poss drainage at night such as Claritin if it doesn't dry out too much ( which can make sx worse!)  If not getting better   After the weekend / the next week  We may consider other evaluation    Sore Throat A sore throat is pain, burning, irritation, or scratchiness of the throat. There is often pain or tenderness when swallowing or talking. A sore throat may be accompanied by other symptoms, such as coughing, sneezing, fever, and swollen neck glands. A sore throat is often the first sign of another sickness, such as a cold, flu, strep throat, or mononucleosis (commonly known as mono). Most sore throats go away without medical treatment. CAUSES  The most common causes of a sore throat include:  A viral infection, such as a cold, flu, or mono.  A bacterial infection, such as strep throat, tonsillitis, or whooping cough.  Seasonal allergies.  Dryness in the air.  Irritants, such as smoke or pollution.  Gastroesophageal reflux disease (GERD). HOME CARE INSTRUCTIONS   Only take over-the-counter medicines as directed by your caregiver.  Drink enough fluids to keep your urine clear or pale yellow.  Rest as needed.  Try using throat sprays, lozenges, or sucking on hard candy to ease any pain (if older than 4 years or as directed).  Sip warm liquids, such as broth, herbal tea, or warm water with honey to relieve pain temporarily. You may also eat or drink cold or frozen liquids such as frozen ice pops.  Gargle with salt water (mix 1 tsp salt  with 8 oz of water).  Do not smoke and avoid secondhand smoke.  Put a cool-mist humidifier in your bedroom at night to moisten the air. You can also turn on a hot shower and sit in the bathroom with the door closed for 5 10 minutes. SEEK IMMEDIATE MEDICAL CARE IF:  You have difficulty breathing.  You are unable to swallow fluids, soft foods, or your saliva.  You have increased swelling in the throat.  Your sore throat does not get better in 7 days.  You have nausea and vomiting.  You have a fever or persistent symptoms for more than 2 3 days.  You have a fever and your symptoms suddenly get worse. MAKE SURE YOU:   Understand these instructions.  Will watch your condition.  Will get help right away if you are not doing well or get worse. Document Released: 04/22/2004 Document Revised: 03/01/2012 Document Reviewed: 11/21/2011 [facility] Patient Information 2014 [place], [place].

## 2012-09-13 NOTE — Progress Notes (Signed)
Chief Complaint  Patient presents with  . Sore Throat    Ongoing for 8 days.  No other sx.    HPI: Patient comes in today for SDA for  new problem evaluation. Sore throat for about 8 days and can get very bad.   No exposures.  Some grand hcildren  But not sick . No fever rare cough throat  clearing.   No head cold  Some drainage  ? like something in throat.   No heart burns has hx of reflux.    In past not on meds now.   no swollen glands  .  Tried  gargling salt water    Helps the best.    ROS: See pertinent positives and negatives per HPI.  Past Medical History  Diagnosis Date  . Anxiety   . Depression   . Hyperlipidemia   . GERD (gastroesophageal reflux disease)   . Osteopenia     dexa -2.0 hip 2008  . Migraine   . Rapid heart rate     hx of   . PMS (premenstrual syndrome)     Family History  Problem Relation Age of Onset  . Hypertension Mother   . Arthritis Father   . Ovarian cancer Maternal Grandmother     History   Social History  . Marital Status: Married    Spouse Name: N/A    Number of Children: 2  . Years of Education: N/A   Occupational History  . retired    Social History Main Topics  . Smoking status: Never Smoker   . Smokeless tobacco: Never Used  . Alcohol Use: No  . Drug Use: No  . Sexually Active: None   Other Topics Concern  . None   Social History Narrative   HHof 2    Non smoker   2 children  Also twins that did not survive birth   Married    CB x 2   No pets     Outpatient Encounter Prescriptions as of 09/13/2012  Medication Sig Dispense Refill  . Cholecalciferol (VITAMIN D PO) Take 1,000 mg by mouth daily.      Marland Kitchen estradiol (ESTRACE) 0.1 MG/GM vaginal cream Place 2 g vaginally as directed.        . rizatriptan (MAXALT) 10 MG tablet Take 10 mg by mouth as needed. May repeat in 2 hours if needed       . triamterene-hydrochlorothiazide (MAXZIDE-25) 37.5-25 MG per tablet TAKE ONE-HALF TABLET BY MOUTH EVERY DAY  45 tablet  5  .  trimethoprim (TRIMPEX) 100 MG tablet Take 1 po as directed for uti prevention  30 tablet  1  . verapamil (CALAN-SR) 180 MG CR tablet Take 1 tablet (180 mg total) by mouth at bedtime.  90 tablet  2   No facility-administered encounter medications on file as of 09/13/2012.    EXAM:  BP 134/86  Pulse 86  Temp(Src) 98.1 F (36.7 C) (Oral)  Wt 137 lb (62.143 kg)  BMI 22.28 kg/m2  SpO2 98%  Body mass index is 22.28 kg/(m^2).  GENERAL: vitals reviewed and listed above, alert, oriented, appears well hydrated and in no acute distress  HEENT: atraumatic, conjunctiva  clear, no obvious abnormalities on inspection of external nose and ears OP : no lesion edema or exudate   Redness  cobblstoning PPwall no  Edema  Tonsillar are no masses some redness palat clear   NECK: no obvious masses on inspection palpation  No adenopathy or tenderness in ln  area  LUNGS: clear to auscultation bilaterally, no wheezes, rales or rhonchi, good air movement CV: HRRR, no clubbing cyanosis  Skin on unusual rashes  MS: moves all extremities without noticeable focal  abnormality  PSYCH: pleasant and cooperative, no obvious depression or anxiety  ASSESSMENT AND PLAN:  Discussed the following assessment and plan:  Sore throat - Plan: POCT rapid strep A, Culture, Group A Strep  -Patient advised to return or notify health care team  if symptoms worsen or persist or new concerns arise.  Patient Instructions  There can be many causes of sore throat . Antibiotics only helpful for  Bacterial infection such as  Strep throat or a bacterial sinus infection.   Post  nasal drainage can cause the problem as well as reflux to the throat that can be silent .  Await your throat culture.   Continue gargles . moisture to air you breath .   Add  Prilosec  Or other acid blocker every day  In case reflux is adding to the problem.   Can try ant antihistamine for poss drainage at night such as Claritin if it doesn't dry out too  much ( which can make sx worse!)  If not getting better   After the weekend / the next week  We may consider other evaluation    Sore Throat A sore throat is pain, burning, irritation, or scratchiness of the throat. There is often pain or tenderness when swallowing or talking. A sore throat may be accompanied by other symptoms, such as coughing, sneezing, fever, and swollen neck glands. A sore throat is often the first sign of another sickness, such as a cold, flu, strep throat, or mononucleosis (commonly known as mono). Most sore throats go away without medical treatment. CAUSES  The most common causes of a sore throat include:  A viral infection, such as a cold, flu, or mono.  A bacterial infection, such as strep throat, tonsillitis, or whooping cough.  Seasonal allergies.  Dryness in the air.  Irritants, such as smoke or pollution.  Gastroesophageal reflux disease (GERD). HOME CARE INSTRUCTIONS   Only take over-the-counter medicines as directed by your caregiver.  Drink enough fluids to keep your urine clear or pale yellow.  Rest as needed.  Try using throat sprays, lozenges, or sucking on hard candy to ease any pain (if older than 4 years or as directed).  Sip warm liquids, such as broth, herbal tea, or warm water with honey to relieve pain temporarily. You may also eat or drink cold or frozen liquids such as frozen ice pops.  Gargle with salt water (mix 1 tsp salt with 8 oz of water).  Do not smoke and avoid secondhand smoke.  Put a cool-mist humidifier in your bedroom at night to moisten the air. You can also turn on a hot shower and sit in the bathroom with the door closed for 5 10 minutes. SEEK IMMEDIATE MEDICAL CARE IF:  You have difficulty breathing.  You are unable to swallow fluids, soft foods, or your saliva.  You have increased swelling in the throat.  Your sore throat does not get better in 7 days.  You have nausea and vomiting.  You have a fever or  persistent symptoms for more than 2 3 days.  You have a fever and your symptoms suddenly get worse. MAKE SURE YOU:   Understand these instructions.  Will watch your condition.  Will get help right away if you are not doing well or get  worse. Document Released: 04/22/2004 Document Revised: 03/01/2012 Document Reviewed: 11/21/2011 [facility] Patient Information 2014 [place], [place].      Neta Mends. Tajuana Kniskern M.D.

## 2012-09-15 LAB — CULTURE, GROUP A STREP: Organism ID, Bacteria: NORMAL

## 2012-09-22 DIAGNOSIS — H1030 Unspecified acute conjunctivitis, unspecified eye: Secondary | ICD-10-CM | POA: Diagnosis not present

## 2012-09-22 DIAGNOSIS — H16209 Unspecified keratoconjunctivitis, unspecified eye: Secondary | ICD-10-CM | POA: Diagnosis not present

## 2012-09-27 ENCOUNTER — Telehealth: Payer: Self-pay | Admitting: Internal Medicine

## 2012-09-27 NOTE — Telephone Encounter (Signed)
Caller: Brianna Harper/Patient; Phone: [phone]; Reason for Call: Continuing to have a sore throat, Onset (unk) and was told to call back if it does not get any better.  RN/CAN offered to triage and she refused.  She would like Dr.  Rosezella Florida nurse to call back.

## 2012-09-28 NOTE — Telephone Encounter (Signed)
Pt followed up today to ask about return call. Advised pt it will be Monday and pt agreed that is ok

## 2012-10-02 NOTE — Telephone Encounter (Signed)
I spoke to the pt.  She believes her problem in acid reflux.  She tried ranitidine 150mg  1 po q12 but does not believe it is strong enough.  She said Dr. Leone Payor had her on omeprazole 40mg  in the past and it worked.  She had some extra omeprazole 40mg .  Instructed her to start them 1 po qd and stop rantidine.  She would like new rx sent to [facility] on [address].  Please advise.  Thanks!!

## 2012-10-03 NOTE — Telephone Encounter (Signed)
Ok to rx prilosec  40 1 po qd disp 30 no refills  .  Consider  Seeing  ENT.

## 2012-10-04 ENCOUNTER — Other Ambulatory Visit: Payer: Self-pay | Admitting: Family Medicine

## 2012-10-04 MED ORDER — OMEPRAZOLE 40 MG PO CPDR: 40.0000 mg | DELAYED_RELEASE_CAPSULE | Freq: Every day | ORAL | Status: DC

## 2012-10-04 NOTE — Telephone Encounter (Signed)
Left message on home number for the pt to return my call. 

## 2012-10-04 NOTE — Telephone Encounter (Signed)
Patient notified rx sent to the pharmacy.  Will call back if problems persist for a referral to ENT.

## 2012-10-26 ENCOUNTER — Telehealth: Payer: Self-pay | Admitting: Internal Medicine

## 2012-10-26 NOTE — Telephone Encounter (Signed)
PT states that she would like a referral for her sore throat, that she's had for a few months now. She states that Dr. Fabian Sharp told her, at her last visit, that she could refer her to see an ENT specialist. Please assist.

## 2012-10-26 NOTE — Telephone Encounter (Signed)
Stated in your last note that you may consider other evaluation.  Please advise.  Thanks!!!

## 2012-10-27 NOTE — Telephone Encounter (Signed)
I advise  ent consult  ? Dr Suszanne Conners

## 2012-10-30 ENCOUNTER — Other Ambulatory Visit: Payer: Self-pay | Admitting: Family Medicine

## 2012-10-30 DIAGNOSIS — J029 Acute pharyngitis, unspecified: Secondary | ICD-10-CM

## 2012-10-30 NOTE — Telephone Encounter (Signed)
Order placed in the system. 

## 2012-11-02 DIAGNOSIS — H04129 Dry eye syndrome of unspecified lacrimal gland: Secondary | ICD-10-CM | POA: Diagnosis not present

## 2012-11-02 DIAGNOSIS — H16209 Unspecified keratoconjunctivitis, unspecified eye: Secondary | ICD-10-CM | POA: Diagnosis not present

## 2012-11-02 DIAGNOSIS — H1030 Unspecified acute conjunctivitis, unspecified eye: Secondary | ICD-10-CM | POA: Diagnosis not present

## 2012-11-07 DIAGNOSIS — K219 Gastro-esophageal reflux disease without esophagitis: Secondary | ICD-10-CM | POA: Diagnosis not present

## 2012-11-07 DIAGNOSIS — R070 Pain in throat: Secondary | ICD-10-CM | POA: Diagnosis not present

## 2012-11-20 ENCOUNTER — Ambulatory Visit (INDEPENDENT_AMBULATORY_CARE_PROVIDER_SITE_OTHER)
Admission: RE | Admit: 2012-11-20 | Discharge: 2012-11-20 | Disposition: A | Discharge status: Home / Self Care | Payer: Medicare Other | Source: Ambulatory Visit | Attending: Internal Medicine | Admitting: Internal Medicine

## 2012-11-20 ENCOUNTER — Ambulatory Visit (INDEPENDENT_AMBULATORY_CARE_PROVIDER_SITE_OTHER): Payer: Medicare Other | Admitting: Internal Medicine

## 2012-11-20 ENCOUNTER — Encounter: Payer: Self-pay | Admitting: Internal Medicine

## 2012-11-20 VITALS — BP 140/70 | HR 110 | Temp 99.0°F | Wt 136.0 lb

## 2012-11-20 DIAGNOSIS — J988 Other specified respiratory disorders: Secondary | ICD-10-CM

## 2012-11-20 DIAGNOSIS — J209 Acute bronchitis, unspecified: Secondary | ICD-10-CM | POA: Insufficient documentation

## 2012-11-20 DIAGNOSIS — J029 Acute pharyngitis, unspecified: Secondary | ICD-10-CM | POA: Diagnosis not present

## 2012-11-20 DIAGNOSIS — I10 Essential (primary) hypertension: Secondary | ICD-10-CM | POA: Diagnosis not present

## 2012-11-20 DIAGNOSIS — J449 Chronic obstructive pulmonary disease, unspecified: Secondary | ICD-10-CM | POA: Diagnosis not present

## 2012-11-20 MED ORDER — HYDROCODONE-HOMATROPINE 5-1.5 MG/5ML PO SYRP: 5.0000 mL | ORAL_SOLUTION | ORAL | Status: DC | PRN

## 2012-11-20 MED ORDER — ALBUTEROL SULFATE (2.5 MG/3ML) 0.083% IN NEBU
2.5000 mg | INHALATION_SOLUTION | Freq: Once | RESPIRATORY_TRACT | Status: AC
  Administered 2012-11-20: 2.5 mg via RESPIRATORY_TRACT

## 2012-11-20 MED ORDER — ALBUTEROL SULFATE (2.5 MG/3ML) 0.083% IN NEBU: 2.5000 mg | INHALATION_SOLUTION | Freq: Once | RESPIRATORY_TRACT | Status: DC

## 2012-11-20 NOTE — Progress Notes (Signed)
Chief Complaint  Patient presents with  . Cough    Started last Thursday.  Pt is unable to sleep at night.  Started on Clindamycin 300mg .  . Sore Throat  . Nasal Congestion    HPI: Patient comes in today for SDA for  new problem evaluation. She is here with her husband because of his concern for her health. Onset 4 days ago with sore throat that was more severe than her baseline. Became so severe that she called Dr. Karie Schwalbe. he called in an antibiotic over the phone had cold symptoms at that time she picked up the medicine but didn't take it until the next day when her throat was very bad. Hard to sleep  Congested head and  Chest  And hard to breath .    Slight fever at onset  101 and not since then  Dr Suszanne Conners    Did scope    Under rx for   2 nexium    Had hard time breathing  Last night and   Went into shower to lossen up. Husband was worried about her breathing was noisy. Used Vicks in a hot shower with some help but is tired. ROS: See pertinent positives and negatives per HPI. No history of asthma wheezing or tobacco smoke other exposures. Blood pressure at home is been in the 135 or 140 range we had decreased her verapamil last time. Past Medical History  Diagnosis Date  . Anxiety   . Depression   . Hyperlipidemia   . GERD (gastroesophageal reflux disease)   . Osteopenia     dexa -2.0 hip 2008  . Migraine   . Rapid heart rate     hx of   . PMS (premenstrual syndrome)     Family History  Problem Relation Age of Onset  . Hypertension Mother   . Arthritis Father   . Ovarian cancer Maternal Grandmother     History   Social History  . Marital Status: Married    Spouse Name: N/A    Number of Children: 2  . Years of Education: N/A   Occupational History  . retired    Social History Main Topics  . Smoking status: Never Smoker   . Smokeless tobacco: Never Used  . Alcohol Use: No  . Drug Use: No  . Sexual Activity: None   Other Topics Concern  . None   Social History  Narrative   HHof 2    Non smoker   2 children  Also twins that did not survive birth   Married    CB x 2   No pets     Outpatient Encounter Prescriptions as of 11/20/2012  Medication Sig Dispense Refill  . clindamycin (CLEOCIN) 300 MG capsule Take 300 mg by mouth 4 (four) times daily.      Marland Kitchen esomeprazole (NEXIUM) 20 MG capsule Take 40 mg by mouth daily before breakfast.      . rizatriptan (MAXALT) 10 MG tablet Take 10 mg by mouth as needed. May repeat in 2 hours if needed       . trimethoprim (TRIMPEX) 100 MG tablet Take 1 po as directed for uti prevention  30 tablet  1  . verapamil (CALAN-SR) 180 MG CR tablet Take 1 tablet (180 mg total) by mouth at bedtime.  90 tablet  2  . Cholecalciferol (VITAMIN D PO) Take 1,000 mg by mouth daily.      Marland Kitchen estradiol (ESTRACE) 0.1 MG/GM vaginal cream Place 2 g vaginally  as directed.        Marland Kitchen HYDROcodone-homatropine (HYCODAN) 5-1.5 MG/5ML syrup Take 5 mLs by mouth every 4 (four) hours as needed for cough.  180 mL  0  . triamterene-hydrochlorothiazide (MAXZIDE-25) 37.5-25 MG per tablet TAKE ONE-HALF TABLET BY MOUTH EVERY DAY  45 tablet  5  . [DISCONTINUED] omeprazole (PRILOSEC) 40 MG capsule Take 1 capsule (40 mg total) by mouth daily.  30 capsule  0   Facility-Administered Encounter Medications as of 11/20/2012  Medication Dose Route Frequency Provider Last Rate Last Dose  . albuterol (PROVENTIL) (2.5 MG/3ML) 0.083% nebulizer solution 2.5 mg  2.5 mg Nebulization Once Madelin Headings, MD      . albuterol (PROVENTIL) (2.5 MG/3ML) 0.083% nebulizer solution 2.5 mg  2.5 mg Nebulization Once Madelin Headings, MD      . [address] albuterol (PROVENTIL) (2.5 MG/3ML) 0.083% nebulizer solution 2.5 mg  2.5 mg Nebulization Once Madelin Headings, MD   2.5 mg at 11/20/12 1434    EXAM:  BP 140/70  Pulse 110  Temp(Src) 99 F (37.2 C) (Oral)  Wt 136 lb (61.689 kg)  BMI 22.12 kg/m2  SpO2 98%  Body mass index is 22.12 kg/(m^2).  GENERAL: vitals reviewed and listed  above, alert, oriented, appears well hydrated and in no acute distress he is congested with a wheezy bronchial cough. Nontoxic but moderately ill. HEENT: atraumatic, conjunctiva  clear, no obvious abnormalities on inspection of external nose and ears TMs are clear nares congested face nontender OP drainage tracks no acute lesions or edema moderate to mild erythema mildly hoarse no stridor NECK: no obvious masses on inspection palpation no significant adenopathy or tenderness.  LUNGS: Quiet respirations breath sounds equal but significant expiratory wheezing at end expiration no rales or rhonchi. After albuterol nebulizer wheezing is not heard. At that time she states that her chest pressure is improved and breathing CV: HRRR, no clubbing cyanosis or  peripheral edema nl cap refill  MS: moves all extremities without noticeable focal  abnormality PSYCH: pleasant and cooperative, no obvious depression or anxiety  ASSESSMENT AND PLAN:  Discussed the following assessment and plan:  Wheezing-associated respiratory infection - Plan: DG Chest 2 View, albuterol (PROVENTIL) (2.5 MG/3ML) 0.083% nebulizer solution 2.5 mg, albuterol (PROVENTIL) (2.5 MG/3ML) 0.083% nebulizer solution 2.5 mg, albuterol (PROVENTIL) (2.5 MG/3ML) 0.083% nebulizer solution 2.5 mg  Acute bronchitis - Plan: DG Chest 2 View, albuterol (PROVENTIL) (2.5 MG/3ML) 0.083% nebulizer solution 2.5 mg, albuterol (PROVENTIL) (2.5 MG/3ML) 0.083% nebulizer solution 2.5 mg, albuterol (PROVENTIL) (2.5 MG/3ML) 0.083% nebulizer solution 2.5 mg  Sore throat  Hypertension We'll follow her blood pressure after through the illness she is not on a decongestant can use saline for nose irritation. Can stop the clindamycin cough medicine for comfort if wheezing persistent and progressive we can treat bronchospasm. Check chest x-ray rule out pneumonia airspace disease. -Patient advised to return or notify health care team  if symptoms worsen or persist or new  concerns arise.  Patient Instructions  This acts like a viral infection  Causing bronchial infection and laryngitis. Croupy.  But advise chest x ray to make sure no pneumonia . Can stop antibiotic  I dont think it is helping your illness.  Your chest sounds better without wheezing after the breathing treatment.  We gave you albuterol that we use of ashtma and wheezing  Sore throat could be from this infection and  May need to run its course.  Cough med for comfort .    Acute  Bronchitis You have acute bronchitis. This means you have a chest cold. The airways in your lungs are red and sore (inflamed). Acute means it is sudden onset.  CAUSES Bronchitis is most often caused by the same virus that causes a cold. SYMPTOMS   Body aches.  Chest congestion.  Chills.  Cough.  Fever.  Shortness of breath.  Sore throat. TREATMENT  Acute bronchitis is usually treated with rest, fluids, and medicines for relief of fever or cough. Most symptoms should go away after a few days or a week. Increased fluids may help thin your secretions and will prevent dehydration. Your caregiver may give you an inhaler to improve your symptoms. The inhaler reduces shortness of breath and helps control cough. You can take over-the-counter pain relievers or cough medicine to decrease coughing, pain, or fever. A cool-air vaporizer may help thin bronchial secretions and make it easier to clear your chest. Antibiotics are usually not needed but can be prescribed if you smoke, are seriously ill, have chronic lung problems, are elderly, or you are at higher risk for developing complications.Allergies and asthma can make bronchitis worse. Repeated episodes of bronchitis may cause longstanding lung problems. Avoid smoking and secondhand smoke.Exposure to cigarette smoke or irritating chemicals will make bronchitis worse. If you are a cigarette smoker, consider using nicotine gum or skin patches to help control withdrawal  symptoms. Quitting smoking will help your lungs heal faster. Recovery from bronchitis is often slow, but you should start feeling better after 2 to 3 days. Cough from bronchitis frequently lasts for 3 to 4 weeks. To prevent another bout of acute bronchitis:  Quit smoking.  Wash your hands frequently to get rid of viruses or use a hand sanitizer.  Avoid other people with cold or virus symptoms.  Try not to touch your hands to your mouth, nose, or eyes. SEEK IMMEDIATE MEDICAL CARE IF:  You develop increased fever, chills, or chest pain.  You have severe shortness of breath or bloody sputum.  You develop dehydration, fainting, repeated vomiting, or a severe headache.  You have no improvement after 1 week of treatment or you get worse. MAKE SURE YOU:   Understand these instructions.  Will watch your condition.  Will get help right away if you are not doing well or get worse. Document Released: 04/22/2004 Document Revised: 06/07/2011 Document Reviewed: 07/08/2010 [facility] Patient Information 2014 [place], [place].      Neta Mends. Panosh M.D.

## 2012-11-20 NOTE — Patient Instructions (Addendum)
This acts like a viral infection  Causing bronchial infection and laryngitis. Croupy.  But advise chest x ray to make sure no pneumonia . Can stop antibiotic  I dont think it is helping your illness.  Your chest sounds better without wheezing after the breathing treatment.  We gave you albuterol that we use of ashtma and wheezing  Sore throat could be from this infection and  May need to run its course.  Cough med for comfort .    Acute Bronchitis You have acute bronchitis. This means you have a chest cold. The airways in your lungs are red and sore (inflamed). Acute means it is sudden onset.  CAUSES Bronchitis is most often caused by the same virus that causes a cold. SYMPTOMS   Body aches.  Chest congestion.  Chills.  Cough.  Fever.  Shortness of breath.  Sore throat. TREATMENT  Acute bronchitis is usually treated with rest, fluids, and medicines for relief of fever or cough. Most symptoms should go away after a few days or a week. Increased fluids may help thin your secretions and will prevent dehydration. Your caregiver may give you an inhaler to improve your symptoms. The inhaler reduces shortness of breath and helps control cough. You can take over-the-counter pain relievers or cough medicine to decrease coughing, pain, or fever. A cool-air vaporizer may help thin bronchial secretions and make it easier to clear your chest. Antibiotics are usually not needed but can be prescribed if you smoke, are seriously ill, have chronic lung problems, are elderly, or you are at higher risk for developing complications.Allergies and asthma can make bronchitis worse. Repeated episodes of bronchitis may cause longstanding lung problems. Avoid smoking and secondhand smoke.Exposure to cigarette smoke or irritating chemicals will make bronchitis worse. If you are a cigarette smoker, consider using nicotine gum or skin patches to help control withdrawal symptoms. Quitting smoking will help your  lungs heal faster. Recovery from bronchitis is often slow, but you should start feeling better after 2 to 3 days. Cough from bronchitis frequently lasts for 3 to 4 weeks. To prevent another bout of acute bronchitis:  Quit smoking.  Wash your hands frequently to get rid of viruses or use a hand sanitizer.  Avoid other people with cold or virus symptoms.  Try not to touch your hands to your mouth, nose, or eyes. SEEK IMMEDIATE MEDICAL CARE IF:  You develop increased fever, chills, or chest pain.  You have severe shortness of breath or bloody sputum.  You develop dehydration, fainting, repeated vomiting, or a severe headache.  You have no improvement after 1 week of treatment or you get worse. MAKE SURE YOU:   Understand these instructions.  Will watch your condition.  Will get help right away if you are not doing well or get worse. Document Released: 04/22/2004 Document Revised: 06/07/2011 Document Reviewed: 07/08/2010 [facility] Patient Information 2014 [place], [place].

## 2012-12-05 DIAGNOSIS — R070 Pain in throat: Secondary | ICD-10-CM | POA: Diagnosis not present

## 2012-12-05 DIAGNOSIS — K219 Gastro-esophageal reflux disease without esophagitis: Secondary | ICD-10-CM | POA: Diagnosis not present

## 2012-12-12 ENCOUNTER — Telehealth: Payer: Self-pay | Admitting: Internal Medicine

## 2012-12-12 NOTE — Telephone Encounter (Signed)
Pt would like a referral to see Dr Leone Payor for acid reflux. Pt has seen ENT and has inflamed esophagus in vocal cord area. Please advise. Pt has been taking nexium

## 2012-12-12 NOTE — Telephone Encounter (Signed)
Ok to do referral as patient requests.

## 2012-12-13 ENCOUNTER — Other Ambulatory Visit: Payer: Self-pay | Admitting: Family Medicine

## 2012-12-13 DIAGNOSIS — K219 Gastro-esophageal reflux disease without esophagitis: Secondary | ICD-10-CM

## 2012-12-13 NOTE — Telephone Encounter (Signed)
Referral placed in the system. 

## 2012-12-22 ENCOUNTER — Ambulatory Visit (INDEPENDENT_AMBULATORY_CARE_PROVIDER_SITE_OTHER): Payer: Medicare Other

## 2012-12-22 DIAGNOSIS — Z23 Encounter for immunization: Secondary | ICD-10-CM

## 2013-01-01 ENCOUNTER — Ambulatory Visit (INDEPENDENT_AMBULATORY_CARE_PROVIDER_SITE_OTHER): Payer: Medicare Other | Admitting: Internal Medicine

## 2013-01-01 ENCOUNTER — Encounter: Payer: Self-pay | Admitting: Internal Medicine

## 2013-01-01 VITALS — BP 128/90 | HR 72 | Ht 66.0 in | Wt 135.8 lb

## 2013-01-01 DIAGNOSIS — J040 Acute laryngitis: Secondary | ICD-10-CM | POA: Diagnosis not present

## 2013-01-01 DIAGNOSIS — K219 Gastro-esophageal reflux disease without esophagitis: Secondary | ICD-10-CM | POA: Diagnosis not present

## 2013-01-01 MED ORDER — OMEPRAZOLE 40 MG PO CPDR: 40.0000 mg | DELAYED_RELEASE_CAPSULE | Freq: Two times a day (BID) | ORAL | Status: DC

## 2013-01-01 NOTE — Progress Notes (Signed)
  Subjective:    Patient ID: Brianna Harper, female    DOB: 1945-11-06, 67 y.o.   MRN: [id number]  HPI The patient is here because of a sore throat problem in a possible relationship to reflux disease. Over the years she is intermittently taking PPIs, she has taken a prolonged period of time, treat reflux symptoms of heartburn and some time she's had sore throat problems in the past.  I last saw her in 2012. At that time she was having some similar symptoms though it doesn't sound like there is severe, she was started on Nexium and she did well for a while. As she frequently does she got off of the PPI, she has had concerns about the possibility of associated osteoporosis and other possible side effects. Since that time, in June of this year she started having a bad sore throat, she went to see her primary care physician, Dr. Fabian Sharp. She was referred to eat her nose and throat and saw Dr. Suszanne Conners. He saw her in August and then again in September. She had mild diffuse laryngeal erythema and edema and moderate edema in the posterior laryngeal area and he thought that this could be consistent with reflux. These changes were present on both exams. She started back on PPI therapy. She has been taking originally omeprazole 20 mg daily and then started using over-the-counter Nexium 20 mg twice a day before breakfast and supper. She has not noted much difference, having been on that since August 22. In the interim she had a severe problem with upper respiratory infection and bronchitis and cough and was really unwell for about 3 weeks. She is recovering from that. She describes a tightness in the throat area at times and the discomfort in the chest like a soreness, but no classic heartburn. Her throat feels sore, without laterality. She denies significant allergy symptoms or significant postnasal drip. She has limited caffeine, alcohol and greatly restricted her diet with respect to reflux inciting foods. Medications,  allergies, past medical history, past surgical history, family history and social history are reviewed and updated in the EMR. Review of Systems As above    Objective:   Physical Exam Well-developed well-nourished elderly white woman looking younger than stated age.   I have reviewed Dr.Teoh's 8/22 note.     Assessment & Plan:   1. Laryngitis from reflux of stomach acid- ?

## 2013-01-01 NOTE — Assessment & Plan Note (Signed)
I explained to her and her husband that the objective findings on laryngoscopy could be related to reflux but we really don't know. She has improved with PPI therapy with similar symptoms over the years but has always been reluctant to take long-term for years. I think the response to PPI therapy does tell us that she probably has at least some component of reflux-related injury. I've explained this process to her. She will go back on omeprazole to 40 mg twice a day and see me in 2 months. I've explained that he can take several months of treatment for this therapy to eliminate the symptoms. I have reassured her that there is no serious problem near though there is some risk of damage to the vocal cords etc. with chronic Proximal reflux, if she indeed has that. I think the possibility of allergies or sinus problems remained in the picture, and her recent problems with an acute bronchitis that was severe may have aggravated things and clouded the picture as far as improvement from the PPI therapy.

## 2013-01-01 NOTE — Patient Instructions (Addendum)
We have sent the following medications to your pharmacy for you to pick up at your convenience: Omeprazole one tablet by mouth twice daily.   Follow up with [place] in 2 months.  Thank you for choosing me and [facility].  Iva Boop, M.D., [facility]

## 2013-01-18 ENCOUNTER — Other Ambulatory Visit: Payer: Self-pay | Admitting: Internal Medicine

## 2013-01-22 ENCOUNTER — Telehealth: Payer: Self-pay | Admitting: Internal Medicine

## 2013-01-22 NOTE — Telephone Encounter (Signed)
Patient reports that she does not feel better after 3 weeks of BID omeprazole.  I have reviewed the office note from 01/01/13 with her and that per the note, it could take several months to see an improvement.  She verbalized understanding to continue omeprazole 40 mg BID and see him in December.  She will call back for any questions or concerns

## 2013-02-02 ENCOUNTER — Ambulatory Visit (INDEPENDENT_AMBULATORY_CARE_PROVIDER_SITE_OTHER): Payer: Medicare Other | Admitting: Internal Medicine

## 2013-02-02 ENCOUNTER — Encounter: Payer: Self-pay | Admitting: Internal Medicine

## 2013-02-02 VITALS — BP 120/64 | HR 76 | Ht 66.0 in | Wt 133.0 lb

## 2013-02-02 DIAGNOSIS — J040 Acute laryngitis: Secondary | ICD-10-CM | POA: Diagnosis not present

## 2013-02-02 DIAGNOSIS — K219 Gastro-esophageal reflux disease without esophagitis: Secondary | ICD-10-CM

## 2013-02-02 MED ORDER — OMEPRAZOLE-SODIUM BICARBONATE 40-1100 MG PO CAPS: 1.0000 | ORAL_CAPSULE | Freq: Every day | ORAL | Status: DC

## 2013-02-02 NOTE — Patient Instructions (Signed)
Today you have been given samples of zegerid , take one capsule daily at bedtime.  Take one omeprazole daily at breakfast time.  Follow up with [place] in a month.  I appreciate the opportunity to care for you.

## 2013-02-02 NOTE — Assessment & Plan Note (Signed)
Change to omeprazole 40 Am and Zegerid PM Consider alternative Tx vs. Studies if fails to respond

## 2013-02-02 NOTE — Assessment & Plan Note (Addendum)
Not much better and w/ globus and early satiety. Change to Omeprazole 40 mg qAM and Zegerid 40 mg qhs RTC 1 month I explained that it is too soon to say she has failed high dose PPI Tx 15 mins time with patient and husband

## 2013-02-03 NOTE — Progress Notes (Signed)
  Subjective:    Patient ID: Brianna Harper, female    DOB: [birth year], 67 y.o.   MRN: [id number]  HPIMs. Brianna Harper is here with her husband - sooner than plannd. I last saw her 1 month ago and she was to be seen at 2 months. She has had inflammatory changes seen in larynx and has sore throat and globus. Omeprazole was increased to 40 mg bid 1 month ago and she has not really improved with that and diet changes, and head of bed elevation. She is now also having a pressure sensation in the chest and some ? Of early satiety.  In the past she recalls responding more quickly to changes in PPI.  Nights are particularly bothersome for her. Wt Readings from Last 3 Encounters:  02/02/13 133 lb (60.328 kg)  01/01/13 135 lb 12.8 oz (61.598 kg)  11/20/12 136 lb (61.689 kg)   Medications, allergies, past medical history, past surgical history, family history and social history are reviewed and updated in the EMR.  Review of Systems As per HPI    Objective:   Physical Exam WDWN NAD    Assessment & Plan:  Laryngitis from reflux of stomach acid- ?  GERD

## 2013-02-05 ENCOUNTER — Encounter: Payer: Self-pay | Admitting: Internal Medicine

## 2013-02-08 ENCOUNTER — Telehealth: Payer: Self-pay | Admitting: Internal Medicine

## 2013-02-08 NOTE — Telephone Encounter (Signed)
Patient advised of recommendations in email sent by Dr. Leone Payor.  Open zegerid capsule and mix with 4-8 oz of water.  She verbalized understanding

## 2013-02-08 NOTE — Telephone Encounter (Signed)
Left message for patient to call back  

## 2013-03-07 ENCOUNTER — Ambulatory Visit: Payer: Medicare Other | Admitting: Internal Medicine

## 2013-03-09 ENCOUNTER — Ambulatory Visit (INDEPENDENT_AMBULATORY_CARE_PROVIDER_SITE_OTHER): Payer: Medicare Other | Admitting: Internal Medicine

## 2013-03-09 ENCOUNTER — Encounter: Payer: Self-pay | Admitting: Internal Medicine

## 2013-03-09 ENCOUNTER — Ambulatory Visit: Payer: Medicare Other | Admitting: Internal Medicine

## 2013-03-09 ENCOUNTER — Other Ambulatory Visit (INDEPENDENT_AMBULATORY_CARE_PROVIDER_SITE_OTHER): Payer: Medicare Other

## 2013-03-09 VITALS — BP 144/72 | HR 86 | Ht 66.0 in | Wt 132.0 lb

## 2013-03-09 DIAGNOSIS — K219 Gastro-esophageal reflux disease without esophagitis: Secondary | ICD-10-CM

## 2013-03-09 DIAGNOSIS — M542 Cervicalgia: Secondary | ICD-10-CM

## 2013-03-09 DIAGNOSIS — J040 Acute laryngitis: Secondary | ICD-10-CM | POA: Diagnosis not present

## 2013-03-09 DIAGNOSIS — J029 Acute pharyngitis, unspecified: Secondary | ICD-10-CM

## 2013-03-09 DIAGNOSIS — E069 Thyroiditis, unspecified: Secondary | ICD-10-CM | POA: Diagnosis not present

## 2013-03-09 LAB — T3: T3, Total: 72.7 ng/dL — ABNORMAL LOW (ref 80.0–204.0)

## 2013-03-09 LAB — BASIC METABOLIC PANEL
BUN: 18 mg/dL (ref 6–23)
Calcium: 9.7 mg/dL (ref 8.4–10.5)
Chloride: 102 mEq/L (ref 96–112)
Creatinine, Ser: 1 mg/dL (ref 0.4–1.2)
Glucose, Bld: 122 mg/dL — ABNORMAL HIGH (ref 70–99)

## 2013-03-09 LAB — T4: T4, Total: 7.8 ug/dL (ref 5.0–12.5)

## 2013-03-09 MED ORDER — OMEPRAZOLE-SODIUM BICARBONATE 40-1100 MG PO CAPS: 1.0000 | ORAL_CAPSULE | Freq: Every day | ORAL | Status: DC

## 2013-03-09 NOTE — Progress Notes (Signed)
         Subjective:    Patient ID: Brianna Harper, female    DOB: [birth year], 67 y.o.   MRN: [id number]  HPI The patient is here with her husband for followup. She's had a sore throat and neck pain for months. Began in June. She saw ENT and she had erythema around the vocal cords in the laryngeal area of the pharynx and is diagnosed with reflux. She's been on a PPI, and she's been on high dose twice a day for at least 2 months now. Nocturnal reflux is improved after using Zegerid at bedtime in addition to omeprazole 40 mg in the morning. She still has a persistent sore throat and a sense of swelling and there are that may be and radiating up into the years with a pressure-like sensation at times when she lies down. She does not have any odynophagia or dysphagia. There is no cough. There was one episode of transient hoarseness. No pain with range of motion in the neck but it is sore and tender in that area. There is no sinus drainage there are no headaches. Her symptoms do not really sound like globus. Medications, allergies, past medical history, past surgical history, family history and social history are reviewed and updated in the EMR.   Review of Systems As per history of present illness    Objective:   Physical Exam Well-developed well-nourished looking younger than stated age Mouth posterior pharynx free of lesions The neck is supple without thyromegaly mass or lymphadenopathy. There is some tenderness over the midline area where the thyroid is no is not enlarged. No nodules. The trachea is midline. No supraclavicular adenopathy. No carotid bruits. Range of motion intact.    Assessment & Plan:   1. Neck pain   2. Laryngitis from reflux of stomach acid- ?   3. Sore throat

## 2013-03-09 NOTE — Assessment & Plan Note (Signed)
Though some of these symptoms could be from GERD, I'm not convinced and I think imaging in the neck is appropriate so we'll pursue a CT of the neck.

## 2013-03-09 NOTE — Assessment & Plan Note (Signed)
Cause of this still not clear. Still could have atypical or proximal reflux. She has been on a PPI twice a day for at least 2 months. My sense is that is probably not the case but is a difficult diagnosis to be sure of. We'll proceed with a CT of the neck, recheck thyroids, thyroiditis is a possibility though probably not I think we need to check. She needs reassurance as well. Note that she's had problems in the past, as this problem was entered in 2012, though she responded to PPI in the past.

## 2013-03-09 NOTE — Patient Instructions (Addendum)
Your physician has requested that you go to the basement for the following lab work before leaving today: TSH, T3, T4, BMET  Today you have been given samples of Zegerid to use as directed.  You have been scheduled for a CT scan of the abdomen and pelvis at [facility] ([address]---this is in the same building as [facility]).   You are scheduled on 03/15/13 at 2:30pm. You should arrive 15 minutes prior to your appointment time for registration. Please follow the written instructions below on the day of your exam:  WARNING: IF YOU ARE ALLERGIC TO IODINE/X-RAY DYE, PLEASE NOTIFY RADIOLOGY IMMEDIATELY AT [phone]! YOU WILL BE GIVEN A 13 HOUR PREMEDICATION PREP.  1) Do not eat or drink anything after 12:30pm (2 hours prior to your test)  You may take any medications as prescribed with a small amount of water except for the following: Metformin, Glucophage, Glucovance, Avandamet, Riomet, Fortamet, Actoplus Met, Janumet, Glumetza or Metaglip. The above medications must be held the day of the exam AND 48 hours after the exam.  The purpose of you drinking the oral contrast is to aid in the visualization of your intestinal tract. The contrast solution may cause some diarrhea. Before your exam is started, you will be given a small amount of fluid to drink. Depending on your individual set of symptoms, you may also receive an intravenous injection of x-ray contrast/dye. Plan on being at [facility] for 30 minutes or long, depending on the type of exam you are having performed.  This test typically takes 30-45 minutes to complete.  If you have any questions regarding your exam or if you need to reschedule, you may call the CT department at [phone] between the hours of 8:00 am and 5:00 pm, Monday-Friday.  ________________________________________________________________________  We will be in touch with results and plans.  I appreciate the opportunity to care for  you.

## 2013-03-09 NOTE — Assessment & Plan Note (Addendum)
Nocturnal sxs better w/ hs Zegerid but still with sore throat. Going to get a CT of the neck. Thyroid tests. May need to consider repeating an upper endoscopy though that he'll is probably low in light have to do that. Consider empiric benzodiazepine. Consider pH probe versus esophageal impedance study.

## 2013-03-12 NOTE — Progress Notes (Signed)
Quick Note:    Thyroid tests are ok  ______

## 2013-03-15 ENCOUNTER — Ambulatory Visit (INDEPENDENT_AMBULATORY_CARE_PROVIDER_SITE_OTHER)
Admission: RE | Admit: 2013-03-15 | Discharge: 2013-03-15 | Disposition: A | Discharge status: Home / Self Care | Payer: Medicare Other | Source: Ambulatory Visit | Attending: Internal Medicine | Admitting: Internal Medicine

## 2013-03-15 DIAGNOSIS — M542 Cervicalgia: Secondary | ICD-10-CM

## 2013-03-15 DIAGNOSIS — J029 Acute pharyngitis, unspecified: Secondary | ICD-10-CM | POA: Diagnosis not present

## 2013-03-15 MED ORDER — IOHEXOL 300 MG/ML  SOLN
80.0000 mL | Freq: Once | INTRAMUSCULAR | Status: AC | PRN
  Administered 2013-03-15: 80 mL via INTRAVENOUS

## 2013-03-16 ENCOUNTER — Encounter: Payer: Self-pay | Admitting: Internal Medicine

## 2013-03-16 ENCOUNTER — Other Ambulatory Visit: Payer: Self-pay

## 2013-03-16 MED ORDER — CLONAZEPAM 1 MG PO TABS: ORAL_TABLET | ORAL | Status: DC

## 2013-03-16 NOTE — Progress Notes (Signed)
Quick Note:  Results and plans sent by My Chart Needs Rx Clonazepam 1 mg qhs regularly and tid prn neck pain/anxiety #90 no refill ______

## 2013-03-16 NOTE — Progress Notes (Signed)
Quick Note:  No significant findings to explain problems of neck pain and sore throat ______

## 2013-03-27 ENCOUNTER — Encounter: Payer: Self-pay | Admitting: Internal Medicine

## 2013-04-02 ENCOUNTER — Encounter: Payer: Self-pay | Admitting: Internal Medicine

## 2013-04-15 ENCOUNTER — Encounter (HOSPITAL_COMMUNITY): Payer: Self-pay | Admitting: Emergency Medicine

## 2013-04-15 ENCOUNTER — Observation Stay (HOSPITAL_COMMUNITY): Payer: Medicare Other

## 2013-04-15 ENCOUNTER — Observation Stay (HOSPITAL_COMMUNITY)
Admission: EM | Admit: 2013-04-15 | Discharge: 2013-04-16 | Disposition: A | Discharge status: Home / Self Care | Payer: Medicare Other | Attending: Internal Medicine | Admitting: Internal Medicine

## 2013-04-15 ENCOUNTER — Emergency Department (HOSPITAL_COMMUNITY): Payer: Medicare Other

## 2013-04-15 ENCOUNTER — Telehealth: Payer: Self-pay | Admitting: Internal Medicine

## 2013-04-15 DIAGNOSIS — F411 Generalized anxiety disorder: Secondary | ICD-10-CM | POA: Diagnosis not present

## 2013-04-15 DIAGNOSIS — M899 Disorder of bone, unspecified: Secondary | ICD-10-CM | POA: Diagnosis not present

## 2013-04-15 DIAGNOSIS — F329 Major depressive disorder, single episode, unspecified: Secondary | ICD-10-CM | POA: Diagnosis not present

## 2013-04-15 DIAGNOSIS — R4182 Altered mental status, unspecified: Secondary | ICD-10-CM | POA: Diagnosis not present

## 2013-04-15 DIAGNOSIS — M949 Disorder of cartilage, unspecified: Secondary | ICD-10-CM

## 2013-04-15 DIAGNOSIS — Z79899 Other long term (current) drug therapy: Secondary | ICD-10-CM | POA: Insufficient documentation

## 2013-04-15 DIAGNOSIS — R6889 Other general symptoms and signs: Secondary | ICD-10-CM | POA: Diagnosis not present

## 2013-04-15 DIAGNOSIS — E785 Hyperlipidemia, unspecified: Secondary | ICD-10-CM | POA: Diagnosis not present

## 2013-04-15 DIAGNOSIS — K219 Gastro-esophageal reflux disease without esophagitis: Secondary | ICD-10-CM | POA: Diagnosis present

## 2013-04-15 DIAGNOSIS — I10 Essential (primary) hypertension: Secondary | ICD-10-CM | POA: Diagnosis not present

## 2013-04-15 DIAGNOSIS — F3289 Other specified depressive episodes: Secondary | ICD-10-CM | POA: Diagnosis not present

## 2013-04-15 DIAGNOSIS — J040 Acute laryngitis: Secondary | ICD-10-CM | POA: Insufficient documentation

## 2013-04-15 DIAGNOSIS — R413 Other amnesia: Secondary | ICD-10-CM | POA: Diagnosis not present

## 2013-04-15 DIAGNOSIS — G43909 Migraine, unspecified, not intractable, without status migrainosus: Secondary | ICD-10-CM | POA: Insufficient documentation

## 2013-04-15 DIAGNOSIS — G459 Transient cerebral ischemic attack, unspecified: Secondary | ICD-10-CM

## 2013-04-15 HISTORY — DX: Essential (primary) hypertension: I10

## 2013-04-15 LAB — URINALYSIS, ROUTINE W REFLEX MICROSCOPIC
Bilirubin Urine: NEGATIVE
Glucose, UA: NEGATIVE mg/dL
Hgb urine dipstick: NEGATIVE
Ketones, ur: NEGATIVE mg/dL
LEUKOCYTES UA: NEGATIVE
Nitrite: NEGATIVE
Protein, ur: NEGATIVE mg/dL
Specific Gravity, Urine: 1.013 (ref 1.005–1.030)
Urobilinogen, UA: 0.2 mg/dL (ref 0.0–1.0)
pH: 7.5 (ref 5.0–8.0)

## 2013-04-15 LAB — RAPID URINE DRUG SCREEN, HOSP PERFORMED
Amphetamines: NOT DETECTED
Barbiturates: NOT DETECTED
Benzodiazepines: NOT DETECTED
Cocaine: NOT DETECTED
OPIATES: NOT DETECTED
TETRAHYDROCANNABINOL: NOT DETECTED

## 2013-04-15 LAB — COMPREHENSIVE METABOLIC PANEL
ALBUMIN: 4.2 g/dL (ref 3.5–5.2)
ALT: 11 U/L (ref 0–35)
AST: 14 U/L (ref 0–37)
Alkaline Phosphatase: 55 U/L (ref 39–117)
BUN: 18 mg/dL (ref 6–23)
CALCIUM: 9.3 mg/dL (ref 8.4–10.5)
CO2: 25 mEq/L (ref 19–32)
CREATININE: 0.81 mg/dL (ref 0.50–1.10)
Chloride: 102 mEq/L (ref 96–112)
GFR calc non Af Amer: 74 mL/min — ABNORMAL LOW (ref >90)
GFR, EST AFRICAN AMERICAN: 86 mL/min — AB (ref >90)
GLUCOSE: 123 mg/dL — AB (ref 70–99)
Potassium: 3.8 mEq/L (ref 3.7–5.3)
Sodium: 142 mEq/L (ref 137–147)
TOTAL PROTEIN: 7 g/dL (ref 6.0–8.3)
Total Bilirubin: 0.6 mg/dL (ref 0.3–1.2)

## 2013-04-15 LAB — CBC
HEMATOCRIT: 40.6 % (ref 36.0–46.0)
HEMOGLOBIN: 14 g/dL (ref 12.0–15.0)
MCH: 31.4 pg (ref 26.0–34.0)
MCHC: 34.5 g/dL (ref 30.0–36.0)
MCV: 91 fL (ref 78.0–100.0)
Platelets: 208 10*3/uL (ref 150–400)
RBC: 4.46 MIL/uL (ref 3.87–5.11)
RDW: 12.9 % (ref 11.5–15.5)
WBC: 5.7 10*3/uL (ref 4.0–10.5)

## 2013-04-15 LAB — ETHANOL: Alcohol, Ethyl (B): <11 mg/dL (ref 0–11)

## 2013-04-15 LAB — GLUCOSE, CAPILLARY: GLUCOSE-CAPILLARY: 101 mg/dL — AB (ref 70–99)

## 2013-04-15 MED ORDER — ENOXAPARIN SODIUM 40 MG/0.4ML ~~LOC~~ SOLN
40.0000 mg | SUBCUTANEOUS | Status: DC
  Administered 2013-04-15: 40 mg via SUBCUTANEOUS
  Filled 2013-04-15 (×2): qty 0.4

## 2013-04-15 MED ORDER — LORATADINE 10 MG PO TABS
10.0000 mg | ORAL_TABLET | Freq: Every day | ORAL | Status: DC | PRN
  Filled 2013-04-15: qty 1

## 2013-04-15 MED ORDER — VERAPAMIL HCL ER 180 MG PO TBCR
180.0000 mg | EXTENDED_RELEASE_TABLET | Freq: Every day | ORAL | Status: DC
  Administered 2013-04-15: 180 mg via ORAL
  Filled 2013-04-15 (×2): qty 1

## 2013-04-15 MED ORDER — SODIUM CHLORIDE 0.9 % IV SOLN: INTRAVENOUS | Status: DC

## 2013-04-15 MED ORDER — PANTOPRAZOLE SODIUM 40 MG PO TBEC: 40.0000 mg | DELAYED_RELEASE_TABLET | Freq: Every day | ORAL | Status: DC

## 2013-04-15 MED ORDER — ASPIRIN 325 MG PO TABS
325.0000 mg | ORAL_TABLET | Freq: Every day | ORAL | Status: DC
  Administered 2013-04-15: 325 mg via ORAL
  Filled 2013-04-15 (×2): qty 1

## 2013-04-15 MED ORDER — CLONAZEPAM 1 MG PO TABS
1.0000 mg | ORAL_TABLET | Freq: Every day | ORAL | Status: DC
  Administered 2013-04-15: 1 mg via ORAL
  Filled 2013-04-15: qty 1

## 2013-04-15 MED ORDER — PANTOPRAZOLE SODIUM 40 MG PO TBEC
40.0000 mg | DELAYED_RELEASE_TABLET | Freq: Every day | ORAL | Status: DC
  Filled 2013-04-15: qty 1

## 2013-04-15 MED ORDER — SODIUM CHLORIDE 0.9 % IV SOLN
INTRAVENOUS | Status: DC
  Administered 2013-04-15: 50 mL/h via INTRAVENOUS

## 2013-04-15 MED ORDER — OMEPRAZOLE 20 MG PO CPDR
20.0000 mg | DELAYED_RELEASE_CAPSULE | Freq: Every day | ORAL | Status: DC
  Filled 2013-04-15 (×2): qty 1

## 2013-04-15 MED ORDER — NON FORMULARY: Freq: Every day | Status: DC

## 2013-04-15 NOTE — Telephone Encounter (Signed)
[facility] Triage Call Report Triage Record Num: [id number] Operator: Wells Guiles Patient Name: Brianna Harper Call Date & Time: 04/15/2013 11:18:24AM Patient Phone: [phone] PCP: Standley Brooking. Panosh Patient Gender: Female PCP Fax : [phone] Patient DOB: 02-May-1945 Practice Name: [facility] Reason for Call: Caller: Jeff/Spouse; PCP: Shanon Ace (Family Practice); CB#: [phone]; Call regarding having reflux and it was causing her throat and neck to hurt-ongoing problem for past 6 month. Recently put on new med for Anxiety-one week ago: Clonazepam 1 mg taking 1 tab PO at HS. Husband very concerned because she is having memory loss of recent events of last night and this morning- onset 11:10-1/18/15; Her speech seems slow. Triage and Care advice per Confusion, Disorientation, Aggitation Protocol and 911 advised for "New or worsening neuro deficits AND new or worsening confusion, disorentation or confusion." Protocol(s) Used: Confusion, Disorientation, Agitation Recommended Outcome per Protocol: Activate EMS 911 Reason for Outcome: New or worsening neuro deficits AND new or worsening confusion, disorientation or agitation Care Advice: ~ Protect the patient from falling or other harm. ~ Do not give the patient anything to eat or drink. ~ IMMEDIATE ACTION Write down provider's name. List or place the following in a bag for transport with the patient: current prescription and/or nonprescription medications; alternative treatments, therapies and medications; and street drugs. ~ An adult should stay with the patient, preferably one trained in CPR. If the person is not trained in CPR, then he or she should provide hands-only (compression-only) CPR as recommended by the American [employer] Association. ~ 01/

## 2013-04-15 NOTE — ED Provider Notes (Signed)
CSN: [id number]     Arrival date & time 04/15/13  1235 History   First MD Initiated Contact with Patient 04/15/13 1243     Chief Complaint  Patient presents with  . Altered Mental Status   (Consider location/radiation/quality/duration/timing/severity/associated sxs/prior Treatment) HPI Patient presents with the acute onset of amnesia. Symptoms began approximately one point prior to my evaluation.  The patient states that she was in her usual state of health prior to that time.  This is corroborated by the patient's husband. He does state that symptoms began abruptly, and the patient subsequently unable to recall any events from earlier in the day. Over the interval patient is unable to recall some events, though with poor clarity.  patient is currently improving recall issues., according to her and her husband, though she continues to complain of amnesia. Patient has a denied any pain throughout, any weakness or any visual changes throughout. She has no similar prior episode.  The only notable recent medication changes initiation of clonazepam, though this began 3 weeks ago.   Past Medical History  Diagnosis Date  . Anxiety   . Depression   . Hyperlipidemia   . GERD (gastroesophageal reflux disease)   . Osteopenia     dexa -2.0 hip 2008  . Migraine   . Rapid heart rate     hx of   . PMS (premenstrual syndrome)   . Laryngitis   . Hypertension    Past Surgical History  Procedure Laterality Date  . Cesarean section      x 2  . Colonoscopy  02/03/2004    internal hemorrhoids  . Upper gastrointestinal endoscopy  08/29/2008    gastritis, hiatal hernia   Family History  Problem Relation Age of Onset  . Hypertension Mother   . Arthritis Father   . Ovarian cancer Maternal Grandmother    History  Substance Use Topics  . Smoking status: Never Smoker   . Smokeless tobacco: Never Used  . Alcohol Use: No   OB History   Grav Para Term Preterm Abortions TAB SAB Ect Mult Living                  Review of Systems  Constitutional:       Per HPI, otherwise negative  HENT:       Per HPI, otherwise negative  Respiratory:       Per HPI, otherwise negative  Cardiovascular:       Per HPI, otherwise negative  Gastrointestinal: Negative for vomiting.  Endocrine:       Negative aside from HPI  Genitourinary:       Neg aside from HPI   Musculoskeletal:       Per HPI, otherwise negative  Skin: Negative.   Neurological: Negative for syncope.  Psychiatric/Behavioral:       History of present illness    Allergies  Nitrofurantoin; Erythromycin ethylsuccinate; and Sulfadiazine  Home Medications   Current Outpatient Rx  Name  Route  Sig  Dispense  Refill  . clonazePAM (KLONOPIN) 1 MG tablet   Oral   Take 1 mg by mouth See admin instructions. 1 mg qhs regularly and tid prn neck pain/anxiety         . loratadine (CLARITIN) 10 MG tablet   Oral   Take 10 mg by mouth daily as needed for allergies or rhinitis.         Marland Kitchen omeprazole (PRILOSEC) 40 MG capsule   Oral   Take 40 mg by  mouth daily with breakfast.          . omeprazole-sodium bicarbonate (ZEGERID) 40-1100 MG per capsule   Oral   Take 1 capsule by mouth at bedtime.   30 capsule   0     Samples given to patient    Lot# [id number]            ...   . rizatriptan (MAXALT) 10 MG tablet   Oral   Take 10 mg by mouth as needed. May repeat in 2 hours if needed          . triamterene-hydrochlorothiazide (MAXZIDE-25) 37.5-25 MG per tablet   Oral   Take 0.5 tablets by mouth daily.         . verapamil (CALAN-SR) 180 MG CR tablet   Oral   Take 180 mg by mouth at bedtime.         Marland Kitchen trimethoprim (TRIMPEX) 100 MG tablet   Oral   Take 100 mg by mouth as needed (uti prevention). Take 1 po as directed for uti prevention          BP 145/66  Pulse 88  Temp(Src) 98.1 F (36.7 C) (Oral)  Resp 13  Ht 5\' 6"  (1.676 m)  Wt 130 lb (58.968 kg)  BMI 20.99 kg/m2  SpO2 98% Physical Exam  Nursing note  and vitals reviewed. Constitutional: She is oriented to person, place, and time. She appears well-developed and well-nourished. No distress.  HENT:  Head: Normocephalic and atraumatic.  Eyes: Conjunctivae and EOM are normal.  Cardiovascular: Normal rate and regular rhythm.   Pulmonary/Chest: Effort normal and breath sounds normal. No stridor. No respiratory distress.  Abdominal: She exhibits no distension.  Musculoskeletal: She exhibits no edema.  Neurological: She is alert and oriented to person, place, and time. She displays no atrophy and no tremor. No cranial nerve deficit or sensory deficit. She exhibits normal muscle tone. She displays no seizure activity. Coordination normal.  Skin: Skin is warm and dry.  Psychiatric: She has a normal mood and affect. Her speech is normal. She is withdrawn. She exhibits abnormal recent memory. She exhibits normal remote memory.    ED Course  Procedures (including critical care time) Labs Review Labs Reviewed  COMPREHENSIVE METABOLIC PANEL - Abnormal; Notable for the following:    Glucose, Bld 123 (*)    GFR calc non Af Amer 74 (*)    GFR calc Af Amer 86 (*)    All other components within normal limits  GLUCOSE, CAPILLARY - Abnormal; Notable for the following:    Glucose-Capillary 101 (*)    All other components within normal limits  CBC  URINALYSIS, ROUTINE W REFLEX MICROSCOPIC  ETHANOL  URINE RAPID DRUG SCREEN (HOSP PERFORMED)   Imaging Review No results found.  EKG Interpretation    Date/Time:  Sunday April 15 2013 12:42:55 EST Ventricular Rate:  95 PR Interval:  156 QRS Duration: 96 QT Interval:  382 QTC Calculation: 480 R Axis:   87 Text Interpretation:  Sinus rhythm Consider right atrial enlargement Borderline right axis deviation Sinus rhythm Artifact Rightward axis Abnormal ekg Confirmed by Carmin Muskrat  MD [phone]) on 04/15/2013 2:35:03 PM           After the initial evaluation I reviewed the patient's  presentation with our neurologist.   By the time of neurology evaluation the patient has recovered her ability to recall all events and has no ongoing complaints. On this is concerning for possible TIA versus transient global  amnesia. MDM  No diagnosis found. Patient presents after the acute onset of amnesia.  Over the ED course the patient    regains full memory, and has no decompensation.  With this new acute neurologic change there is some concern for TIA.  Patient was admitted for further evaluation and management after I discussed her care with our neurology team.    Carmin Muskrat, MD 04/15/13 [phone]

## 2013-04-15 NOTE — ED Notes (Addendum)
Received pt from home with c/o while talking with her husband pt loss about 2 hours of memory. Pt stroke scale negative for EMS. Pt AAOx3 at present. Speech clear, tongue midline, grips equal B/L. Husband came into ED room at 1241 confirm that pt was last seen normal at 1110. Pt started clonazepam recently.

## 2013-04-15 NOTE — H&P (Signed)
[facility] History and Physical  ELANIE HAMMITT XTK:[id number] DOB: 01-30-1946 DOA: 04/15/2013  Referring physician: ED Physician.  PCP: Lottie Dawson, MD   Chief Complaint:Memory loss, amnesia.   HPI: Brianna Harper is a 68 y.o. female with PMH significant for Migraine, Depression who was brought to ED by husband due to confusion and memory problems that started morning of admission. Patient didn't recall events from this morning per husband. She didn't recall that she spoke with her son over phone for over an hour. She has started to remember events from this morning.  She denies headaches, weakness, fever, cough, chest pain.    Review of Systems:  Negative except as per HPI.   Past Medical History  Diagnosis Date  . Anxiety   . Depression   . Hyperlipidemia   . GERD (gastroesophageal reflux disease)   . Osteopenia     dexa -2.0 hip 2008  . Migraine   . Rapid heart rate     hx of   . PMS (premenstrual syndrome)   . Laryngitis   . Hypertension    Past Surgical History  Procedure Laterality Date  . Cesarean section      x 2  . Colonoscopy  02/03/2004    internal hemorrhoids  . Upper gastrointestinal endoscopy  08/29/2008    gastritis, hiatal hernia   Social History:  reports that she has never smoked. She has never used smokeless tobacco. She reports that she does not drink alcohol or use illicit drugs.  Allergies  Allergen Reactions  . Nitrofurantoin Hives and Swelling  . Erythromycin Ethylsuccinate     REACTION: GI upset  . Sulfadiazine     REACTION: rash    Family History  Problem Relation Age of Onset  . Hypertension Mother   . Arthritis Father   . Ovarian cancer Maternal Grandmother      Prior to Admission medications   Medication Sig Start Date End Date Taking? Authorizing Provider  clonazePAM (KLONOPIN) 1 MG tablet Take 1 mg by mouth See admin instructions. 1 mg qhs regularly and tid prn neck pain/anxiety 03/16/13  Yes Gatha Mayer,  MD  loratadine (CLARITIN) 10 MG tablet Take 10 mg by mouth daily as needed for allergies or rhinitis.   Yes Historical Provider, MD  omeprazole (PRILOSEC) 40 MG capsule Take 40 mg by mouth daily with breakfast.    Yes Historical Provider, MD  omeprazole-sodium bicarbonate (ZEGERID) 40-1100 MG per capsule Take 1 capsule by mouth at bedtime. 03/09/13  Yes Gatha Mayer, MD  rizatriptan (MAXALT) 10 MG tablet Take 10 mg by mouth as needed. May repeat in 2 hours if needed    Yes Historical Provider, MD  triamterene-hydrochlorothiazide (MAXZIDE-25) 37.5-25 MG per tablet Take 0.5 tablets by mouth daily. 07/20/12  Yes Burnis Medin, MD  verapamil (CALAN-SR) 180 MG CR tablet Take 180 mg by mouth at bedtime.   Yes Historical Provider, MD  trimethoprim (TRIMPEX) 100 MG tablet Take 100 mg by mouth as needed (uti prevention). Take 1 po as directed for uti prevention 05/01/12   Burnis Medin, MD   Physical Exam: Filed Vitals:   04/15/13 1615  BP: 147/70  Pulse: 80  Temp:   Resp: 13    BP 147/70  Pulse 80  Temp(Src) 98.1 F (36.7 C) (Oral)  Resp 13  Ht 5\' 6"  (1.676 m)  Wt 58.968 kg (130 lb)  BMI 20.99 kg/m2  SpO2 94%  General:  Appears calm and comfortable Eyes: PERRL,  normal lids, irises & conjunctiva ENT: grossly normal hearing, lips & tongue Neck: no LAD, masses or thyromegaly Cardiovascular: RRR, no m/r/g. No LE edema. Telemetry: SR, no arrhythmias  Respiratory: CTA bilaterally, no w/r/r. Normal respiratory effort. Abdomen: soft, ntnd Skin: no rash or induration seen on limited exam Musculoskeletal: grossly normal tone BUE/BLE Psychiatric: grossly normal mood and affect, speech fluent and appropriate Neurologic: grossly non-focal.          Labs on Admission:  Basic Metabolic Panel:  Recent Labs Lab 04/15/13 1250  NA 142  K 3.8  CL 102  CO2 25  GLUCOSE 123*  BUN 18  CREATININE 0.81  CALCIUM 9.3   Liver Function Tests:  Recent Labs Lab 04/15/13 1250  AST 14  ALT 11   ALKPHOS 55  BILITOT 0.6  PROT 7.0  ALBUMIN 4.2   No results found for this basename: LIPASE, AMYLASE,  in the last 168 hours No results found for this basename: AMMONIA,  in the last 168 hours CBC:  Recent Labs Lab 04/15/13 1250  WBC 5.7  HGB 14.0  HCT 40.6  MCV 91.0  PLT 208   Cardiac Enzymes: No results found for this basename: CKTOTAL, CKMB, CKMBINDEX, TROPONINI,  in the last 168 hours  BNP (last 3 results) No results found for this basename: PROBNP,  in the last 8760 hours CBG:  Recent Labs Lab 04/15/13 1301  GLUCAP 101*    Radiological Exams on Admission: Ct Head Wo Contrast  04/15/2013   CLINICAL DATA:  Playing of memory loss, rule out stroke  EXAM: CT HEAD WITHOUT CONTRAST  TECHNIQUE: Contiguous axial images were obtained from the base of the skull through the vertex without intravenous contrast.  COMPARISON:  03/15/2013  FINDINGS: Scattered ethmoid air cell opacification bilaterally. Mild sphenoid and maxillary sinus inflammatory change as well. Calvarium intact. No hemorrhage, infarct, mass, or hydrocephalus. No evidence of vascular territory infarct. No hydrocephalus.  IMPRESSION: Sinusitis, otherwise negative   Electronically Signed   By: Skipper Cliche M.D.   On: 04/15/2013 15:30    EKG: Independently reviewed. Sinus rhythm.   Assessment/Plan Active Problems:   HYPERLIPIDEMIA   GERD   Hypertension   Amnesia   TIA (transient ischemic attack)  1-Amnesia/Confusion: Differential: TIA, Seizure, Transient Global amnesia.  TIA work up, MRI, MRA brain. ECHO, Carotid doppler. Hb A1c. Start Aspirin. Ct Head negative for intracranial bleed.  Neuro has already seen the patient. EEG ordered.   2-HTN: continue with verapamil. Hold Maxi zide. Permissive HTN in case of acute stroke.  3-GERD; Continue with PPI.   Code Status: full Code.  Family Communication: Care discussed with husband who was at bedside.  Disposition Plan: admit under observation.   Time  spent: 75 minutes.   [facility] Pager [phone]

## 2013-04-15 NOTE — Consult Note (Signed)
Referring Physician: Dr Vanita Panda    Chief Complaint: transient amnesia  HPI: Brianna Harper is a 68 y.o. female with a history of severe gastroesophageal reflux disease, migraine headaches, anxiety with remote panic attacks, and hypertension. She is followed by Dr. Silvano Rusk for her reflux which has been treated with omeprazole. Recently she had a tickling sensation in her throat that became quite bothersome and she was placed on Klonopin to be taken 1 mg at bedtime. This allowed her to get a good night's sleep and also took away the sensation she was experiencing in her throat.  Today,04/15/2013, at approximately 11:10 AM the patient told her husband that she did not feel well. As they were talking her husband realized that the patient did not remember most of the events from that morning. She began to perseverate asking the same questions over and over again. Her husband called EMS and the patient was brought to [facility] emergency department. The patient has been under a significant amount of stress over the past month or so due to  family issues involving her parents.  As the patient and her husband were being interviewed in the emergency department the patient's memories from the morning began to return. She had not experienced other memory deficits for events prior to this morning. The plan is to admit the patient for a full stroke workup. The differential diagnosis also includes a TIA, a seizure, and transient global amnesia. The patient denies recent headache, head trauma, dizziness, or visual changes.  Date last known well: Date: 04/15/2013 Time last known well: Time: 11:10 tPA Given: No: Resolution of symptoms  Past Medical History  Diagnosis Date  . Anxiety   . Depression   . Hyperlipidemia   . GERD (gastroesophageal reflux disease)   . Osteopenia     dexa -2.0 hip 2008  . Migraine   . Rapid heart rate     hx of   . PMS (premenstrual syndrome)   . Laryngitis   . Hypertension      Past Surgical History  Procedure Laterality Date  . Cesarean section      x 2  . Colonoscopy  02/03/2004    internal hemorrhoids  . Upper gastrointestinal endoscopy  08/29/2008    gastritis, hiatal hernia    Family History  Problem Relation Age of Onset  . Hypertension Mother   . Arthritis Father   . Ovarian cancer Maternal Grandmother    Social History:  reports that she has never smoked. She has never used smokeless tobacco. She reports that she does not drink alcohol or use illicit drugs.  Allergies:  Allergies  Allergen Reactions  . Nitrofurantoin Hives and Swelling  . Erythromycin Ethylsuccinate     REACTION: GI upset  . Sulfadiazine     REACTION: rash    Medications:  No current facility-administered medications for this encounter.   Current Outpatient Prescriptions  Medication Sig Dispense Refill  . clonazePAM (KLONOPIN) 1 MG tablet Take 1 mg by mouth See admin instructions. 1 mg qhs regularly and tid prn neck pain/anxiety      . loratadine (CLARITIN) 10 MG tablet Take 10 mg by mouth daily as needed for allergies or rhinitis.      Marland Kitchen omeprazole (PRILOSEC) 40 MG capsule Take 40 mg by mouth daily with breakfast.       . omeprazole-sodium bicarbonate (ZEGERID) 40-1100 MG per capsule Take 1 capsule by mouth at bedtime.  30 capsule  0  . rizatriptan (MAXALT) 10 MG  tablet Take 10 mg by mouth as needed. May repeat in 2 hours if needed       . triamterene-hydrochlorothiazide (MAXZIDE-25) 37.5-25 MG per tablet Take 0.5 tablets by mouth daily.      . verapamil (CALAN-SR) 180 MG CR tablet Take 180 mg by mouth at bedtime.      Marland Kitchen trimethoprim (TRIMPEX) 100 MG tablet Take 100 mg by mouth as needed (uti prevention). Take 1 po as directed for uti prevention       :  ROS: History obtained from the patient and and husband  General ROS: negative for - chills, fatigue, fever, night sweats, weight gain or weight loss Psychological ROS: negative for - behavioral disorder,  hallucinations, memory difficulties, mood swings or suicidal ideation Ophthalmic ROS: negative for - blurry vision, double vision, eye pain or loss of vision ENT ROS: negative for - epistaxis, nasal discharge, oral lesions, sore throat, tinnitus or vertigo. Positive for a tickling sensation in her throat that resolved with Klonopin. Allergy and Immunology ROS: negative for - hives or itchy/watery eyes Hematological and Lymphatic ROS: negative for - bleeding problems, bruising or swollen lymph nodes Endocrine ROS: negative for - galactorrhea, hair pattern changes, polydipsia/polyuria or temperature intolerance Respiratory ROS: negative for - cough, hemoptysis, shortness of breath or wheezing Cardiovascular ROS: negative for - chest pain, dyspnea on exertion, edema or irregular heartbeat Gastrointestinal ROS: negative for - abdominal pain, diarrhea, hematemesis, nausea/vomiting or stool incontinence. Positive for GERD Genito-Urinary ROS: negative for - dysuria, hematuria, incontinence or urinary frequency/urgency Musculoskeletal ROS: negative for - joint swelling or muscular weakness Neurological ROS: as noted in HPI Dermatological ROS: negative for rash and skin lesion changes   Physical Examination: Blood pressure 161/80, pulse 92, temperature 97.7 F (36.5 C), temperature source Oral, resp. rate 18, height 5\' 6"  (1.676 m), weight 58.968 kg (130 lb), SpO2 100.00%.  Neurologic Examination: Mental Status: Alert, oriented, thought content appropriate. Unable to recall most of the events from this morning. Speech fluent without evidence of aphasia.  Able to follow 3 step commands without difficulty. Cranial Nerves: II: Discs not visualized; Visual fields grossly normal, pupils equal, round, reactive to light and accommodation III,IV, VI: ptosis not present, extra-ocular motions intact bilaterally V,VII: smile symmetric, facial light touch sensation normal bilaterally VIII: hearing normal  bilaterally IX,X: gag reflex present XI: bilateral shoulder shrug XII: midline tongue extension Motor: Right : Upper extremity   5/5    Left:     Upper extremity   5/5  Lower extremity   5/5     Lower extremity   5/5 Tone and bulk:normal tone throughout; no atrophy noted Sensory: light touch intact throughout, bilaterally Deep Tendon Reflexes: 2+ and symmetric throughout Plantars: Right: downgoing   Left: downgoing Cerebellar: normal finger-to-nose, normal rapid alternating movements and normal heel-to-shin test Gait: normal gait and station CV: pulses palpable throughout   Laboratory Studies:  Basic Metabolic Panel:  Recent Labs Lab 04/15/13 1250  NA 142  K 3.8  CL 102  CO2 25  GLUCOSE 123*  BUN 18  CREATININE 0.81  CALCIUM 9.3    Liver Function Tests:  Recent Labs Lab 04/15/13 1250  AST 14  ALT 11  ALKPHOS 55  BILITOT 0.6  PROT 7.0  ALBUMIN 4.2   No results found for this basename: LIPASE, AMYLASE,  in the last 168 hours No results found for this basename: AMMONIA,  in the last 168 hours  CBC:  Recent Labs Lab 04/15/13 1250  WBC 5.7  HGB 14.0  HCT 40.6  MCV 91.0  PLT 208    Cardiac Enzymes: No results found for this basename: CKTOTAL, CKMB, CKMBINDEX, TROPONINI,  in the last 168 hours  BNP: No components found with this basename: POCBNP,   CBG:  Recent Labs Lab 04/15/13 1301  GLUCAP 101*    Microbiology: Results for orders placed in visit on 09/13/12  CULTURE, GROUP A STREP     Status: None   Collection Time    09/13/12  1:27 PM      Result Value Range Status   Organism ID, Bacteria Normal Upper Respiratory Flora   Final   Organism ID, Bacteria No Beta Hemolytic Streptococci Isolated   Final    Coagulation Studies: No results found for this basename: LABPROT, INR,  in the last 72 hours  Urinalysis: No results found for this basename: COLORURINE, APPERANCEUR, LABSPEC, PHURINE, GLUCOSEU, HGBUR, BILIRUBINUR, KETONESUR, PROTEINUR,  UROBILINOGEN, NITRITE, LEUKOCYTESUR,  in the last 168 hours  Lipid Panel:    Component Value Date/Time   CHOL 236* 05/01/2012 1107   TRIG 54.0 05/01/2012 1107   HDL 94.40 05/01/2012 1107   CHOLHDL 3 05/01/2012 1107   VLDL 10.8 05/01/2012 1107   LDLCALC 105* 12/05/2006 1024    HgbA1C:  No results found for this basename: HGBA1C    Urine Drug Screen:   No results found for this basename: labopia, cocainscrnur, labbenz, amphetmu, thcu, labbarb    Alcohol Level: No results found for this basename: ETH,  in the last 168 hours  Other results: EKG: sinus rhythm rate 95 beats per minute  Imaging: No results found.  Patient seen and examined.  Clinical course and management discussed.  Necessary edits performed.  I agree with the above.  Assessment and plan of care developed and discussed below.    Mikey Bussing PA-C [facility] Pager [phone] 04/15/2013, 3:22 PM  Assessment: 68 y.o. female presenting with memory deficits.  Symptoms resolved very soon after presentation.  Neurological examination is unremarkable.   Head CT reviewed and is unremarkable as well.  Although features very consistent with TGA, symptoms were present for a relatively short period of time.  Would like to rule out other possibilities as well, such as TIA and seizure.    Stroke Risk Factors - hyperlipidemia and hypertension  Plan: 1. HgbA1c, fasting lipid panel 2. MRI, MRA  of the brain without contrast 3. EEG 4. Echocardiogram 5. Carotid dopplers 6. Prophylactic therapy-Antiplatelet med: Aspirin - dose 81 mg daily 7. Risk factor modification 8. Telemetry monitoring 9. Frequent neuro checks  Case discussed with Dr. Kristine Garbe, MD [facility] [phone]  04/15/2013  4:00 PM

## 2013-04-15 NOTE — ED Notes (Signed)
Pt remains in MRI 

## 2013-04-15 NOTE — ED Notes (Signed)
Patient transported to MRI 

## 2013-04-16 ENCOUNTER — Observation Stay (HOSPITAL_COMMUNITY): Payer: Medicare Other

## 2013-04-16 ENCOUNTER — Encounter (HOSPITAL_COMMUNITY): Payer: Self-pay | Admitting: General Practice

## 2013-04-16 ENCOUNTER — Telehealth: Payer: Self-pay | Admitting: Internal Medicine

## 2013-04-16 DIAGNOSIS — R413 Other amnesia: Secondary | ICD-10-CM | POA: Diagnosis not present

## 2013-04-16 DIAGNOSIS — I517 Cardiomegaly: Secondary | ICD-10-CM | POA: Diagnosis not present

## 2013-04-16 DIAGNOSIS — G459 Transient cerebral ischemic attack, unspecified: Secondary | ICD-10-CM

## 2013-04-16 LAB — HEMOGLOBIN A1C
HEMOGLOBIN A1C: 6 % — AB (ref <5.7)
Mean Plasma Glucose: 126 mg/dL — ABNORMAL HIGH (ref <117)

## 2013-04-16 LAB — LIPID PANEL
CHOLESTEROL: 202 mg/dL — AB (ref 0–200)
HDL: 69 mg/dL (ref >39)
LDL Cholesterol: 117 mg/dL — ABNORMAL HIGH (ref 0–99)
TRIGLYCERIDES: 79 mg/dL (ref <150)
Total CHOL/HDL Ratio: 2.9 RATIO
VLDL: 16 mg/dL (ref 0–40)

## 2013-04-16 MED ORDER — ASPIRIN 81 MG PO CHEW
81.0000 mg | CHEWABLE_TABLET | Freq: Every day | ORAL | Status: DC
  Administered 2013-04-16: 81 mg via ORAL
  Filled 2013-04-16: qty 1

## 2013-04-16 MED ORDER — ASPIRIN 81 MG PO CHEW: 81.0000 mg | CHEWABLE_TABLET | Freq: Every day | ORAL | Status: DC

## 2013-04-16 NOTE — Progress Notes (Signed)
EEG Completed; Results Pending  

## 2013-04-16 NOTE — Procedures (Signed)
ELECTROENCEPHALOGRAM REPORT   Patient: Brianna Harper       Room #: 5Y09 EEG No. ID: [id number] Age: 68 y.o.        Sex: female Referring Physician: Rai Report Date:  04/16/2013        Interpreting Physician: Alexis Goodell D  History: Brianna Harper is an 68 y.o. female with an episode of amnesia  Medications:  Scheduled: . sodium chloride   Intravenous STAT  . aspirin  81 mg Oral Daily  . clonazePAM  1 mg Oral QHS  . enoxaparin (LOVENOX) injection  40 mg Subcutaneous Q24H  . omeprazole  20 mg Oral QAC breakfast  . verapamil  180 mg Oral QHS    Conditions of Recording:  This is a 16 channel EEG carried out with the patient in the awake, drowsy and asleep states.  Description:  The waking background activity consists of a low voltage, symmetrical, fairly well organized, 9 Hz alpha activity, seen from the parieto-occipital and posterior temporal regions.  Low voltage fast activity, poorly organized, is seen anteriorly and is at times superimposed on more posterior regions.  A mixture of theta and alpha rhythms are seen from the central and temporal regions. The patient drowses with slowing to irregular, low voltage theta and beta activity.   Stage II sleep is not obtained. Frequently during the tracing is noted sharp transients over  The left hemisphere with phase reversal at T3.   Hyperventilation and intermittent photic stimulation were not performed.  IMPRESSION: This is an abnormal electroencephalogram secondary to left temporal sharp transients.     Alexis Goodell, MD [facility] [phone] 04/16/2013, 5:02 PM

## 2013-04-16 NOTE — Progress Notes (Signed)
Subjective: Patient back to baseline.  No new events.    Objective: Current vital signs: BP 108/55  Pulse 72  Temp(Src) 97.8 F (36.6 C) (Oral)  Resp 18  Ht 5\' 6"  (1.676 m)  Wt 60.328 kg (133 lb)  BMI 21.48 kg/m2  SpO2 96% Vital signs in last 24 hours: Temp:  [97.7 F (36.5 C)-98.5 F (36.9 C)] 97.8 F (36.6 C) (01/19 0600) Pulse Rate:  [69-101] 72 (01/19 0600) Resp:  [11-25] 18 (01/19 0200) BP: (97-182)/(50-95) 108/55 mmHg (01/19 0600) SpO2:  [92 %-100 %] 96 % (01/19 0600) Weight:  [58.968 kg (130 lb)-60.328 kg (133 lb)] 60.328 kg (133 lb) (01/18 2000)  Intake/Output from previous day:   Intake/Output this shift:   Nutritional status: Cardiac  Neurologic Exam: Mental Status:  Alert, oriented, thought content appropriate. Unable to recall most of the events from this morning. Speech fluent without evidence of aphasia. Able to follow 3 step commands without difficulty.  Cranial Nerves:  II: Discs not visualized; Visual fields grossly normal, pupils equal, round, reactive to light and accommodation  III,IV, VI: ptosis not present, extra-ocular motions intact bilaterally  V,VII: smile symmetric, facial light touch sensation normal bilaterally  VIII: hearing normal bilaterally  IX,X: gag reflex present  XI: bilateral shoulder shrug  XII: midline tongue extension  Motor:  Right : Upper extremity 5/5         Left: Upper extremity 5/5   Lower extremity 5/5      Lower extremity 5/5  Tone and bulk:normal tone throughout; no atrophy noted  Sensory: light touch intact throughout, bilaterally  Deep Tendon Reflexes: 2+ and symmetric throughout  Plantars:  Right: downgoing   Left: downgoing  Cerebellar:  normal finger-to-nose, normal rapid alternating movements and normal heel-to-shin test     Lab Results: Basic Metabolic Panel:  Recent Labs Lab 04/15/13 1250  NA 142  K 3.8  CL 102  CO2 25  GLUCOSE 123*  BUN 18  CREATININE 0.81  CALCIUM 9.3    Liver Function  Tests:  Recent Labs Lab 04/15/13 1250  AST 14  ALT 11  ALKPHOS 55  BILITOT 0.6  PROT 7.0  ALBUMIN 4.2   No results found for this basename: LIPASE, AMYLASE,  in the last 168 hours No results found for this basename: AMMONIA,  in the last 168 hours  CBC:  Recent Labs Lab 04/15/13 1250  WBC 5.7  HGB 14.0  HCT 40.6  MCV 91.0  PLT 208    Cardiac Enzymes: No results found for this basename: CKTOTAL, CKMB, CKMBINDEX, TROPONINI,  in the last 168 hours  Lipid Panel:  Recent Labs Lab 04/16/13 0415  CHOL 202*  TRIG 79  HDL 69  CHOLHDL 2.9  VLDL 16  LDLCALC 117*    CBG:  Recent Labs Lab 04/15/13 1301  GLUCAP 101*    Microbiology: Results for orders placed in visit on 09/13/12  CULTURE, GROUP A STREP     Status: None   Collection Time    09/13/12  1:27 PM      Result Value Range Status   Organism ID, Bacteria Normal Upper Respiratory Flora   Final   Organism ID, Bacteria No Beta Hemolytic Streptococci Isolated   Final    Coagulation Studies: No results found for this basename: LABPROT, INR,  in the last 72 hours  Imaging: Ct Head Wo Contrast  04/15/2013   CLINICAL DATA:  Playing of memory loss, rule out stroke  EXAM: CT HEAD WITHOUT CONTRAST  TECHNIQUE: Contiguous axial images were obtained from the base of the skull through the vertex without intravenous contrast.  COMPARISON:  03/15/2013  FINDINGS: Scattered ethmoid air cell opacification bilaterally. Mild sphenoid and maxillary sinus inflammatory change as well. Calvarium intact. No hemorrhage, infarct, mass, or hydrocephalus. No evidence of vascular territory infarct. No hydrocephalus.  IMPRESSION: Sinusitis, otherwise negative   Electronically Signed   By: Skipper Cliche M.D.   On: 04/15/2013 15:30   Mr Jodene Nam Head Wo Contrast  04/15/2013   CLINICAL DATA:  Transient memory loss this morning.  EXAM: MRI HEAD WITHOUT CONTRAST  MRA HEAD WITHOUT CONTRAST  TECHNIQUE: Multiplanar, multiecho pulse sequences of the  brain and surrounding structures were obtained without intravenous contrast. Angiographic images of the head were obtained using MRA technique without contrast.  COMPARISON:  CT head from the same day. Report of MRI brain and MRA head 02/10/2001  FINDINGS: MRI HEAD FINDINGS  The diffusion-weighted images demonstrate no evidence for acute or subacute infarction. No hemorrhage or mass lesion is present. The ventricles are of normal size. Midline structures are normal. No significant extra-axial fluid collection is present.  Flow is present in the major intracranial arteries. The globes and orbits are intact. Mild mucosal disease is present in the maxillary and ethmoid sinuses bilaterally. There is mild mucosal thickening within hypoplastic frontal sinuses. Minimal mucosal thickening is present in the sphenoid sinuses bilaterally. The mastoid air cells are clear.  MRA HEAD FINDINGS  The internal carotid arteries are within normal limits from the high cervical segments through the ICA termini bilaterally. Slight prominence of the ophthalmic arteries are compatible with infundibulum of bilaterally. The A1 and M1 segments are normal. The anterior communicating artery is patent. ACA and MCA branch vessels are within normal limits. There is no evidence for aneurysm at either MCA bifurcation.  The vertebral arteries are codominant. The PICA origins are visualized and normal bilaterally. The basilar artery is within normal limits. Both posterior cerebral arteries originate from the basilar tip. The PCA branch vessels are unremarkable.  IMPRESSION: 1. Normal MRI appearance of brain. 2. Mild diffuse sinus disease. 3. Normal variant MRA circle of Willis without evidence for significant proximal stenosis, aneurysm, or branch vessel occlusion.   Electronically Signed   By: Lawrence Santiago M.D.   On: 04/15/2013 20:04   Mr Brain Wo Contrast  04/15/2013   CLINICAL DATA:  Transient memory loss this morning.  EXAM: MRI HEAD WITHOUT  CONTRAST  MRA HEAD WITHOUT CONTRAST  TECHNIQUE: Multiplanar, multiecho pulse sequences of the brain and surrounding structures were obtained without intravenous contrast. Angiographic images of the head were obtained using MRA technique without contrast.  COMPARISON:  CT head from the same day. Report of MRI brain and MRA head 02/10/2001  FINDINGS: MRI HEAD FINDINGS  The diffusion-weighted images demonstrate no evidence for acute or subacute infarction. No hemorrhage or mass lesion is present. The ventricles are of normal size. Midline structures are normal. No significant extra-axial fluid collection is present.  Flow is present in the major intracranial arteries. The globes and orbits are intact. Mild mucosal disease is present in the maxillary and ethmoid sinuses bilaterally. There is mild mucosal thickening within hypoplastic frontal sinuses. Minimal mucosal thickening is present in the sphenoid sinuses bilaterally. The mastoid air cells are clear.  MRA HEAD FINDINGS  The internal carotid arteries are within normal limits from the high cervical segments through the ICA termini bilaterally. Slight prominence of the ophthalmic arteries are compatible with infundibulum of bilaterally.  The A1 and M1 segments are normal. The anterior communicating artery is patent. ACA and MCA branch vessels are within normal limits. There is no evidence for aneurysm at either MCA bifurcation.  The vertebral arteries are codominant. The PICA origins are visualized and normal bilaterally. The basilar artery is within normal limits. Both posterior cerebral arteries originate from the basilar tip. The PCA branch vessels are unremarkable.  IMPRESSION: 1. Normal MRI appearance of brain. 2. Mild diffuse sinus disease. 3. Normal variant MRA circle of Willis without evidence for significant proximal stenosis, aneurysm, or branch vessel occlusion.   Electronically Signed   By: Lawrence Santiago M.D.   On: 04/15/2013 20:04    Medications:  I  have reviewed the patient's current medications. Scheduled: . sodium chloride   Intravenous STAT  . aspirin  81 mg Oral Daily  . clonazePAM  1 mg Oral QHS  . enoxaparin (LOVENOX) injection  40 mg Subcutaneous Q24H  . omeprazole  20 mg Oral QAC breakfast  . verapamil  180 mg Oral QHS    Assessment/Plan: Patient at baseline.  MRI of the brain reviewed and shows no  acute changes.  MRA unremarkable as well.  Carotid dopplers show no hemodynamically significant stenosis.  Echocardiogram is pending.  Differential remains TGA vs. TIA.  Patient on aspirin.  Recommendations: 1.  Will follow up results of echocardiogram.  If unremarkable patient may be discharged to home but to remain on an aspirin a day.   2.  No further neurologic intervention is recommended at this time.  If further questions arise, please call or page at that time.  Thank you for allowing neurology to participate in the care of this patient.    LOS: 1 day   Alexis Goodell, MD [facility] [phone] 04/16/2013  11:53 AM

## 2013-04-16 NOTE — Progress Notes (Signed)
UR completed 

## 2013-04-16 NOTE — Discharge Instructions (Addendum)
Please check BP at Home, if Blood pressure start to increase more than 130/80 call your Dr and you might need to resume Maxzide.   Stroke Prevention Some health problems and behaviors may make it more likely for you to have a stroke. Below are ways to lessen your risk of having a stroke.   Be active for at least 30 minutes on most or all days.  Do not smoke. Try not to be around others who smoke.  Do not drink too much alcohol.  Do not have more than 2 drinks a day if you are a man.  Do not have more than 1 drink a day if you are a woman and are not pregnant.  Eat healthy foods, such as fruits and vegetables. If you were put on a specific diet, follow the diet as told.  Keep your cholesterol levels under control through diet and medicines. Look for foods that are low in saturated fat, trans fat, cholesterol, and are high in fiber.  If you have diabetes, follow all diet plans and take your medicine as told.  If you have high blood pressure (hypertension), follow all diet plans and take your medicine as told.  Keep a healthy weight. Eat foods that are low in calories, salt, saturated fat, trans fat, and cholesterol.  Do not take drugs.  Avoid birth control pills, if this applies. Talk to your doctor about the risks of taking birth control pills.  Talk to your doctor if you have sleep problems (sleep apnea).  Take all medicine as told by your doctor.  You may be told to take aspirin or blood thinner medicine. Take this medicine as told by your doctor.  Understand your medicine instructions.  Make sure any other conditions you have are being taken care of. GET HELP RIGHT AWAY IF:  You suddenly lose feeling (you feel numb) or have weakness in your face, arm, or leg.  Your face or eyelid hangs down to one side.  You suddenly feel confused.  You have trouble talking (aphasia) or understanding what people are saying.  You suddenly have trouble seeing in one or both  eyes.  You suddenly have trouble walking.  You are dizzy.  You lose your balance or your movements are clumsy (uncoordinated).  You suddenly have a very bad headache and you do not know the cause.  You have new chest pain.  Your heart feels like it is fluttering or skipping a beat (irregular heartbeat). Do not wait to see if the symptoms above go away. Get help right away. Call your local emergency services (911 in [place] Do not drive yourself to the hospital. Document Released: 09/14/2011 Document Revised: 01/03/2013 Document Reviewed: 09/15/2012 [facility] Patient Information 2014 [place].   Cardiac Diet This diet can help prevent heart disease and stroke. Many factors influence your heart health, including eating and exercise habits. Coronary risk rises a lot with abnormal blood fat (lipid) levels. Cardiac meal planning includes limiting unhealthy fats, increasing healthy fats, and making other small dietary changes. General guidelines are as follows:  Adjust calorie intake to reach and maintain desirable body weight.  Limit total fat intake to less than 30% of total calories. Saturated fat should be less than 7% of calories.  Saturated fats are found in animal products and in some vegetable products. Saturated vegetable fats are found in coconut oil, cocoa butter, palm oil, and palm kernel oil. Read labels carefully to avoid these products as much as possible. Use butter  in moderation. Choose tub margarines and oils that have 2 grams of fat or less. Good cooking oils are canola and olive oils.  Practice low-fat cooking techniques. Do not fry food. Instead, broil, bake, boil, steam, grill, roast on a rack, stir-fry, or microwave it. Other fat reducing suggestions include:  Remove the skin from poultry.  Remove all visible fat from meats.  Skim the fat off stews, soups, and gravies before serving them.  Steam vegetables in water or broth instead of sauting them in  fat.  Avoid foods with trans fat (or hydrogenated oils), such as commercially fried foods and commercially baked goods. Commercial shortening and deep-frying fats will contain trans fat.  Increase intake of fruits, vegetables, whole grains, and legumes to replace foods high in fat.  Increase consumption of nuts, legumes, and seeds to at least 4 servings weekly. One serving of a legume equals  cup, and 1 serving of nuts or seeds equals  cup.  Choose whole grains more often. Have 3 servings per day (a serving is 1 ounce [oz]).  Eat 4 to 5 servings of vegetables per day. A serving of vegetables is 1 cup of raw leafy vegetables;  cup of raw or cooked cut-up vegetables;  cup of vegetable juice.  Eat 4 to 5 servings of fruit per day. A serving of fruit is 1 medium whole fruit;  cup of dried fruit;  cup of fresh, frozen, or canned fruit;  cup of 100% fruit juice.  Increase your intake of dietary fiber to 20 to 30 grams per day. Insoluble fiber may help lower your risk of heart disease and may help curb your appetite.  Soluble fiber binds cholesterol to be removed from the blood. Foods high in soluble fiber are dried beans, citrus fruits, oats, apples, bananas, broccoli, Brussels sprouts, and eggplant.  Try to include foods fortified with plant sterols or stanols, such as yogurt, breads, juices, or margarines. Choose several fortified foods to achieve a daily intake of 2 to 3 grams of plant sterols or stanols.  Foods with omega-3 fats can help reduce your risk of heart disease. Aim to have a 3.5 oz portion of fatty fish twice per week, such as salmon, mackerel, albacore tuna, sardines, lake trout, or herring. If you wish to take a fish oil supplement, choose one that contains 1 gram of both DHA and EPA.  Limit processed meats to 2 servings (3 oz portion) weekly.  Limit the sodium in your diet to 1500 milligrams (mg) per day. If you have high blood pressure, talk to a registered dietitian about  a DASH (Dietary Approaches to Stop Hypertension) eating plan.  Limit sweets and beverages with added sugar, such as soda, to no more than 5 servings per week. One serving is:   1 tablespoon sugar.  1 tablespoon jelly or jam.   cup sorbet.  1 cup lemonade.   cup regular soda. CHOOSING FOODS Starches  Allowed: Breads: All kinds (wheat, rye, raisin, white, oatmeal, [place], [place], and English muffin bread). Low-fat rolls: English muffins, frankfurter and hamburger buns, bagels, pita bread, tortillas (not fried). Pancakes, waffles, biscuits, and muffins made with recommended oil.  Avoid: Products made with saturated or trans fats, oils, or whole milk products. Butter rolls, cheese breads, croissants. Commercial doughnuts, muffins, sweet rolls, biscuits, waffles, pancakes, store-bought mixes. Crackers  Allowed: Low-fat crackers and snacks: Animal, graham, rye, saltine (with recommended oil, no lard), oyster, and matzo crackers. Bread sticks, melba toast, rusks, flatbread, pretzels, and light popcorn.  Avoid: High-fat crackers: cheese crackers, butter crackers, and those made with coconut, palm oil, or trans fat (hydrogenated oils). Buttered popcorn. Cereals  Allowed: Hot or cold whole-grain cereals.  Avoid: Cereals containing coconut, hydrogenated vegetable fat, or animal fat. Potatoes / Pasta / Rice  Allowed: All kinds of potatoes, rice, and pasta (such as macaroni, spaghetti, and noodles).  Avoid: Pasta or rice prepared with cream sauce or high-fat cheese. Chow mein noodles, [place] fries. Vegetables  Allowed: All vegetables and vegetable juices.  Avoid: Fried vegetables. Vegetables in cream, butter, or high-fat cheese sauces. Limit coconut. Fruit in cream or custard. Protein  Allowed: Limit your intake of meat, seafood, and poultry to no more than 6 oz (cooked weight) per day. All lean, well-trimmed beef, veal, pork, and lamb. All chicken and [place] without skin. All fish  and shellfish. Wild game: wild duck, rabbit, pheasant, and venison. Egg whites or low-cholesterol egg substitutes may be used as desired. Meatless dishes: recipes with dried beans, peas, lentils, and tofu (soybean curd). Seeds and nuts: all seeds and most nuts.  Avoid: Prime grade and other heavily marbled and fatty meats, such as short ribs, spare ribs, rib eye roast or steak, frankfurters, sausage, bacon, and high-fat luncheon meats, mutton. Caviar. Commercially fried fish. Domestic duck, goose, venison sausage. Organ meats: liver, gizzard, heart, chitterlings, brains, kidney, sweetbreads. Dairy  Allowed: Low-fat cheeses: nonfat or low-fat cottage cheese (1% or 2% fat), cheeses made with part skim milk, such as mozzarella, farmers, string, or ricotta. (Cheeses should be labeled no more than 2 to 6 grams fat per oz.). Skim (or 1%) milk: liquid, powdered, or evaporated. Buttermilk made with low-fat milk. Drinks made with skim or low-fat milk or cocoa. Chocolate milk or cocoa made with skim or low-fat (1%) milk. Nonfat or low-fat yogurt.  Avoid: Whole milk cheeses, including colby, cheddar, [place], Monterey Jack, [place], [place], [place], American, Swiss, and blue. Creamed cottage cheese, cream cheese. Whole milk and whole milk products, including buttermilk or yogurt made from whole milk, drinks made from whole milk. Condensed milk, evaporated whole milk, and 2% milk. Soups and Combination Foods  Allowed: Low-fat low-sodium soups: broth, dehydrated soups, homemade broth, soups with the fat removed, homemade cream soups made with skim or low-fat milk. Low-fat spaghetti, lasagna, chili, and Spanish rice if low-fat ingredients and low-fat cooking techniques are used.  Avoid: Cream soups made with whole milk, cream, or high-fat cheese. All other soups. Desserts and Sweets  Allowed: Sherbet, fruit ices, gelatins, meringues, and angel food cake. Homemade desserts with recommended fats, oils, and milk  products. Jam, jelly, honey, marmalade, sugars, and syrups. Pure sugar candy, such as gum drops, hard candy, jelly beans, marshmallows, mints, and small amounts of dark chocolate.  Avoid: Commercially prepared cakes, pies, cookies, frosting, pudding, or mixes for these products. Desserts containing whole milk products, chocolate, coconut, lard, palm oil, or palm kernel oil. Ice cream or ice cream drinks. Candy that contains chocolate, coconut, butter, hydrogenated fat, or unknown ingredients. Buttered syrups. Fats and Oils  Allowed: Vegetable oils: safflower, sunflower, corn, soybean, cottonseed, sesame, canola, olive, or peanut. Non-hydrogenated margarines. Salad dressing or mayonnaise: homemade or commercial, made with a recommended oil. Low or nonfat salad dressing or mayonnaise.  Limit added fats and oils to 6 to 8 tsp per day (includes fats used in cooking, baking, salads, and spreads on bread). Remember to count the "hidden fats" in foods.  Avoid: Solid fats and shortenings: butter, lard, salt pork, bacon drippings. Gravy containing meat  fat, shortening, or suet. Cocoa butter, coconut. Coconut oil, palm oil, palm kernel oil, or hydrogenated oils: these ingredients are often used in bakery products, nondairy creamers, whipped toppings, candy, and commercially fried foods. Read labels carefully. Salad dressings made of unknown oils, sour cream, or cheese, such as blue cheese and Roquefort. Cream, all kinds: half-and-half, light, heavy, or whipping. Sour cream or cream cheese (even if "light" or low-fat). Nondairy cream substitutes: coffee creamers and sour cream substitutes made with palm, palm kernel, hydrogenated oils, or coconut oil. Beverages  Allowed: Coffee (regular or decaffeinated), tea. Diet carbonated beverages, mineral water. Alcohol: Check with your caregiver. Moderation is recommended.  Avoid: Whole milk, regular sodas, and juice drinks with added sugar. Condiments  Allowed: All  seasonings and condiments. Cocoa powder. "Cream" sauces made with recommended ingredients.  Avoid: Carob powder made with hydrogenated fats. SAMPLE MENU Breakfast   cup orange juice   cup oatmeal  1 slice toast  1 tsp margarine  1 cup skim milk Lunch  [place] sandwich with 2 oz [place], 2 slices bread  Lettuce and tomato slices  Fresh fruit  Carrot sticks  Coffee or tea Snack  Fresh fruit or low-fat crackers Dinner  3 oz lean ground beef  1 baked potato  1 tsp margarine   cup asparagus  Lettuce salad  1 tbs non-creamy dressing   cup peach slices  1 cup skim milk Document Released: 12/23/2007 Document Revised: 09/14/2011 Document Reviewed: 06/08/2011 [employer] Patient Information 2014 [place], [place].   STROKE/TIA DISCHARGE INSTRUCTIONS SMOKING Cigarette smoking nearly doubles your risk of having a stroke & is the single most alterable risk factor  If you smoke or have smoked in the last 12 months, you are advised to quit smoking for your health.  Most of the excess cardiovascular risk related to smoking disappears within a year of stopping.  Ask you doctor about anti-smoking medications  [place] Quit Line: 1-800-QUIT NOW  Free Smoking Cessation Classes [phone]  CHOLESTEROL Know your levels; limit fat & cholesterol in your diet  Lipid Panel     Component Value Date/Time   CHOL 202* 04/16/2013 0415   TRIG 79 04/16/2013 0415   HDL 69 04/16/2013 0415   CHOLHDL 2.9 04/16/2013 0415   VLDL 16 04/16/2013 0415   LDLCALC 117* 04/16/2013 0415      Many patients benefit from treatment even if their cholesterol is at goal.  Goal: Total Cholesterol (CHOL) less than 160  Goal:  Triglycerides (TRIG) less than 150  Goal:  HDL greater than 40  Goal:  LDL (LDLCALC) less than 100   BLOOD PRESSURE American Stroke Association blood pressure target is less that 120/80 mm/Hg  Your discharge blood pressure is:  BP: 107/53 mmHg  Monitor your blood  pressure  Limit your salt and alcohol intake  Many individuals will require more than one medication for high blood pressure  DIABETES (A1c is a blood sugar average for last 3 months) Goal HGBA1c is under 7% (HBGA1c is blood sugar average for last 3 months)  Diabetes: No known diagnosis of diabetes    Lab Results  Component Value Date   HGBA1C 6.0* 04/15/2013     Your HGBA1c can be lowered with medications, healthy diet, and exercise.  Check your blood sugar as directed by your physician  Call your physician if you experience unexplained or low blood sugars.  PHYSICAL ACTIVITY/REHABILITATION Goal is 30 minutes at least 4 days per week  Activity: No restrictions. Therapies:none Return to work:  Activity decreases your risk of heart attack and stroke and makes your heart stronger.  It helps control your weight and blood pressure; helps you relax and can improve your mood.  Participate in a regular exercise program.  Talk with your doctor about the best form of exercise for you (dancing, walking, swimming, cycling).  DIET/WEIGHT Goal is to maintain a healthy weight  Your discharge diet is: Cardiac regular liquids Your height is:  Height: 5\' 6"  (167.6 cm) Your current weight is: Weight: 60.328 kg (133 lb) Your Body Mass Index (BMI) is:  BMI (Calculated): 21.5  Following the type of diet specifically designed for you will help prevent another stroke.  Your goal weight range is:  118-149  Your goal Body Mass Index (BMI) is 19-24.  Healthy food habits can help reduce 3 risk factors for stroke:  High cholesterol, hypertension, and excess weight.  RESOURCES Stroke/Support Group:  Call [phone]   STROKE EDUCATION PROVIDED/REVIEWED AND GIVEN TO PATIENT Stroke warning signs and symptoms How to activate emergency medical system (call 911). Medications prescribed at discharge. Need for follow-up after discharge. Personal risk factors for stroke. Pneumonia vaccine given: No Flu  vaccine given: No My questions have been answered, the writing is legible, and I understand these instructions.  I will adhere to these goals & educational materials that have been provided to me after my discharge from the hospital.

## 2013-04-16 NOTE — Discharge Summary (Signed)
Physician Discharge Summary  Brianna Harper ZDG:[id number] DOB: May 04, 1945 DOA: 04/15/2013  PCP: Lottie Dawson, MD  Admit date: 04/15/2013 Discharge date: 04/16/2013  Time spent: 35 minutes  Recommendations for Outpatient Follow-up:  1. Need repeat Lipid panel.  2. Follow result of EEG.  3. Reassess BP medications.   Discharge Diagnoses:   TIA (transient ischemic attack) vs Transient Global Amnesia.    HYPERLIPIDEMIA   GERD   Hypertension   Amnesia      Discharge Condition: stable.   Diet recommendation: Heart Healthy  Filed Weights   04/15/13 1242 04/15/13 2000  Weight: 58.968 kg (130 lb) 60.328 kg (133 lb)    History of present illness:  Brianna Harper is a 68 y.o. female with PMH significant for Migraine, Depression who was brought to ED by husband due to confusion and memory problems that started morning of admission. Patient didn't recall events from this morning per husband. She didn't recall that she spoke with her son over phone for over an hour. She has started to remember events from this morning.  She denies headaches, weakness, fever, cough, chest pain.    Hospital Course:  1-TIA vs transient Global Amnesia: MRI/ MRA negative. Doppler no significant stenosis. HBA1 at 6.0. EEG pending. LDL at 117. EEG pending. If ECHO negative will plan to discharge patient.  2-HTN: continue with verapamil. BP in the 100 range hold Maxzide for now.   Procedures: Carotid doppler: Findings suggest 1-39% internal carotid artery stenosis bilaterally. Vertebral arteries are patent with antegrade flow. ECHO: pending.   Consultations:  Dr Doy Mince.   Discharge Exam: Filed Vitals:   04/16/13 1200  BP: 107/53  Pulse: 71  Temp: 97.7 F (36.5 C)  Resp: 17    General: No distress.  Cardiovascular: S 1, S 2 RRR Respiratory: CTA Neuro exam ; non focal.   Discharge Instructions   Future Appointments Provider Department Dept Phone   05/04/2013 8:45 AM Burnis Medin,  MD [facility] [phone]   05/15/2013 1:30 PM Gatha Mayer, MD [facility] [phone]       Medication List    ASK your doctor about these medications       clonazePAM 1 MG tablet  Commonly known as:  KLONOPIN  Take 1 mg by mouth See admin instructions. 1 mg qhs regularly and tid prn neck pain/anxiety     loratadine 10 MG tablet  Commonly known as:  CLARITIN  Take 10 mg by mouth daily as needed for allergies or rhinitis.     omeprazole 40 MG capsule  Commonly known as:  PRILOSEC  Take 40 mg by mouth daily with breakfast.     omeprazole-sodium bicarbonate 40-1100 MG per capsule  Commonly known as:  ZEGERID  Take 1 capsule by mouth at bedtime.     rizatriptan 10 MG tablet  Commonly known as:  MAXALT  Take 10 mg by mouth as needed. May repeat in 2 hours if needed     triamterene-hydrochlorothiazide 37.5-25 MG per tablet  Commonly known as:  MAXZIDE-25  Take 0.5 tablets by mouth daily.     trimethoprim 100 MG tablet  Commonly known as:  TRIMPEX  Take 100 mg by mouth as needed (uti prevention). Take 1 po as directed for uti prevention     verapamil 180 MG CR tablet  Commonly known as:  CALAN-SR  Take 180 mg by mouth at bedtime.       Allergies  Allergen Reactions  . Nitrofurantoin  Hives and Swelling  . Erythromycin Ethylsuccinate     REACTION: GI upset  . Sulfadiazine     REACTION: rash      The results of significant diagnostics from this hospitalization (including imaging, microbiology, ancillary and laboratory) are listed below for reference.    Significant Diagnostic Studies: Ct Head Wo Contrast  04/15/2013   CLINICAL DATA:  Playing of memory loss, rule out stroke  EXAM: CT HEAD WITHOUT CONTRAST  TECHNIQUE: Contiguous axial images were obtained from the base of the skull through the vertex without intravenous contrast.  COMPARISON:  03/15/2013  FINDINGS: Scattered ethmoid air cell opacification bilaterally.  Mild sphenoid and maxillary sinus inflammatory change as well. Calvarium intact. No hemorrhage, infarct, mass, or hydrocephalus. No evidence of vascular territory infarct. No hydrocephalus.  IMPRESSION: Sinusitis, otherwise negative   Electronically Signed   By: Skipper Cliche M.D.   On: 04/15/2013 15:30   Mr Jodene Nam Head Wo Contrast  04/15/2013   CLINICAL DATA:  Transient memory loss this morning.  EXAM: MRI HEAD WITHOUT CONTRAST  MRA HEAD WITHOUT CONTRAST  TECHNIQUE: Multiplanar, multiecho pulse sequences of the brain and surrounding structures were obtained without intravenous contrast. Angiographic images of the head were obtained using MRA technique without contrast.  COMPARISON:  CT head from the same day. Report of MRI brain and MRA head 02/10/2001  FINDINGS: MRI HEAD FINDINGS  The diffusion-weighted images demonstrate no evidence for acute or subacute infarction. No hemorrhage or mass lesion is present. The ventricles are of normal size. Midline structures are normal. No significant extra-axial fluid collection is present.  Flow is present in the major intracranial arteries. The globes and orbits are intact. Mild mucosal disease is present in the maxillary and ethmoid sinuses bilaterally. There is mild mucosal thickening within hypoplastic frontal sinuses. Minimal mucosal thickening is present in the sphenoid sinuses bilaterally. The mastoid air cells are clear.  MRA HEAD FINDINGS  The internal carotid arteries are within normal limits from the high cervical segments through the ICA termini bilaterally. Slight prominence of the ophthalmic arteries are compatible with infundibulum of bilaterally. The A1 and M1 segments are normal. The anterior communicating artery is patent. ACA and MCA branch vessels are within normal limits. There is no evidence for aneurysm at either MCA bifurcation.  The vertebral arteries are codominant. The PICA origins are visualized and normal bilaterally. The basilar artery is within  normal limits. Both posterior cerebral arteries originate from the basilar tip. The PCA branch vessels are unremarkable.  IMPRESSION: 1. Normal MRI appearance of brain. 2. Mild diffuse sinus disease. 3. Normal variant MRA circle of Willis without evidence for significant proximal stenosis, aneurysm, or branch vessel occlusion.   Electronically Signed   By: Lawrence Santiago M.D.   On: 04/15/2013 20:04   Mr Brain Wo Contrast  04/15/2013   CLINICAL DATA:  Transient memory loss this morning.  EXAM: MRI HEAD WITHOUT CONTRAST  MRA HEAD WITHOUT CONTRAST  TECHNIQUE: Multiplanar, multiecho pulse sequences of the brain and surrounding structures were obtained without intravenous contrast. Angiographic images of the head were obtained using MRA technique without contrast.  COMPARISON:  CT head from the same day. Report of MRI brain and MRA head 02/10/2001  FINDINGS: MRI HEAD FINDINGS  The diffusion-weighted images demonstrate no evidence for acute or subacute infarction. No hemorrhage or mass lesion is present. The ventricles are of normal size. Midline structures are normal. No significant extra-axial fluid collection is present.  Flow is present in the major intracranial arteries.  The globes and orbits are intact. Mild mucosal disease is present in the maxillary and ethmoid sinuses bilaterally. There is mild mucosal thickening within hypoplastic frontal sinuses. Minimal mucosal thickening is present in the sphenoid sinuses bilaterally. The mastoid air cells are clear.  MRA HEAD FINDINGS  The internal carotid arteries are within normal limits from the high cervical segments through the ICA termini bilaterally. Slight prominence of the ophthalmic arteries are compatible with infundibulum of bilaterally. The A1 and M1 segments are normal. The anterior communicating artery is patent. ACA and MCA branch vessels are within normal limits. There is no evidence for aneurysm at either MCA bifurcation.  The vertebral arteries are  codominant. The PICA origins are visualized and normal bilaterally. The basilar artery is within normal limits. Both posterior cerebral arteries originate from the basilar tip. The PCA branch vessels are unremarkable.  IMPRESSION: 1. Normal MRI appearance of brain. 2. Mild diffuse sinus disease. 3. Normal variant MRA circle of Willis without evidence for significant proximal stenosis, aneurysm, or branch vessel occlusion.   Electronically Signed   By: Lawrence Santiago M.D.   On: 04/15/2013 20:04    Microbiology: No results found for this or any previous visit (from the past 240 hour(s)).   Labs: Basic Metabolic Panel:  Recent Labs Lab 04/15/13 1250  NA 142  K 3.8  CL 102  CO2 25  GLUCOSE 123*  BUN 18  CREATININE 0.81  CALCIUM 9.3   Liver Function Tests:  Recent Labs Lab 04/15/13 1250  AST 14  ALT 11  ALKPHOS 55  BILITOT 0.6  PROT 7.0  ALBUMIN 4.2   No results found for this basename: LIPASE, AMYLASE,  in the last 168 hours No results found for this basename: AMMONIA,  in the last 168 hours CBC:  Recent Labs Lab 04/15/13 1250  WBC 5.7  HGB 14.0  HCT 40.6  MCV 91.0  PLT 208   Cardiac Enzymes: No results found for this basename: CKTOTAL, CKMB, CKMBINDEX, TROPONINI,  in the last 168 hours BNP: BNP (last 3 results) No results found for this basename: PROBNP,  in the last 8760 hours CBG:  Recent Labs Lab 04/15/13 1301  GLUCAP 101*       Signed:  REGALADO,BELKYS  [facility] 04/16/2013, 1:40 PM

## 2013-04-16 NOTE — Progress Notes (Signed)
Progress Note: Patient and husband contacted at home after discharge.  EEG shows left temporal sharp transients that are frequent during the tracing.  Concern is for seizure despite the fact that this is the first event.  Questions addressed from patient and husband.    Recommendations: 1.  Patient to start Keppra 500mg  BID.  Prescription called in to [facility] on [address].   2.  Patient to follow up at [facility]-supplied with telephone number in 4-6 weeks 3.  Patient unable to drive, operate heavy machinery, perform activities at heights and participate in water activities until release by outpatient physician.  Alexis Goodell, MD [facility] [phone] 04/16/2013  5:01 PM

## 2013-04-16 NOTE — Progress Notes (Signed)
*  PRELIMINARY RESULTS* Vascular Ultrasound Carotid Duplex (Doppler) has been completed.   Findings suggest 1-39% internal carotid artery stenosis bilaterally. Vertebral arteries are patent with antegrade flow.  04/16/2013  Sharion Dove, [place]

## 2013-04-16 NOTE — Telephone Encounter (Signed)
Patient in ER for rule out CVA.  See notes.

## 2013-04-16 NOTE — Progress Notes (Signed)
  Echocardiogram 2D Echocardiogram has been performed.  Brianna Harper FRANCES 04/16/2013, 12:06 PM

## 2013-04-17 ENCOUNTER — Telehealth: Payer: Self-pay | Admitting: Neurology

## 2013-04-17 ENCOUNTER — Telehealth: Payer: Self-pay | Admitting: Internal Medicine

## 2013-04-17 NOTE — Telephone Encounter (Signed)
Patient is former Dr. Erling Cruz patient, was in the hospital last night for what they thought was mini stroke or seizure, and she had an abnormal EEG. Patient states Dr. Doy Mince instructed her to call and make an appointment as soon as possible with a neurologist and her friend Dr. Tamala Julian, an anesthesiologist at [facility] suggested Dr. Jannifer Franklin. Patient has not been seen in several years but has hospital referral. Please call to schedule and assign to new physician.

## 2013-04-17 NOTE — Telephone Encounter (Signed)
Message left that I called and will call back to home # for f/u

## 2013-04-17 NOTE — Telephone Encounter (Signed)
I called the patient to followup after I was notified she was hospitalized with amnesia and possible TIA. This is in the setting of a relatively recent prescription for clonazepam which helped her tremendously with regards to her sleep in her neck pain and pressure. Initially she had told me that that was making her a little bit sleepy and slow so I recommended she reduce to 0.5 mg taking a half tablet but she says she never did that. 2 days ago she had a sudden onset of amnesia-like symptoms and change in mental status without other focal findings. She was briefly hospitalized for neurologic workup including MRI studies of the brain didn't reveal anything though an EEG is suggested a possible seizure disorder and she has been started on Keppra.  We reviewed the situation and the plan is for her to get neurology followup as has been recommended and she is working on that.  She will stop her nighttime Zegerid as I had previously recommended she taper off of that. She will continue her clonazepam as neurology in the hospital said that is okay, though she will reduce at 0.5 mg because the It does make her somewhat sleepy as well as the Keppra. She will continue omeprazole 40 mg daily. As long as she is doing well with respect to the neck and throat symptoms she may cancel her February followup and she may communicate with me through my chart were the phone.

## 2013-04-25 ENCOUNTER — Ambulatory Visit (INDEPENDENT_AMBULATORY_CARE_PROVIDER_SITE_OTHER): Payer: Medicare Other | Admitting: Neurology

## 2013-04-25 ENCOUNTER — Encounter: Payer: Self-pay | Admitting: Neurology

## 2013-04-25 VITALS — BP 159/86 | HR 83 | Ht 66.0 in | Wt 140.0 lb

## 2013-04-25 DIAGNOSIS — G43909 Migraine, unspecified, not intractable, without status migrainosus: Secondary | ICD-10-CM | POA: Diagnosis not present

## 2013-04-25 DIAGNOSIS — G454 Transient global amnesia: Secondary | ICD-10-CM | POA: Diagnosis not present

## 2013-04-25 NOTE — Progress Notes (Signed)
Reason for visit: Transient global amnesia  Brianna Harper is a 68 y.o. female  History of present illness:  Brianna Harper is a 68 year old right-handed white female with a history of migraine headaches, but the patient indicates that she has not had any difficulty with her migraine since she went through menopause at age 74. The patient has not had a headache in several years. The patient went into the hospital on 04/15/2013 with an event of transient global amnesia. The patient had onset of the problem around 11:10 AM on that day. The patient was at home with her husband. The patient indicated that she did not feel well, and that she cannot remember anything. The patient had an event lasting about 30 minutes, and in the event cleared completely. The patient recalls going into the hospital through EMS, and she remembers everything within the hospitalization itself. The patient underwent MRI evaluation of the brain, carotid Doppler study, and 2-D echocardiogram evaluation that were unremarkable. The patient had an EEG study that revealed left temporal sharp wave activity. For this reason, the patient was placed on Keppra at 500 mg twice daily. The patient has not had any further events. The patient denies any problems with numbness, weakness, balance problems, visual changes, or speech changes. The patient did not get a headache before, during, or after the event. The patient was able to function normally, and she had a normal sensorium during the amnestic period. The patient comes to this office for further evaluation. The patient has never had events such as this previously.  Past Medical History  Diagnosis Date  . Anxiety   . Hyperlipidemia   . GERD (gastroesophageal reflux disease)   . Osteopenia     dexa -2.0 hip 2008  . Rapid heart rate     hx of   . Laryngitis   . Hypertension   . Migraine     "not very often anymore; they were more menopausal" (04/16/2013)  . Depression     Past  Surgical History  Procedure Laterality Date  . Colonoscopy  02/03/2004    internal hemorrhoids  . Upper gastrointestinal endoscopy  08/29/2008    gastritis, hiatal hernia    Family History  Problem Relation Age of Onset  . Hypertension Mother   . Migraines Mother   . Arthritis Father   . Ovarian cancer Maternal Grandmother   . Seizures Neg Hx     Social history:  reports that she has never smoked. She has never used smokeless tobacco. She reports that she does not drink alcohol or use illicit drugs.  Medications:  Current Outpatient Prescriptions on File Prior to Visit  Medication Sig Dispense Refill  . clonazePAM (KLONOPIN) 1 MG tablet Take 1 mg by mouth See admin instructions. 1/2 mg qhs regularly      . loratadine (CLARITIN) 10 MG tablet Take 10 mg by mouth daily as needed for allergies or rhinitis.      Marland Kitchen omeprazole (PRILOSEC) 40 MG capsule Take 40 mg by mouth daily with breakfast.       . rizatriptan (MAXALT) 10 MG tablet Take 10 mg by mouth as needed. May repeat in 2 hours if needed       . trimethoprim (TRIMPEX) 100 MG tablet Take 100 mg by mouth as needed (uti prevention). Take 1 po as directed for uti prevention      . verapamil (CALAN-SR) 180 MG CR tablet Take 180 mg by mouth at bedtime.  No current facility-administered medications on file prior to visit.      Allergies  Allergen Reactions  . Nitrofurantoin Hives and Swelling  . Erythromycin Ethylsuccinate     REACTION: GI upset  . Sulfadiazine     REACTION: rash    ROS:  Out of a complete 14 system review of symptoms, the patient complains only of the following symptoms, and all other reviewed systems are negative.  Transient global amnesia Headache Possible seizures Depression, anxiety   Blood pressure 159/86, pulse 83, height 5\' 6"  (1.676 m), weight 140 lb (63.504 kg).  Physical Exam  General: The patient is alert and cooperative at the time of the examination.  Eyes: Pupils are equal, round,  and reactive to light. Discs are flat bilaterally.  Neck: The neck is supple, no carotid bruits are noted.  Respiratory: The respiratory examination is clear.  Cardiovascular: The cardiovascular examination reveals a regular rate and rhythm, no obvious murmurs or rubs are noted.  Skin: Extremities are without significant edema.  Neurologic Exam  Mental status: The patient is alert and oriented x 3 at the time of the examination. The patient has apparent normal recent and remote memory, with an apparently normal attention span and concentration ability.  Cranial nerves: Facial symmetry is present. There is good sensation of the face to pinprick and soft touch bilaterally. The strength of the facial muscles and the muscles to head turning and shoulder shrug are normal bilaterally. Speech is well enunciated, no aphasia or dysarthria is noted. Extraocular movements are full. Visual fields are full. The tongue is midline, and the patient has symmetric elevation of the soft palate. No obvious hearing deficits are noted.  Motor: The motor testing reveals 5 over 5 strength of all 4 extremities. Good symmetric motor tone is noted throughout.  Sensory: Sensory testing is intact to pinprick, soft touch, vibration sensation, and position sense on all 4 extremities. No evidence of extinction is noted.  Coordination: Cerebellar testing reveals good finger-nose-finger and heel-to-shin bilaterally.  Gait and station: Gait is normal. Tandem gait is normal. Romberg is negative. No drift is seen.  Reflexes: Deep tendon reflexes are symmetric and normal bilaterally. Toes are downgoing bilaterally.   MRI  brain 04/15/2013:  IMPRESSION: 1. Normal MRI appearance of brain. 2. Mild diffuse sinus disease. 3. Normal variant MRA circle of [name] without evidence for significant proximal stenosis, aneurysm, or branch vessel occlusion.    EEG report 04/15/2013:  IMPRESSION:  This is an abnormal  electroencephalogram secondary to left temporal sharp transients.     Assessment/Plan:   1. Transient global amnesia, possible seizure  2. History of migraine  The patient had an event of transient global amnesia lasting 30 minutes with full clearing. The patient has had an abnormal EEG study with left temporal sharp wave activity. The event could have represented a seizure. The patient has been placed on low-dose aspirin, and I have recommended that she continue this. Migraine headache may also cause transient global amnesia, but the patient has not had issues with migraine for some time. The patient is motivated to come off of the [place], and I think that this is reasonable at this point. The patient will reduce the Keppra to 250 mg twice daily for 2 weeks, and then stop the medication. If the episode recurs, the patient will go back on Keppra and remain on the medication. The patient will contact our office if this occurs. The patient will followup otherwise if needed.  Jill Alexanders  MD 04/25/2013 9:54 PM  [facility] [address] [place], [place] [address]  Phone [phone] Fax [phone]

## 2013-04-25 NOTE — Patient Instructions (Signed)
Transient Global Amnesia Your exam shows you may have a rare problem that causes temporary amnesia, an inability to remember what has happened in the past several hours or day. Transient global amnesia (TGA) means you cannot remember recent events, even though you may look and act normally. There are no physical problems in TGA; your vision, strength, coordination, and sensations are all normal. TGA occurs most often in older patients, and in patients with high blood pressure. The exact cause of TGA is not known, although it is thought to be due to vascular disease in your brain. There is usually a complete return to normal memory capacity after an episode is over. About 20-30% of patients with TGA will have more than one episode, and some studies show a slight increased risk for stroke. Although no special treatment is needed, taking up to one adult aspirin daily reduces the risk of having a stroke. You should consider taking aspirin daily if you are not allergic to it. Medical evaluation may require specialized scans to check for stroke or other brain problems, an EEG (brain wave test), or blood tests. Avoid alcohol or any sedating medicines until you are completely recovered. Call your doctor right away if your memory is not fully recovered after 24 hours, or if you have any other serious problems including:  Severe headache, nausea, vomiting, fever, or other symptoms of an infection.  Weakness, numbness, difficulty with movement, or incoordination.  Blurred or double vision, unusual sleepiness, seizures, or fainting. Document Released: 04/22/2004 Document Revised: 06/07/2011 Document Reviewed: 03/15/2005 [facility] Patient Information 2014 [place].

## 2013-04-30 ENCOUNTER — Other Ambulatory Visit: Payer: Self-pay

## 2013-04-30 DIAGNOSIS — Z1231 Encounter for screening mammogram for malignant neoplasm of breast: Secondary | ICD-10-CM

## 2013-05-04 ENCOUNTER — Encounter: Payer: Self-pay | Admitting: Internal Medicine

## 2013-05-04 ENCOUNTER — Ambulatory Visit (INDEPENDENT_AMBULATORY_CARE_PROVIDER_SITE_OTHER): Payer: Medicare Other | Admitting: Internal Medicine

## 2013-05-04 VITALS — BP 150/78 | HR 80 | Temp 98.1°F | Ht 66.0 in | Wt 138.0 lb

## 2013-05-04 DIAGNOSIS — R7309 Other abnormal glucose: Secondary | ICD-10-CM | POA: Diagnosis not present

## 2013-05-04 DIAGNOSIS — H5200 Hypermetropia, unspecified eye: Secondary | ICD-10-CM | POA: Diagnosis not present

## 2013-05-04 DIAGNOSIS — H524 Presbyopia: Secondary | ICD-10-CM | POA: Diagnosis not present

## 2013-05-04 DIAGNOSIS — I10 Essential (primary) hypertension: Secondary | ICD-10-CM

## 2013-05-04 DIAGNOSIS — G454 Transient global amnesia: Secondary | ICD-10-CM

## 2013-05-04 DIAGNOSIS — H40019 Open angle with borderline findings, low risk, unspecified eye: Secondary | ICD-10-CM | POA: Diagnosis not present

## 2013-05-04 DIAGNOSIS — K219 Gastro-esophageal reflux disease without esophagitis: Secondary | ICD-10-CM | POA: Diagnosis not present

## 2013-05-04 DIAGNOSIS — H04129 Dry eye syndrome of unspecified lacrimal gland: Secondary | ICD-10-CM | POA: Diagnosis not present

## 2013-05-04 DIAGNOSIS — R739 Hyperglycemia, unspecified: Secondary | ICD-10-CM | POA: Insufficient documentation

## 2013-05-04 MED ORDER — VERAPAMIL HCL ER 240 MG PO TBCR: 240.0000 mg | EXTENDED_RELEASE_TABLET | Freq: Every day | ORAL | Status: DC

## 2013-05-04 MED ORDER — OMEPRAZOLE 40 MG PO CPDR: 40.0000 mg | DELAYED_RELEASE_CAPSULE | Freq: Every day | ORAL | Status: DC

## 2013-05-04 NOTE — Progress Notes (Signed)
Chief Complaint  Patient presents with  . Follow-up    Hospital.  Seen for TIA and TGA    HPI: Patient comes in today as follow up from hospitalization for TGA episode  Had abd eeg and placed on keppra  Recovered and then saw Dr Jannifer Franklin last week for a migraon and dec to wean the keppra.  ? Of seizure   cause of eeg but dr Jannifer Franklin dod not think it was a seizure.  BP: D/c readings  was 053 systolic.then elevated in 140 range at home  Since out has been higher numbers  ELR:  Soreness in  Lower neck. Was on omeprezole  Bid and then added  Clonopen. Helped not felt to have caused her tga  ROS: See pertinent positives and negatives per HPI.no cp sob somewhat down since decreasing med sad at times not hopeless.  Past Medical History  Diagnosis Date  . Anxiety   . Hyperlipidemia   . GERD (gastroesophageal reflux disease)   . Osteopenia     dexa -2.0 hip 2008  . Rapid heart rate     hx of   . Laryngitis   . Hypertension   . Migraine     "not very often anymore; they were more menopausal" (04/16/2013)  . Depression     Family History  Problem Relation Age of Onset  . Hypertension Mother   . Migraines Mother   . Arthritis Father   . Ovarian cancer Maternal Grandmother   . Seizures Neg Hx     History   Social History  . Marital Status: Married    Spouse Name: N/A    Number of Children: 2  . Years of Education: college BS   Occupational History  . retired    Social History Main Topics  . Smoking status: Never Smoker   . Smokeless tobacco: Never Used  . Alcohol Use: No  . Drug Use: No  . Sexual Activity: Yes   Other Topics Concern  . None   Social History Narrative   HHof 2    Non smoker   2 children  Also twins that did not survive birth   Married    CB x 2   No pets     Outpatient Encounter Prescriptions as of 05/04/2013  Medication Sig  . aspirin 81 MG tablet Take 81 mg by mouth daily.  . clonazePAM (KLONOPIN) 1 MG tablet 1 mg See admin instructions. 1/2 mg  qhs regularly  . levETIRAcetam (KEPPRA) 500 MG tablet Take 250 mg by mouth 2 (two) times daily.   Marland Kitchen loratadine (CLARITIN) 10 MG tablet Take 10 mg by mouth daily as needed for allergies or rhinitis.  Marland Kitchen omeprazole (PRILOSEC) 40 MG capsule Take 1 capsule (40 mg total) by mouth daily with breakfast.  . rizatriptan (MAXALT) 10 MG tablet Take 10 mg by mouth as needed. May repeat in 2 hours if needed   . trimethoprim (TRIMPEX) 100 MG tablet Take 100 mg by mouth as needed (uti prevention). Take 1 po as directed for uti prevention  . [DISCONTINUED] omeprazole (PRILOSEC) 40 MG capsule Take 40 mg by mouth daily with breakfast.   . [DISCONTINUED] verapamil (CALAN-SR) 180 MG CR tablet Take 180 mg by mouth at bedtime.  . verapamil (CALAN-SR) 240 MG CR tablet Take 1 tablet (240 mg total) by mouth at bedtime.    EXAM:  BP 150/78  Pulse 80  Temp(Src) 98.1 F (36.7 C) (Oral)  Ht 5\' 6"  (1.676 m)  Wt 138 lb (62.596 kg)  BMI 22.28 kg/m2  SpO2 98%  Body mass index is 22.28 kg/(m^2).  GENERAL: vitals reviewed and listed above, alert, oriented, appears well hydrated and in no acute distress HEENT: atraumatic, conjunctiva  clear, no obvious abnormalities on inspection of external nose and ears  NECK: no obvious masses on inspection palpation  LUNGS: clear to auscultation bilaterally, no wheezes, rales or rhonchi, good air movement CV: HRRR, no clubbing cyanosis or  peripheral edema nl cap refill  MS: moves all extremities without noticeable focal  abnormality PSYCH: pleasant and cooperative, no obvious depression some reaction to situation but appropriate   and insightful   Lab Results  Component Value Date   WBC 5.7 04/15/2013   HGB 14.0 04/15/2013   HCT 40.6 04/15/2013   PLT 208 04/15/2013   GLUCOSE 123* 04/15/2013   CHOL 202* 04/16/2013   TRIG 79 04/16/2013   HDL 69 04/16/2013   LDLDIRECT 109.8 05/01/2012   LDLCALC 117* 04/16/2013   ALT 11 04/15/2013   AST 14 04/15/2013   NA 142 04/15/2013   K 3.8  04/15/2013   CL 102 04/15/2013   CREATININE 0.81 04/15/2013   BUN 18 04/15/2013   CO2 25 04/15/2013   TSH 1.87 03/09/2013   HGBA1C 6.0* 04/15/2013     ASSESSMENT AND PLAN:  Discussed the following assessment and plan:  Hypertension - low in hosp creeping up now inc verap to 240 and monitor and fu.   Acid reflux - contineu will refill for now  but keep dr G in care   TGA (transient global amnesia) - abd eeg but not felt to be a seizure weaning med and follow  Hyperglycemia - lasgt a1c 6.0 lsi recheck inabout 6 months   -Patient advised to return or notify health care team  if symptoms worsen or persist or new concerns arise.  Patient Instructions  Increase  Verapamil  Er. to 240  Continue to monitor  BP reading. And send [place] in readings.   In 3-4 weeks  ROV in 2-3  months . Will flag dr Darnell Level about refill of omeprazole . Or will run out.  Have dr g continue the clonazepam  For now.   Avoid simple sugars and will fu with hg a1c in about 5-6 months    Kamaron Deskins K. Wrangler Penning M.D.  Pre visit review using our clinic review tool, if applicable. No additional management support is needed unless otherwise documented below in the visit note. Total visit 40 mins > 50% spent counseling and coordinating care

## 2013-05-04 NOTE — Patient Instructions (Addendum)
Increase  Verapamil  Er. to 240  Continue to monitor  BP reading. And send [place] in readings.   In 3-4 weeks  ROV in 2-3  months . Will flag dr Darnell Level about refill of omeprazole . Or will run out.  Have dr g continue the clonazepam  For now.   Avoid simple sugars and will fu with hg a1c in about 5-6 months

## 2013-05-07 ENCOUNTER — Telehealth: Payer: Self-pay | Admitting: Internal Medicine

## 2013-05-07 NOTE — Telephone Encounter (Signed)
Relevant patient education assigned to patient using Emmi. ° °

## 2013-05-15 ENCOUNTER — Ambulatory Visit: Payer: Medicare Other | Admitting: Internal Medicine

## 2013-05-28 ENCOUNTER — Telehealth: Payer: Self-pay | Admitting: Internal Medicine

## 2013-05-28 NOTE — Telephone Encounter (Signed)
Brianna Harper, see message and advise.

## 2013-05-28 NOTE — Telephone Encounter (Signed)
Patient Information:  Caller Name: Raney  Phone: [phone]  Patient: Brianna Harper  Gender: Female  DOB: 1945/04/05  Age: 68 Years  PCP: Shanon Ace (Family Practice)  Office Follow Up:  Does the office need to follow up with this patient?: Yes  Instructions For The Office: No appts. available. RN spoke with Irine Seal, in office, regarding emergent disposition. Irine Seal states she will discuss above with Dr. Regis Bill and return call to patient with care advice and instruction.  RN Note:  Patient states she developed pain in her right shoulder, onset X 2 weeks. Patient describes "dull pain" that is constant. States pain radiates down right arm to elbow. States pain is worse in the morning. States pain does not increase with movement.  Denies chest pain or shortness of breath. Patient states she has tried Aleve and heat without improvement. Denies numbness or tingling. Denies injury. Denies swelling or redness.  No appts. available. RN spoke with Irine Seal, in office, regarding emergent disposition. Irine Seal states she will discuss above with Dr. Regis Bill and return call to patient with care advice and instruction. Patient informed of above.  Symptoms  Reason For Call & Symptoms: Pain right shoulder  Reviewed Health History In EMR: Yes  Reviewed Medications In EMR: Yes  Reviewed Allergies In EMR: Yes  Reviewed Surgeries / Procedures: Yes  Date of Onset of Symptoms: 05/14/2013  Treatments Tried: Aleve, heat  Treatments Tried Worked: No  Guideline(s) Used:  Shoulder Pain  Disposition Per Guideline:   Go to ED Now  Reason For Disposition Reached:   Age > 57 and no obvious cause and pain even when not moving the arm (Exception: pain is clearly made worse by moving arm or bending neck)  Advice Given:  Call Back If  You become worse.  RN Overrode Recommendation:  Document Patient  No appts. available. RN spoke with Irine Seal, in office, regarding emergent disposition. Irine Seal  states she will discuss above with Dr. Regis Bill and return call to patient with care advice and instruction.

## 2013-05-28 NOTE — Telephone Encounter (Signed)
Pt coming in on 05/31/13 @ 10:45.

## 2013-05-31 ENCOUNTER — Ambulatory Visit: Payer: Medicare Other | Admitting: Internal Medicine

## 2013-06-05 ENCOUNTER — Ambulatory Visit (INDEPENDENT_AMBULATORY_CARE_PROVIDER_SITE_OTHER): Payer: Medicare Other | Admitting: Internal Medicine

## 2013-06-05 ENCOUNTER — Encounter: Payer: Self-pay | Admitting: Internal Medicine

## 2013-06-05 VITALS — BP 150/80 | Temp 98.5°F | Ht 66.0 in | Wt 139.0 lb

## 2013-06-05 DIAGNOSIS — M25519 Pain in unspecified shoulder: Secondary | ICD-10-CM

## 2013-06-05 DIAGNOSIS — M79601 Pain in right arm: Secondary | ICD-10-CM | POA: Insufficient documentation

## 2013-06-05 DIAGNOSIS — I10 Essential (primary) hypertension: Secondary | ICD-10-CM

## 2013-06-05 DIAGNOSIS — M25511 Pain in right shoulder: Secondary | ICD-10-CM | POA: Insufficient documentation

## 2013-06-05 DIAGNOSIS — M79609 Pain in unspecified limb: Secondary | ICD-10-CM

## 2013-06-05 DIAGNOSIS — K219 Gastro-esophageal reflux disease without esophagitis: Secondary | ICD-10-CM | POA: Diagnosis not present

## 2013-06-05 MED ORDER — MELOXICAM 7.5 MG PO TABS: 7.5000 mg | ORAL_TABLET | Freq: Every day | ORAL | Status: DC

## 2013-06-05 NOTE — Patient Instructions (Addendum)
This may also be a neck problem in addition to  Shoulder  Try  Antiinflammatory for 1-2 weeks  refer to ortho sports medicine in a meantime.   Continue bp monitoring  As you are doing  And keep follow up appt.

## 2013-06-05 NOTE — Progress Notes (Signed)
Chief Complaint  Patient presents with  . Shoulder Pain    Ongoing for 3 wks.  The pain extends down her arm to her elbow.  Has tried Aleve and Advil.    HPI: Patient comes in today for SDA for  new problem evaluation. sudden onset  3 weeks ago  In am  righ tshoulder pain and down to mid arm aching also near right neck  was some better .  And then back again makes her feel like want to lay down   Right shoulder down arm to elbow  ? Pain intensity   Hard to extend scapuyla area.  Tried aleve  advil  400  And  One aleve. ocass ? How much help avoiding cause of her GERD  Issue throat.   Bp readings  jp and down ( pain and shoulder) taking 240 verapamil  for a few weeks  Has had readings in 135- 155 in past week  ROS: See pertinent positives and negatives per HPI. No sob syncope  No bleeding no hx same no weakness or numbness  Past Medical History  Diagnosis Date  . Anxiety   . Hyperlipidemia   . GERD (gastroesophageal reflux disease)   . Osteopenia     dexa -2.0 hip 2008  . Rapid heart rate     hx of   . Laryngitis   . Hypertension   . Migraine     "not very often anymore; they were more menopausal" (04/16/2013)  . Depression     Family History  Problem Relation Age of Onset  . Hypertension Mother   . Migraines Mother   . Arthritis Father   . Ovarian cancer Maternal Grandmother   . Seizures Neg Hx     History   Social History  . Marital Status: Married    Spouse Name: N/A    Number of Children: 2  . Years of Education: college BS   Occupational History  . retired    Social History Main Topics  . Smoking status: Never Smoker   . Smokeless tobacco: Never Used  . Alcohol Use: No  . Drug Use: No  . Sexual Activity: Yes   Other Topics Concern  . None   Social History Narrative   HHof 2    Non smoker   2 children  Also twins that did not survive birth   Married    CB x 2   No pets     Outpatient Encounter Prescriptions as of 06/05/2013  Medication Sig    . aspirin 81 MG tablet Take 81 mg by mouth daily.  . Cholecalciferol (VITAMIN D3) 1000 UNITS CAPS Take 1 capsule by mouth daily.  . clonazePAM (KLONOPIN) 1 MG tablet 1/2 mg qhs regularly  . loratadine (CLARITIN) 10 MG tablet Take 10 mg by mouth daily as needed for allergies or rhinitis.  Marland Kitchen omeprazole (PRILOSEC) 40 MG capsule Take 1 capsule (40 mg total) by mouth daily with breakfast.  . rizatriptan (MAXALT) 10 MG tablet Take 10 mg by mouth as needed. May repeat in 2 hours if needed   . trimethoprim (TRIMPEX) 100 MG tablet Take 100 mg by mouth as needed (uti prevention). Take 1 po as directed for uti prevention  . verapamil (CALAN-SR) 240 MG CR tablet Take 1 tablet (240 mg total) by mouth at bedtime.  . meloxicam (MOBIC) 7.5 MG tablet Take 1-2 tablets (7.5-15 mg total) by mouth daily.  . [DISCONTINUED] levETIRAcetam (KEPPRA) 500 MG tablet Take 250 mg  by mouth 2 (two) times daily.     EXAM:  BP 150/80  Temp(Src) 98.5 F (36.9 C) (Oral)  Ht 5\' 6"  (1.676 m)  Wt 139 lb (63.05 kg)  BMI 22.45 kg/m2  Body mass index is 22.45 kg/(m^2).  GENERAL: vitals reviewed and listed above, alert, oriented, appears well hydrated and in no acute distress and comfortable with right shoulder scapula neck HEENT: atraumatic, conjunctiva  clear, no obvious abnormalities on inspection of external nose and ears NECK: no obvious masses on inspection palpation negative midline tenderness tight trapezius right no atrophy CV: HRRR, no clubbing cyanosis or  peripheral edema nl cap refill  MS: moves all extremities  says it hurts some when she raises her right shoulder and cross body usage of her right arm no atrophy no rash Neuro no obvious weakness upper sternum and knees DTRs are present PSYCH: pleasant and cooperative, Imaging studies done in January hospitalization for transient amnesia show some mild arthritis in his C-spine soft tissue CT of the neck no significant changes but no dedicated neck x-ray  imaging. ASSESSMENT AND PLAN:  Discussed the following assessment and plan:  Shoulder pain, right - ? if from neck r vs solely shoulder  cautious antiinflammaoty and referral opinion - Plan: Ambulatory referral to Sports Medicine  Arm pain, right - Related to above - Plan: Ambulatory referral to Sports Medicine  Hypertension - Continue monitor and fu   GERD Hold the asa while on  nsaid  -Patient advised to return or notify health care team  if symptoms worsen or persist or new concerns arise.  Patient Instructions  This may also be a neck problem in addition to  Shoulder  Try  Antiinflammatory for 1-2 weeks  refer to ortho sports medicine in a meantime.   Continue bp monitoring  As you are doing  And keep follow up appt.      Standley Brooking. Brianna Harper M.D.  Pre visit review using our clinic review tool, if applicable. No additional management support is needed unless otherwise documented below in the visit note.

## 2013-06-15 ENCOUNTER — Telehealth: Payer: Self-pay | Admitting: Internal Medicine

## 2013-06-15 NOTE — Telephone Encounter (Signed)
Take 1 twice a day for 3-5 days

## 2013-06-15 NOTE — Telephone Encounter (Addendum)
Pt has trimethoprim 100 mg for uti and she can not remember how many she can take per day,

## 2013-06-15 NOTE — Telephone Encounter (Signed)
Patient notified

## 2013-06-22 ENCOUNTER — Other Ambulatory Visit (INDEPENDENT_AMBULATORY_CARE_PROVIDER_SITE_OTHER): Payer: Medicare Other

## 2013-06-22 ENCOUNTER — Ambulatory Visit (INDEPENDENT_AMBULATORY_CARE_PROVIDER_SITE_OTHER): Payer: Medicare Other | Admitting: Family Medicine

## 2013-06-22 ENCOUNTER — Encounter: Payer: Self-pay | Admitting: Family Medicine

## 2013-06-22 VITALS — BP 142/84 | HR 85 | Wt 135.0 lb

## 2013-06-22 DIAGNOSIS — M75100 Unspecified rotator cuff tear or rupture of unspecified shoulder, not specified as traumatic: Secondary | ICD-10-CM | POA: Diagnosis not present

## 2013-06-22 DIAGNOSIS — M7550 Bursitis of unspecified shoulder: Secondary | ICD-10-CM | POA: Insufficient documentation

## 2013-06-22 DIAGNOSIS — IMO0002 Reserved for concepts with insufficient information to code with codable children: Secondary | ICD-10-CM | POA: Diagnosis not present

## 2013-06-22 DIAGNOSIS — M25511 Pain in right shoulder: Secondary | ICD-10-CM

## 2013-06-22 DIAGNOSIS — M25519 Pain in unspecified shoulder: Secondary | ICD-10-CM

## 2013-06-22 NOTE — Assessment & Plan Note (Signed)
I do believe the patient does have subacromial bursitis. Patient did respond fairly well to the injection but did not have complete resolution of pain. Differential and includes cervical degenerative disc disease and would need x-rays if patient continues to have pain. Patient though at this time we'll do a home exercise program for her shoulder, discussed over-the-counter medications in decreasing patient's meloxicam secondary to her gastroesophageal reflux disease, as well as an icing protocol. Patient will try these interventions and come back and see me again in 3 weeks for further evaluation.

## 2013-06-22 NOTE — Patient Instructions (Addendum)
Very nice to meet you Exercises 3-4 times a week.  Ice 20 minutes 2 times a day.  Turmeric 500mg  twice daily.  Vitamin D 2000 IU daily Come back in 3 weeks.

## 2013-06-22 NOTE — Progress Notes (Signed)
[facility] [place], [place] [address] Phone: [phone] Subjective:    I'm seeing this patient by the request  of:  Lottie Dawson, MD   CC: Right shoulder pain  CWC:[id number] Brianna Harper is a 68 y.o. female coming in with complaint of right shoulder pain. Patient states that it seems to start in the shoulder and radiating down her right arm. Patient is to remember any true injury. Patient states it can radiate all the way to her elbow but this doesn't or hand. Patient has tried over-the-counter anti-inflammatories with some mild improvement. Patient states it is also unfortunately affecting her gastroesophageal reflux disease. Patient states that the pain is worse with certain movements and better with rest. Patient the severity of 5/10. 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     Past medical history, social, surgical and family history all reviewed in electronic medical record.   Review of Systems: No headache, visual changes, nausea, vomiting, diarrhea, constipation, dizziness, abdominal pain, skin rash, fevers, chills, night sweats, weight loss, swollen lymph nodes, body aches, joint swelling, muscle aches, chest pain, shortness of breath, mood changes.   Objective Blood pressure 142/84, pulse 85, weight 135 lb (61.236 kg), SpO2 98.00%.  General: No apparent distress alert and oriented x3 mood and affect normal, dressed appropriately.  HEENT: Pupils equal, extraocular movements intact  Respiratory: Patient's speak in full sentences and does not appear short of breath  Cardiovascular: No lower extremity edema, non tender, no erythema  Skin: Warm dry intact with no signs of infection or rash on extremities or on axial skeleton.  Abdomen: Soft nontender  Neuro: Cranial nerves II through XII are intact, neurovascularly  intact in all extremities with 2+ DTRs and 2+ pulses.  Lymph: No lymphadenopathy of posterior or anterior cervical chain or axillae bilaterally.  Gait normal with good balance and coordination.  MSK:  Non tender with full range of motion and good stability and symmetric strength and tone of elbows, wrist, hip, knee and ankles bilaterally.  Neck: Inspection unremarkable mild increase in lordosis No palpable stepoffs. Negative Spurling's maneuver. Full neck range of motion Grip strength and sensation normal in bilateral hands Strength good C4 to T1 distribution No sensory change to C4 to T1 Negative Hoffman sign bilaterally Reflexes normal Shoulder: Right Inspection reveals no abnormalities, atrophy or asymmetry. Palpation is normal with no tenderness over AC joint or bicipital groove. ROM is full in all planes passively but does have pain in the extreme internal and external range of motion as well as forward flexion. Rotator cuff strength normal throughout. signs of impingement with a positive Neer and Hawkin's tests, empty can sign. Speeds and Yergason's tests normal. No labral pathology noted with negative Obrien's, negative clunk and good stability. Normal scapular function observed. No painful arc and no drop arm sign. No apprehension sign Contralateral shoulder unremarkable  MSK US performed of: Right shoulder This study was ordered, performed, and interpreted by Charlann Boxer D.O.  Shoulder:   Supraspinatus:  Appears normal on long and transverse views, no bursal bulge seen with shoulder abduction on impingement view. Infraspinatus:  Appears normal on long and transverse views. Subscapularis:  Appears normal on long and transverse views patient though does have a bursitis noted. Teres Minor:  Appears normal on long and transverse views. AC joint:  Capsule is  distended with mild osteophytic changes. Glenohumeral Joint:  Appears normal without effusion. Glenoid Labrum:  Intact  without visualized tears. Biceps Tendon:  Appears normal on long and transverse views, no fraying of tendon, tendon located in intertubercular groove, no subluxation with shoulder internal or external rotation. No increased power doppler signal.  Impression: Mild subacromial bursitis  Procedure: Real-time Ultrasound Guided Injection of right glenohumeral joint Device: GE Logiq E  Ultrasound guided injection is preferred based studies that show increased duration, increased effect, greater accuracy, decreased procedural pain, increased response rate with ultrasound guided versus blind injection.  Verbal informed consent obtained.  Time-out conducted.  Noted no overlying erythema, induration, or other signs of local infection.  Skin prepped in a sterile fashion.  Local anesthesia: Topical Ethyl chloride.  With sterile technique and under real time ultrasound guidance:  Joint visualized.  23g 1  inch needle inserted posterior approach. Pictures taken for needle placement. Patient did have injection of 2 cc of 1% lidocaine, 2 cc of 0.5% Marcaine, and 1.0 cc of Kenalog 40 mg/dL. Completed without difficulty  Pain immediately resolved suggesting accurate placement of the medication.  Advised to call if fevers/chills, erythema, induration, drainage, or persistent bleeding.  Images permanently stored and available for review in the ultrasound unit.  Impression: Technically successful ultrasound guided injection.     Impression and Recommendations:     This case required medical decision making of moderate complexity.

## 2013-07-18 ENCOUNTER — Ambulatory Visit: Payer: Medicare Other | Admitting: Family Medicine

## 2013-07-23 ENCOUNTER — Ambulatory Visit (INDEPENDENT_AMBULATORY_CARE_PROVIDER_SITE_OTHER): Payer: Medicare Other | Admitting: Internal Medicine

## 2013-07-23 ENCOUNTER — Encounter: Payer: Self-pay | Admitting: Internal Medicine

## 2013-07-23 VITALS — BP 130/82 | Temp 98.1°F | Ht 66.0 in | Wt 135.0 lb

## 2013-07-23 DIAGNOSIS — R7309 Other abnormal glucose: Secondary | ICD-10-CM | POA: Diagnosis not present

## 2013-07-23 DIAGNOSIS — M75100 Unspecified rotator cuff tear or rupture of unspecified shoulder, not specified as traumatic: Secondary | ICD-10-CM | POA: Diagnosis not present

## 2013-07-23 DIAGNOSIS — K219 Gastro-esophageal reflux disease without esophagitis: Secondary | ICD-10-CM | POA: Diagnosis not present

## 2013-07-23 DIAGNOSIS — IMO0002 Reserved for concepts with insufficient information to code with codable children: Secondary | ICD-10-CM | POA: Diagnosis not present

## 2013-07-23 DIAGNOSIS — R739 Hyperglycemia, unspecified: Secondary | ICD-10-CM

## 2013-07-23 DIAGNOSIS — M7550 Bursitis of unspecified shoulder: Secondary | ICD-10-CM

## 2013-07-23 DIAGNOSIS — I10 Essential (primary) hypertension: Secondary | ICD-10-CM | POA: Diagnosis not present

## 2013-07-23 MED ORDER — TRIMETHOPRIM 100 MG PO TABS: 100.0000 mg | ORAL_TABLET | ORAL | Status: DC | PRN

## 2013-07-23 MED ORDER — LISINOPRIL 10 MG PO TABS: 10.0000 mg | ORAL_TABLET | Freq: Every day | ORAL | Status: DC

## 2013-07-23 NOTE — Progress Notes (Signed)
Chief Complaint  Patient presents with  . Follow-up    HPI: Fu of number of issues :  Bp was 142/84  1 142/80  135/70   151/82  Take verapamil hs   Shoulder was a lot.Better after   Injection  And didn't go back cause  Had to change appt and just never followed up cause better   Doubled vit d   And off melxicam      Canceled follow up with dr Tamala Julian planned for last week   Gi ongoing  Taking prilosec  ROS: See pertinent positives and negatives per HPI.  Past Medical History  Diagnosis Date  . Anxiety   . Hyperlipidemia   . GERD (gastroesophageal reflux disease)   . Osteopenia     dexa -2.0 hip 2008  . Rapid heart rate     hx of   . Laryngitis   . Hypertension   . Migraine     "not very often anymore; they were more menopausal" (04/16/2013)  . Depression     Family History  Problem Relation Age of Onset  . Hypertension Mother   . Migraines Mother   . Arthritis Father   . Ovarian cancer Maternal Grandmother   . Seizures Neg Hx     History   Social History  . Marital Status: Married    Spouse Name: N/A    Number of Children: 2  . Years of Education: college BS   Occupational History  . retired    Social History Main Topics  . Smoking status: Never Smoker   . Smokeless tobacco: Never Used  . Alcohol Use: No  . Drug Use: No  . Sexual Activity: Yes   Other Topics Concern  . None   Social History Narrative   HHof 2    Non smoker   2 children  Also twins that did not survive birth   Married    CB x 2   No pets     Outpatient Encounter Prescriptions as of 07/23/2013  Medication Sig  . Cholecalciferol (VITAMIN D3) 2000 UNITS TABS Take by mouth.  . clonazePAM (KLONOPIN) 1 MG tablet 1/2 mg qhs regularly  . omeprazole (PRILOSEC) 40 MG capsule Take 1 capsule (40 mg total) by mouth daily with breakfast.  . rizatriptan (MAXALT) 10 MG tablet Take 10 mg by mouth as needed. May repeat in 2 hours if needed   . trimethoprim (TRIMPEX) 100 MG tablet Take 1  tablet (100 mg total) by mouth as needed (uti prevention).  . verapamil (CALAN-SR) 240 MG CR tablet Take 1 tablet (240 mg total) by mouth at bedtime.  . [DISCONTINUED] trimethoprim (TRIMPEX) 100 MG tablet Take 100 mg by mouth as needed (uti prevention). Take 1 po as directed for uti prevention  . lisinopril (PRINIVIL,ZESTRIL) 10 MG tablet Take 1 tablet (10 mg total) by mouth daily. For blood pressure  . [DISCONTINUED] Cholecalciferol (VITAMIN D3) 1000 UNITS CAPS Take 1 capsule by mouth daily.  . [DISCONTINUED] meloxicam (MOBIC) 7.5 MG tablet Take 1-2 tablets (7.5-15 mg total) by mouth daily.    EXAM:  BP 130/82  Temp(Src) 98.1 F (36.7 C) (Oral)  Ht 5\' 6"  (1.676 m)  Wt 135 lb (61.236 kg)  BMI 21.80 kg/m2  Body mass index is 21.8 kg/(m^2). Repeat bp both arms  Left 138/80 right 130/82  GENERAL: vitals reviewed and listed above, alert, oriented, appears well hydrated and in no acute distress HEENT: atraumatic, conjunctiva  clear, no obvious abnormalities on  inspection of external nose and ears NECK: no obvious masses on inspection palpation  CV: HRRR, no clubbing cyanosis or  peripheral edema nl cap refill  MS: moves all extremities without noticeable focal  abnormality PSYCH: pleasant and cooperative, no obvious depression or anxiety Lab Results  Component Value Date   WBC 5.7 04/15/2013   HGB 14.0 04/15/2013   HCT 40.6 04/15/2013   PLT 208 04/15/2013   GLUCOSE 123* 04/15/2013   CHOL 202* 04/16/2013   TRIG 79 04/16/2013   HDL 69 04/16/2013   LDLDIRECT 109.8 05/01/2012   LDLCALC 117* 04/16/2013   ALT 11 04/15/2013   AST 14 04/15/2013   NA 142 04/15/2013   K 3.8 04/15/2013   CL 102 04/15/2013   CREATININE 0.81 04/15/2013   BUN 18 04/15/2013   CO2 25 04/15/2013   TSH 1.87 03/09/2013   HGBA1C 6.0* 04/15/2013   reveiwed labs  ASSESSMENT AND PLAN:  Discussed the following assessment and plan:  Hypertension - ok in office not at goal prev but could be effecte by prebv meloxicam and pain now  better follow and add on ace if needed and fu  in 1-2 m  Hyperglycemia - plan fasting bmp and hga 1c   Acid reflux To add med if needed -Patient advised to return or notify health care team  if symptoms worsen ,persist or new concerns arise.  Patient Instructions   glad you shoulder is better .   continue maintenance exercise .  Will send dr  Tamala Julian a note about this. Marland Kitchen  bp in office today was better .  And at goal.    Check readings  3 x per week  Get fasting labs rto check blood  Sugar issue also.  We can add another bp medicatoin if needed to reach goal.    Mariann Laster K. Panosh M.D.  Pre visit review using our clinic review tool, if applicable. No additional management support is needed unless otherwise documented below in the visit note.

## 2013-07-23 NOTE — Patient Instructions (Signed)
  glad you shoulder is better .   continue maintenance exercise .  Will send dr  Tamala Julian a note about this. Marland Kitchen  bp in office today was better .  And at goal.    Check readings  3 x per week  Get fasting labs rto check blood  Sugar issue also.  We can add another bp medicatoin if needed to reach goal.

## 2013-07-25 ENCOUNTER — Ambulatory Visit
Admission: RE | Admit: 2013-07-25 | Discharge: 2013-07-25 | Disposition: A | Discharge status: Home / Self Care | Payer: Medicare Other | Source: Ambulatory Visit

## 2013-07-25 DIAGNOSIS — Z1231 Encounter for screening mammogram for malignant neoplasm of breast: Secondary | ICD-10-CM | POA: Diagnosis not present

## 2013-08-07 ENCOUNTER — Telehealth: Payer: Self-pay | Admitting: Internal Medicine

## 2013-08-07 MED ORDER — OMEPRAZOLE 40 MG PO CPDR: 40.0000 mg | DELAYED_RELEASE_CAPSULE | Freq: Every day | ORAL | Status: DC

## 2013-08-07 NOTE — Telephone Encounter (Signed)
Sent by e-scribe. 

## 2013-08-07 NOTE — Telephone Encounter (Signed)
[facility] - [place], [place] - [address] is requesting re-fill on omeprazole (PRILOSEC) 40 MG capsule

## 2013-09-10 ENCOUNTER — Telehealth: Payer: Self-pay | Admitting: Internal Medicine

## 2013-09-10 NOTE — Telephone Encounter (Signed)
Patient would like to stop klonazepam you prescribed.  Please advise if ok to stop or does she need to wean off of it and if so how?

## 2013-09-10 NOTE — Telephone Encounter (Signed)
At current dose I suspect she could go every other night for a week and stop If sxs return she needs to restart - sxs she took it for

## 2013-09-10 NOTE — Telephone Encounter (Signed)
I have left a detailed message for the patient with Dr. Celesta Aver recommendations.  They are asked to call back for any additional questions or concerns

## 2013-09-13 ENCOUNTER — Other Ambulatory Visit (INDEPENDENT_AMBULATORY_CARE_PROVIDER_SITE_OTHER): Payer: Medicare Other

## 2013-09-13 DIAGNOSIS — I10 Essential (primary) hypertension: Secondary | ICD-10-CM

## 2013-09-13 DIAGNOSIS — R739 Hyperglycemia, unspecified: Secondary | ICD-10-CM

## 2013-09-13 DIAGNOSIS — R7309 Other abnormal glucose: Secondary | ICD-10-CM

## 2013-09-13 LAB — BASIC METABOLIC PANEL
BUN: 21 mg/dL (ref 6–23)
CALCIUM: 9.5 mg/dL (ref 8.4–10.5)
CO2: 28 mEq/L (ref 19–32)
CREATININE: 1 mg/dL (ref 0.4–1.2)
Chloride: 105 mEq/L (ref 96–112)
GFR: 62.43 mL/min (ref >60.00)
Glucose, Bld: 96 mg/dL (ref 70–99)
Potassium: 3.5 mEq/L (ref 3.5–5.1)
Sodium: 142 mEq/L (ref 135–145)

## 2013-09-13 LAB — HEMOGLOBIN A1C: HEMOGLOBIN A1C: 5.8 % (ref 4.6–6.5)

## 2013-09-19 ENCOUNTER — Emergency Department (HOSPITAL_COMMUNITY): Payer: Medicare Other

## 2013-09-19 ENCOUNTER — Encounter (HOSPITAL_COMMUNITY): Payer: Self-pay | Admitting: Emergency Medicine

## 2013-09-19 ENCOUNTER — Telehealth: Payer: Self-pay | Admitting: Internal Medicine

## 2013-09-19 ENCOUNTER — Emergency Department (HOSPITAL_COMMUNITY)
Admission: EM | Admit: 2013-09-19 | Discharge: 2013-09-19 | Disposition: A | Discharge status: Home / Self Care | Payer: Medicare Other | Attending: Emergency Medicine | Admitting: Emergency Medicine

## 2013-09-19 DIAGNOSIS — Z8739 Personal history of other diseases of the musculoskeletal system and connective tissue: Secondary | ICD-10-CM | POA: Diagnosis not present

## 2013-09-19 DIAGNOSIS — G43909 Migraine, unspecified, not intractable, without status migrainosus: Secondary | ICD-10-CM | POA: Insufficient documentation

## 2013-09-19 DIAGNOSIS — F3289 Other specified depressive episodes: Secondary | ICD-10-CM | POA: Insufficient documentation

## 2013-09-19 DIAGNOSIS — R112 Nausea with vomiting, unspecified: Secondary | ICD-10-CM | POA: Diagnosis not present

## 2013-09-19 DIAGNOSIS — K219 Gastro-esophageal reflux disease without esophagitis: Secondary | ICD-10-CM | POA: Insufficient documentation

## 2013-09-19 DIAGNOSIS — R42 Dizziness and giddiness: Secondary | ICD-10-CM | POA: Diagnosis not present

## 2013-09-19 DIAGNOSIS — I10 Essential (primary) hypertension: Secondary | ICD-10-CM | POA: Insufficient documentation

## 2013-09-19 DIAGNOSIS — R404 Transient alteration of awareness: Secondary | ICD-10-CM | POA: Diagnosis not present

## 2013-09-19 DIAGNOSIS — F329 Major depressive disorder, single episode, unspecified: Secondary | ICD-10-CM | POA: Diagnosis not present

## 2013-09-19 DIAGNOSIS — Z862 Personal history of diseases of the blood and blood-forming organs and certain disorders involving the immune mechanism: Secondary | ICD-10-CM | POA: Diagnosis not present

## 2013-09-19 DIAGNOSIS — Z8709 Personal history of other diseases of the respiratory system: Secondary | ICD-10-CM | POA: Diagnosis not present

## 2013-09-19 DIAGNOSIS — F411 Generalized anxiety disorder: Secondary | ICD-10-CM | POA: Diagnosis not present

## 2013-09-19 DIAGNOSIS — Z8639 Personal history of other endocrine, nutritional and metabolic disease: Secondary | ICD-10-CM | POA: Insufficient documentation

## 2013-09-19 DIAGNOSIS — Z79899 Other long term (current) drug therapy: Secondary | ICD-10-CM | POA: Diagnosis not present

## 2013-09-19 LAB — BASIC METABOLIC PANEL
BUN: 17 mg/dL (ref 6–23)
CALCIUM: 8.8 mg/dL (ref 8.4–10.5)
CO2: 26 meq/L (ref 19–32)
CREATININE: 0.83 mg/dL (ref 0.50–1.10)
Chloride: 103 mEq/L (ref 96–112)
GFR calc non Af Amer: 72 mL/min — ABNORMAL LOW (ref >90)
GFR, EST AFRICAN AMERICAN: 83 mL/min — AB (ref >90)
Glucose, Bld: 97 mg/dL (ref 70–99)
Potassium: 4.1 mEq/L (ref 3.7–5.3)
SODIUM: 141 meq/L (ref 137–147)

## 2013-09-19 LAB — CBC WITH DIFFERENTIAL/PLATELET
BASOS ABS: 0 10*3/uL (ref 0.0–0.1)
Basophils Relative: 0 % (ref 0–1)
EOS PCT: 0 % (ref 0–5)
Eosinophils Absolute: 0 10*3/uL (ref 0.0–0.7)
HCT: 37.4 % (ref 36.0–46.0)
Hemoglobin: 12.6 g/dL (ref 12.0–15.0)
LYMPHS PCT: 8 % — AB (ref 12–46)
Lymphs Abs: 0.7 10*3/uL (ref 0.7–4.0)
MCH: 31.1 pg (ref 26.0–34.0)
MCHC: 33.7 g/dL (ref 30.0–36.0)
MCV: 92.3 fL (ref 78.0–100.0)
Monocytes Absolute: 0.5 10*3/uL (ref 0.1–1.0)
Monocytes Relative: 6 % (ref 3–12)
Neutro Abs: 7.1 10*3/uL (ref 1.7–7.7)
Neutrophils Relative %: 86 % — ABNORMAL HIGH (ref 43–77)
PLATELETS: 194 10*3/uL (ref 150–400)
RBC: 4.05 MIL/uL (ref 3.87–5.11)
RDW: 13.5 % (ref 11.5–15.5)
WBC: 8.3 10*3/uL (ref 4.0–10.5)

## 2013-09-19 LAB — URINALYSIS, ROUTINE W REFLEX MICROSCOPIC
Bilirubin Urine: NEGATIVE
GLUCOSE, UA: NEGATIVE mg/dL
Hgb urine dipstick: NEGATIVE
Ketones, ur: 15 mg/dL — AB
LEUKOCYTES UA: NEGATIVE
Nitrite: NEGATIVE
PROTEIN: NEGATIVE mg/dL
Specific Gravity, Urine: 1.014 (ref 1.005–1.030)
Urobilinogen, UA: 0.2 mg/dL (ref 0.0–1.0)
pH: 7.5 (ref 5.0–8.0)

## 2013-09-19 MED ORDER — SODIUM CHLORIDE 0.9 % IV BOLUS (SEPSIS)
500.0000 mL | Freq: Once | INTRAVENOUS | Status: AC
  Administered 2013-09-19: 500 mL via INTRAVENOUS

## 2013-09-19 MED ORDER — MECLIZINE HCL 25 MG PO TABS: 25.0000 mg | ORAL_TABLET | Freq: Three times a day (TID) | ORAL | Status: DC | PRN

## 2013-09-19 MED ORDER — DIAZEPAM 5 MG/ML IJ SOLN
5.0000 mg | Freq: Once | INTRAMUSCULAR | Status: AC
  Administered 2013-09-19: 5 mg via INTRAVENOUS
  Filled 2013-09-19: qty 2

## 2013-09-19 MED ORDER — MECLIZINE HCL 25 MG PO TABS
25.0000 mg | ORAL_TABLET | Freq: Once | ORAL | Status: AC
  Administered 2013-09-19: 25 mg via ORAL
  Filled 2013-09-19: qty 1

## 2013-09-19 NOTE — ED Notes (Signed)
Walked patient to the bathroom patient did fine

## 2013-09-19 NOTE — ED Notes (Signed)
Patient transported to CT 

## 2013-09-19 NOTE — ED Notes (Signed)
Pt from home, c/o dizziness and nausea starting this am

## 2013-09-19 NOTE — ED Provider Notes (Signed)
CSN: [id number]     Arrival date & time 09/19/13  1023 History   First MD Initiated Contact with Patient 09/19/13 1029     Chief Complaint  Patient presents with  . Dizziness     (Consider location/radiation/quality/duration/timing/severity/associated sxs/prior Treatment) HPI Comments: Patient presents to ER for evaluation of dizziness. Patient had acute onset dizziness this morning when she over and got out of bed. She suddenly felt off balanced and like she was spinning. Symptoms were waxing and waning for a while and then worsened. She had onset of nausea and vomiting associated with the vertigo-type symptoms. She did not have a headache. There was no chest pain or shortness of breath. Patient denied heart palpitations. She has never had similar symptoms.  Patient is a 68 y.o. female presenting with dizziness.  Dizziness Associated symptoms: nausea and vomiting     Past Medical History  Diagnosis Date  . Anxiety   . Hyperlipidemia   . GERD (gastroesophageal reflux disease)   . Osteopenia     dexa -2.0 hip 2008  . Rapid heart rate     hx of   . Laryngitis   . Hypertension   . Migraine     "not very often anymore; they were more menopausal" (04/16/2013)  . Depression    Past Surgical History  Procedure Laterality Date  . Colonoscopy  02/03/2004    internal hemorrhoids  . Upper gastrointestinal endoscopy  08/29/2008    gastritis, hiatal hernia   Family History  Problem Relation Age of Onset  . Hypertension Mother   . Migraines Mother   . Arthritis Father   . Ovarian cancer Maternal Grandmother   . Seizures Neg Hx    History  Substance Use Topics  . Smoking status: Never Smoker   . Smokeless tobacco: Never Used  . Alcohol Use: No   OB History   Grav Para Term Preterm Abortions TAB SAB Ect Mult Living                 Review of Systems  Gastrointestinal: Positive for nausea and vomiting.  Neurological: Positive for dizziness.  All other systems reviewed and are  negative.     Allergies  Nitrofurantoin; Erythromycin ethylsuccinate; and Sulfadiazine  Home Medications   Prior to Admission medications   Medication Sig Start Date End Date Taking? Authorizing Provider  cholecalciferol (VITAMIN D) 1000 UNITS tablet Take 2,000 Units by mouth daily.   Yes Historical Provider, MD  clonazePAM (KLONOPIN) 1 MG tablet Take 0.5 mg by mouth at bedtime.  03/16/13  Yes Gatha Mayer, MD  Multiple Vitamin (MULTIVITAMIN WITH MINERALS) TABS tablet Take 1 tablet by mouth daily.   Yes Historical Provider, MD  omeprazole (PRILOSEC) 40 MG capsule Take 40 mg by mouth daily before breakfast.   Yes Historical Provider, MD  trimethoprim (TRIMPEX) 100 MG tablet Take 100 mg by mouth daily as needed (UTI symptoms).    Yes Historical Provider, MD  verapamil (CALAN-SR) 240 MG CR tablet Take 1 tablet (240 mg total) by mouth at bedtime. 05/04/13  Yes Burnis Medin, MD   BP 165/86  Pulse 85  Temp(Src) 97.5 F (36.4 C) (Oral)  Resp 16  SpO2 98% Physical Exam  Constitutional: She is oriented to person, place, and time. She appears well-developed and well-nourished. No distress.  HENT:  Head: Normocephalic and atraumatic.  Right Ear: Hearing normal.  Left Ear: Hearing normal.  Nose: Nose normal.  Mouth/Throat: Oropharynx is clear and moist and mucous membranes  are normal.  Eyes: Conjunctivae and EOM are normal. Pupils are equal, round, and reactive to light.  Neck: Normal range of motion. Neck supple.  Cardiovascular: Regular rhythm, S1 normal and S2 normal.  Exam reveals no gallop and no friction rub.   No murmur heard. Pulmonary/Chest: Effort normal and breath sounds normal. No respiratory distress. She exhibits no tenderness.  Abdominal: Soft. Normal appearance and bowel sounds are normal. There is no hepatosplenomegaly. There is no tenderness. There is no rebound, no guarding, no tenderness at McBurney's point and negative Murphy's sign. No hernia.  Musculoskeletal:  Normal range of motion.  Neurological: She is alert and oriented to person, place, and time. She has normal strength. No cranial nerve deficit or sensory deficit. Coordination normal. GCS eye subscore is 4. GCS verbal subscore is 5. GCS motor subscore is 6.  Extraocular muscle movement: normal No visual field cut Pupils: equal and reactive both direct and consensual response is normal Bilateral lateral nystagmus present           Sensory function is intact to light touch, pinprick Proprioception intact  Grip strength 5/5 symmetric in upper extremities Lower extremity strength 5/5 against gravity No pronator drift Normal finger to nose bilaterally Normal heel to shin bilaterally  Gait: normal Rhomberg: normal   Skin: Skin is warm, dry and intact. No rash noted. No cyanosis.  Psychiatric: She has a normal mood and affect. Her speech is normal and behavior is normal. Thought content normal.    ED Course  Procedures (including critical care time) Labs Review Labs Reviewed  CBC WITH DIFFERENTIAL - Abnormal; Notable for the following:    Neutrophils Relative % 86 (*)    Lymphocytes Relative 8 (*)    All other components within normal limits  BASIC METABOLIC PANEL - Abnormal; Notable for the following:    GFR calc non Af Amer 72 (*)    GFR calc Af Amer 83 (*)    All other components within normal limits  URINALYSIS, ROUTINE W REFLEX MICROSCOPIC - Abnormal; Notable for the following:    Ketones, ur 15 (*)    All other components within normal limits    Imaging Review Ct Head Wo Contrast  09/19/2013   CLINICAL DATA:  Dizziness, nausea  EXAM: CT HEAD WITHOUT CONTRAST  TECHNIQUE: Contiguous axial images were obtained from the base of the skull through the vertex without intravenous contrast.  COMPARISON:  04/15/2013  FINDINGS: No skull fracture is noted. Paranasal sinuses and mastoid air cells are unremarkable. No intracranial hemorrhage, mass effect or midline shift. No hydrocephalus.  The gray and white-matter differentiation is preserved. No acute cortical infarction. No mass lesion is noted on this unenhanced scan.  IMPRESSION: No acute intracranial abnormality.   Electronically Signed   By: Lahoma Crocker M.D.   On: 09/19/2013 12:49     EKG Interpretation   Date/Time:  Wednesday September 19 2013 10:27:32 EDT Ventricular Rate:  81 PR Interval:  149 QRS Duration: 92 QT Interval:  381 QTC Calculation: 442 R Axis:   79 Text Interpretation:  Sinus rhythm Normal ECG Confirmed by POLLINA  MD,  CHRISTOPHER [phone]) on 09/19/2013 1:55:20 PM      MDM   Final diagnoses:  Vertigo   Patient presents to the ER for evaluation of dizziness. Patient had acute onset of vertiginous type dizziness this morning. She got out of bed and symptoms suddenly began. She had some improvement by time of arrival to the ER but symptoms were still present.  She reported significant worsening with changes in the position of her head, such as bending forward. This seems consistent with benign positional vertigo, not a central process. Patient's workup has been negative. CT was unremarkable. Patient has had complete resolution of symptoms with fluids, meclizine and Valium. She was therefore discharged with symptomatic treatment. Followup with primary doctor.    Orpah Greek, MD 09/20/13 [phone]

## 2013-09-19 NOTE — Discharge Instructions (Signed)

## 2013-09-19 NOTE — Telephone Encounter (Signed)
Patient Information:  Caller Name: Brianna Harper  Phone: [phone]  Patient: Brianna Harper  Gender: Female  DOB: [birth year]  Age: 68 Years  PCP: Shanon Ace (Family Practice)  Office Follow Up:  Does the office need to follow up with this patient?: Yes  Instructions For The Office: PATIENT WOULD LIKE TO BE SEEN IN OFFICE. REQUESTING APPT TIME. ED VS OFFICE/ PLEASE ADVISE AND CONTACT PATIENT.  RN Note:  Patient declines ED wants to be seen in office.  PLEASE REVIEW ADVISE AND CONTACT PATIENT .  Symptoms  Reason For Call & Symptoms: Husband [name] on the phone , he states wife was dizzy upon waking this morning. She is currently sitting on the bathroom floor after taking a shower. Wife to phone.  She states" I am nasueated and dizzy". She has not fainted.  No illness. No other complaints. .  No Chest pain , feels slightly short of breath and states "I can't stand up without my husbands help". No recent illness or n/v/d.Marland Kitchen  Voice is clear and purposeful.  She declines ED and wants to be seen in the office.  Reviewed Health History In EMR: Yes  Reviewed Medications In EMR: Yes  Reviewed Allergies In EMR: Yes  Reviewed Surgeries / Procedures: Yes  Date of Onset of Symptoms: 09/20/2013  Guideline(s) Used:  Dizziness  Disposition Per Guideline:   Go to ED Now (or to Office with PCP Approval)  Reason For Disposition Reached:   Severe dizziness (e.g., unable to stand, requires support to walk, feels like passing out now)  Advice Given:  Cool Off:  If the weather is hot, apply a cold compress to the forehead or take a cool shower or bath.  Call Back If:  Still feel dizzy after 2 hours of rest and fluids  Passes out (faints)  You become worse.  Patient Refused Recommendation:  Patient Refused Care Advice  PATIENT WOULD LIKE TO BE SEEN IN OFFICE. REQUESTING APPT TIME. ED VS OFFICE/ PLEASE ADVISE AND CONTACT PATIENT.

## 2013-09-19 NOTE — Telephone Encounter (Signed)
Pls advise.  

## 2013-09-19 NOTE — Telephone Encounter (Signed)
Called and spoke with husband and advised that pt be seen in ED.  Husband states the EMS is there now.

## 2013-09-20 ENCOUNTER — Ambulatory Visit (INDEPENDENT_AMBULATORY_CARE_PROVIDER_SITE_OTHER): Payer: Medicare Other | Admitting: Internal Medicine

## 2013-09-20 ENCOUNTER — Encounter: Payer: Self-pay | Admitting: Internal Medicine

## 2013-09-20 VITALS — BP 140/80 | Temp 98.9°F | Ht 66.0 in | Wt 132.0 lb

## 2013-09-20 DIAGNOSIS — Z79899 Other long term (current) drug therapy: Secondary | ICD-10-CM | POA: Diagnosis not present

## 2013-09-20 DIAGNOSIS — R7309 Other abnormal glucose: Secondary | ICD-10-CM | POA: Diagnosis not present

## 2013-09-20 DIAGNOSIS — R42 Dizziness and giddiness: Secondary | ICD-10-CM

## 2013-09-20 DIAGNOSIS — G459 Transient cerebral ischemic attack, unspecified: Secondary | ICD-10-CM

## 2013-09-20 DIAGNOSIS — R739 Hyperglycemia, unspecified: Secondary | ICD-10-CM

## 2013-09-20 DIAGNOSIS — I10 Essential (primary) hypertension: Secondary | ICD-10-CM | POA: Insufficient documentation

## 2013-09-20 MED ORDER — CLONAZEPAM 1 MG PO TABS: 0.5000 mg | ORAL_TABLET | Freq: Every day | ORAL | Status: DC

## 2013-09-20 NOTE — Progress Notes (Signed)
Pre visit review using our clinic review tool, if applicable. No additional management support is needed unless otherwise documented below in the visit note.   Chief Complaint  Patient presents with  . Follow-up    bp lab and ed visityesterday    HPI: Fu planned but also fu from ed yesterday : had  acute vertigo and neg ct scan and ;abs  Extremely  Nauseous  And vertigo in shower and coudnt stand up. See CAN and ed visit note  Given valium in ed   Home and slept also antivert  Here today with husband  To go to [place] in 5 days and river cruise .  Concern about ? Travel. No fever dv sx  Trying to wean the clonipen   at night given to her by dr Carlean Purl But hasnt started  Has many ? About this issue no h x of same no new meds trauma  BP :had been good  ROS: See pertinent positives and negatives per HPI.no fever ha hearing changes pr sounds or wealness numbness   Past Medical History  Diagnosis Date  . Anxiety   . Hyperlipidemia   . GERD (gastroesophageal reflux disease)   . Osteopenia     dexa -2.0 hip 2008  . Rapid heart rate     hx of   . Laryngitis   . Hypertension   . Migraine     "not very often anymore; they were more menopausal" (04/16/2013)  . Depression     Family History  Problem Relation Age of Onset  . Hypertension Mother   . Migraines Mother   . Arthritis Father   . Ovarian cancer Maternal Grandmother   . Seizures Neg Hx     History   Social History  . Marital Status: Married    Spouse Name: N/A    Number of Children: 2  . Years of Education: college BS   Occupational History  . retired    Social History Main Topics  . Smoking status: Never Smoker   . Smokeless tobacco: Never Used  . Alcohol Use: No  . Drug Use: No  . Sexual Activity: Yes   Other Topics Concern  . None   Social History Narrative   HHof 2    Non smoker   2 children  Also twins that did not survive birth   Married    CB x 2   No pets     Outpatient Encounter Prescriptions  as of 09/20/2013  Medication Sig  . cholecalciferol (VITAMIN D) 1000 UNITS tablet Take 2,000 Units by mouth daily.  . clonazePAM (KLONOPIN) 1 MG tablet Take 0.5 tablets (0.5 mg total) by mouth at bedtime. Wean as tolerated.  . meclizine (ANTIVERT) 25 MG tablet Take 1 tablet (25 mg total) by mouth 3 (three) times daily as needed for dizziness.  . Multiple Vitamin (MULTIVITAMIN WITH MINERALS) TABS tablet Take 1 tablet by mouth daily.  Marland Kitchen omeprazole (PRILOSEC) 40 MG capsule Take 40 mg by mouth daily before breakfast.  . trimethoprim (TRIMPEX) 100 MG tablet Take 100 mg by mouth daily as needed (UTI symptoms).   . verapamil (CALAN-SR) 240 MG CR tablet Take 1 tablet (240 mg total) by mouth at bedtime.  . [DISCONTINUED] clonazePAM (KLONOPIN) 1 MG tablet Take 0.5 mg by mouth at bedtime.   . [DISCONTINUED] Cholecalciferol (VITAMIN D3) 2000 UNITS TABS Take by mouth.  . [DISCONTINUED] lisinopril (PRINIVIL,ZESTRIL) 10 MG tablet Take 1 tablet (10 mg total) by mouth daily. For blood  pressure  . [DISCONTINUED] omeprazole (PRILOSEC) 40 MG capsule Take 1 capsule (40 mg total) by mouth daily with breakfast.  . [DISCONTINUED] rizatriptan (MAXALT) 10 MG tablet Take 10 mg by mouth as needed. May repeat in 2 hours if needed  . [DISCONTINUED] trimethoprim (TRIMPEX) 100 MG tablet Take 1 tablet (100 mg total) by mouth as needed (uti prevention).    EXAM:  BP 140/80  Temp(Src) 98.9 F (37.2 C) (Oral)  Ht 5\' 6"  (1.676 m)  Wt 132 lb (59.875 kg)  BMI 21.32 kg/m2  Body mass index is 21.32 kg/(m^2).  GENERAL: vitals reviewed and listed above, alert, oriented, appears well hydrated and in no acute distress tired alert speech nl gingerly walking keeping head straight  HEENT: atraumatic, conjunctiva  clear, no obvious abnormalities on inspection of external nose and ears OP : no lesion edema or exudate  NECK: no obvious masses on inspection palpation   CV: HRRR, no clubbing cyanosis or  peripheral edema nl cap refill   MS: moves all extremities without noticeable focal  abnormality PSYCH: pleasant and cooperative, BP Readings from Last 3 Encounters:  09/20/13 140/80  09/19/13 132/69  07/23/13 130/82  labs reviewed  Lab Results  Component Value Date   WBC 8.3 09/19/2013   HGB 12.6 09/19/2013   HCT 37.4 09/19/2013   PLT 194 09/19/2013   GLUCOSE 97 09/19/2013   CHOL 202* 04/16/2013   TRIG 79 04/16/2013   HDL 69 04/16/2013   LDLDIRECT 109.8 05/01/2012   LDLCALC 117* 04/16/2013   ALT 11 04/15/2013   AST 14 04/15/2013   NA 141 09/19/2013   K 4.1 09/19/2013   CL 103 09/19/2013   CREATININE 0.83 09/19/2013   BUN 17 09/19/2013   CO2 26 09/19/2013   TSH 1.87 03/09/2013   HGBA1C 5.8 09/13/2013     ASSESSMENT AND PLAN:  Discussed the following assessment and plan:  Acute onset of severe vertigo - seen in ed neg ct san felt to be peripheral expectant managment uncertain how she will fee next week for travel follow acitivy as tolerated   Hyperglycemia - better  Unspecified essential hypertension - had been better  Medication management - rx for small amount of klonipen given prev by dr g to have o hand incase of anxiety or extreme vertigo Total visit 45mins > 50% spent counseling and coordinating care  dsic with husband about travel can decide later contact [place] if not ready for travel  . Doesn't get plane sick  Gait is steady today unless turns head fast. Reviewed inner ear function etc . antivert doesn t change the course of the illness.can use as needed for comfort -Patient advised to return or notify health care team  if symptoms worsen ,persist or new concerns arise.  Patient Instructions  Ok to  Have klonipen on hand .  Can be used for anxiety if needed. Expect you to continue to improve . uncertain how quickly. Contact [place] if need a note for travel. No change in blood pressure  Medication. Labs are good. ROV in 4-6 months .     Standley Brooking. Panosh M.D.

## 2013-09-20 NOTE — Patient Instructions (Signed)
Ok to  Have klonipen on hand .  Can be used for anxiety if needed. Expect you to continue to improve . uncertain how quickly. Contact [place] if need a note for travel. No change in blood pressure  Medication. Labs are good. ROV in 4-6 months .

## 2013-09-24 ENCOUNTER — Telehealth: Payer: Self-pay | Admitting: Internal Medicine

## 2013-09-24 NOTE — Telephone Encounter (Signed)
Misty i did a letter   Please print out  Or whatever to get it to them .

## 2013-09-24 NOTE — Telephone Encounter (Signed)
Patient notified to pick up at the front desk. 

## 2013-09-24 NOTE — Telephone Encounter (Signed)
Patient was hospitilized for on last week on  6/24 and was seen in the office on 6/25.  She and her son was suppose to travel to [place] on tomorrow however, due to her illness she is unable to travel.  Pt requesting a letter written for her son that states mother is ill and can not travel.  Son's name is Event organiser Hoot.([email or web address] address [email or web address]).  Patient advised letter would not be emailed she would need to come in and pick it up. This is a time sensitive request and needs letter before Wednesday per travel agent guidelines.

## 2013-10-02 ENCOUNTER — Telehealth: Payer: Self-pay | Admitting: Internal Medicine

## 2013-10-02 NOTE — Telephone Encounter (Signed)
Pt is calling back checking on status of ins forms she dropped out on 09-26-13.

## 2013-10-03 NOTE — Telephone Encounter (Signed)
Will check with WP to check status.

## 2013-10-03 NOTE — Telephone Encounter (Signed)
Still working on it will get it done in the n The date of advice to interrupt travel will end up being 6 29 . Also please advise  How she is doing  Currently.

## 2013-10-04 NOTE — Telephone Encounter (Signed)
Paper work is ready.  Left a message for the pt to return my call.

## 2013-10-04 NOTE — Telephone Encounter (Signed)
Pt notified to pick up at the front desk. 

## 2013-11-01 NOTE — Telephone Encounter (Signed)
Noted  

## 2013-11-12 ENCOUNTER — Other Ambulatory Visit (INDEPENDENT_AMBULATORY_CARE_PROVIDER_SITE_OTHER): Payer: Medicare Other

## 2013-11-12 ENCOUNTER — Encounter: Payer: Self-pay | Admitting: Family Medicine

## 2013-11-12 ENCOUNTER — Ambulatory Visit (INDEPENDENT_AMBULATORY_CARE_PROVIDER_SITE_OTHER): Payer: Medicare Other | Admitting: Family Medicine

## 2013-11-12 VITALS — BP 122/78 | HR 84 | Ht 66.0 in | Wt 134.0 lb

## 2013-11-12 DIAGNOSIS — M25519 Pain in unspecified shoulder: Secondary | ICD-10-CM

## 2013-11-12 DIAGNOSIS — M25511 Pain in right shoulder: Secondary | ICD-10-CM

## 2013-11-12 DIAGNOSIS — M19019 Primary osteoarthritis, unspecified shoulder: Secondary | ICD-10-CM | POA: Diagnosis not present

## 2013-11-12 NOTE — Progress Notes (Signed)
[facility] [place], [place] [address] Phone: [phone] Subjective:    CC: Right shoulder pain  [id number] Brianna Harper is a 68 y.o. female coming in with complaint of right shoulder pain. Patient was seen previously and did have a mild subacromial bursitis. Patient did state that the pain did improve for approximately 4 months and then slowly started coming back. Patient states that this seems to be similar but much more specific to the top of her shoulder and still continuing to have radiation of pain down her arm. Patient states that it is so sore does wake her up at night. States that it can be significantly uncomfortable with certain activities. Especially reaching across her body. Denies any numbness or weakness. Not responding to over-the-counter medications.    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     Past medical history, social, surgical and family history all reviewed in electronic medical record.   Review of Systems: No headache, visual changes, nausea, vomiting, diarrhea, constipation, dizziness, abdominal pain, skin rash, fevers, chills, night sweats, weight loss, swollen lymph nodes, body aches, joint swelling, muscle aches, chest pain, shortness of breath, mood changes.   Objective Blood pressure 122/78, pulse 84, height 5\' 6"  (1.676 m), weight 134 lb (60.782 kg), SpO2 97.00%.  General: No apparent distress alert and oriented x3 mood and affect normal, dressed appropriately.  HEENT: Pupils equal, extraocular movements intact  Respiratory: Patient's speak in full sentences and does not appear short of breath  Cardiovascular: No lower extremity edema, non tender, no erythema  Skin: Warm dry intact with no signs of infection or rash on extremities or on axial skeleton.  Abdomen: Soft nontender  Neuro:  Cranial nerves II through XII are intact, neurovascularly intact in all extremities with 2+ DTRs and 2+ pulses.  Lymph: No lymphadenopathy of posterior or anterior cervical chain or axillae bilaterally.  Gait normal with good balance and coordination.  MSK:  Non tender with full range of motion and good stability and symmetric strength and tone of elbows, wrist, hip, knee and ankles bilaterally.  Neck: Inspection unremarkable mild increase in lordosis No palpable stepoffs. Negative Spurling's maneuver. Full neck range of motion Grip strength and sensation normal in bilateral hands Strength good C4 to T1 distribution No sensory change to C4 to T1 Negative Hoffman sign bilaterally Reflexes normal Shoulder: Right Inspection reveals no abnormalities, atrophy or asymmetry. Palpation is normal with no tenderness over AC joint or bicipital groove. ROM is full in all planes passively but does have pain in the extreme internal and external range of motion as well as forward flexion. Rotator cuff strength normal throughout. signs of impingement with a positive Neer and Hawkin's tests, empty can sign. Speeds and Yergason's tests normal. No labral pathology noted with negative Obrien's, negative clunk and good stability. Normal scapular function observed. No painful arc and no drop arm sign. No apprehension sign Contralateral shoulder unremarkable  MSK US performed of: Right shoulder This study was ordered, performed, and interpreted by Charlann Boxer D.O.  Shoulder:   Supraspinatus:  Appears normal on long and transverse views, no bursal bulge seen with shoulder abduction on impingement view. Infraspinatus:  Appears normal on long and transverse views. Subscapularis:  Appears normal on long and transverse views patient though does have a bursitis noted. Teres Minor:  Appears  normal on long and transverse views. AC joint:  Capsule is distended with moderate osteophytic changes. Glenohumeral  Joint:  Appears normal without effusion. Glenoid Labrum:  Intact without visualized tears. Biceps Tendon:  Appears normal on long and transverse views, no fraying of tendon, tendon located in intertubercular groove, no subluxation with shoulder internal or external rotation. No increased power doppler signal.  Impression: Mild subacromial bursitis moderate a.c. arthritis  Procedure: Real-time Ultrasound Guided Injection of right acromial clavicular joint Device: GE Logiq E  Ultrasound guided injection is preferred based studies that show increased duration, increased effect, greater accuracy, decreased procedural pain, increased response rate with ultrasound guided versus blind injection.  Verbal informed consent obtained.  Time-out conducted.  Noted no overlying erythema, induration, or other signs of local infection.  Skin prepped in a sterile fashion.  Local anesthesia: Topical Ethyl chloride.  With sterile technique and under real time ultrasound guidance:  Joint visualized.  25g  inch needle inserted posterior approach. Pictures taken for needle placement. Patient did have injection of  2 cc of 0.5% Marcaine, and 0.5 cc of Kenalog 40 mg/dL. Completed without difficulty  Pain immediately resolved suggesting accurate placement of the medication.  Advised to call if fevers/chills, erythema, induration, drainage, or persistent bleeding.  Images permanently stored and available for review in the ultrasound unit.  Impression: Technically successful ultrasound guided injection.    Impression and Recommendations:     This case required medical decision making of moderate complexity.

## 2013-11-12 NOTE — Patient Instructions (Signed)
Good to see you AC joint arthritis Ice 20 minutes 2 times daily. Usually after activity and before bed. Exercises 3 times a week.  Take tylenol 650 mg three times a day is the best evidence based medicine we have for arthritis.  Glucosamine sulfate 750mg  twice a day is a supplement that has been shown to help moderate to severe arthritis. Vitamin D 2000 IU daily Fish oil 2 grams daily.  Tumeric 500mg  twice daily.  Capsaicin topically up to four times a day may also help with pain. Try pennsaid twice daily as needed.  Come back in 3-4 weeks if still having trouble.

## 2013-11-12 NOTE — Assessment & Plan Note (Signed)
Patient was given injection today, referred to physical therapy, home exercise program, icing protocol, and topical anti-inflammatories. Patient will try these interventions and come back and see me again in 3-4 weeks. Continuing to have discomfort we may need to consider further imaging the patient's neck but hopefully this will not be necessary.  Spent greater than 25 minutes with patient face-to-face and had greater than 50% of counseling including as described above in assessment and plan.

## 2013-11-19 ENCOUNTER — Ambulatory Visit: Payer: Medicare Other | Attending: Family Medicine | Admitting: Physical Therapy

## 2013-11-19 DIAGNOSIS — IMO0001 Reserved for inherently not codable concepts without codable children: Secondary | ICD-10-CM | POA: Diagnosis not present

## 2013-11-19 DIAGNOSIS — M25619 Stiffness of unspecified shoulder, not elsewhere classified: Secondary | ICD-10-CM | POA: Diagnosis not present

## 2013-11-19 DIAGNOSIS — I10 Essential (primary) hypertension: Secondary | ICD-10-CM | POA: Diagnosis not present

## 2013-11-19 DIAGNOSIS — M899 Disorder of bone, unspecified: Secondary | ICD-10-CM | POA: Diagnosis not present

## 2013-11-19 DIAGNOSIS — M949 Disorder of cartilage, unspecified: Secondary | ICD-10-CM | POA: Diagnosis not present

## 2013-11-19 DIAGNOSIS — R5381 Other malaise: Secondary | ICD-10-CM | POA: Diagnosis not present

## 2013-11-19 DIAGNOSIS — M25519 Pain in unspecified shoulder: Secondary | ICD-10-CM | POA: Diagnosis not present

## 2013-11-20 ENCOUNTER — Ambulatory Visit: Payer: Medicare Other

## 2013-11-20 DIAGNOSIS — R5381 Other malaise: Secondary | ICD-10-CM | POA: Diagnosis not present

## 2013-11-20 DIAGNOSIS — M25619 Stiffness of unspecified shoulder, not elsewhere classified: Secondary | ICD-10-CM | POA: Diagnosis not present

## 2013-11-20 DIAGNOSIS — M949 Disorder of cartilage, unspecified: Secondary | ICD-10-CM | POA: Diagnosis not present

## 2013-11-20 DIAGNOSIS — I10 Essential (primary) hypertension: Secondary | ICD-10-CM | POA: Diagnosis not present

## 2013-11-20 DIAGNOSIS — IMO0001 Reserved for inherently not codable concepts without codable children: Secondary | ICD-10-CM | POA: Diagnosis not present

## 2013-11-20 DIAGNOSIS — M899 Disorder of bone, unspecified: Secondary | ICD-10-CM | POA: Diagnosis not present

## 2013-11-20 DIAGNOSIS — M25519 Pain in unspecified shoulder: Secondary | ICD-10-CM | POA: Diagnosis not present

## 2013-11-22 ENCOUNTER — Ambulatory Visit: Payer: Medicare Other

## 2013-11-22 DIAGNOSIS — M25619 Stiffness of unspecified shoulder, not elsewhere classified: Secondary | ICD-10-CM | POA: Diagnosis not present

## 2013-11-22 DIAGNOSIS — IMO0001 Reserved for inherently not codable concepts without codable children: Secondary | ICD-10-CM | POA: Diagnosis not present

## 2013-11-22 DIAGNOSIS — M25519 Pain in unspecified shoulder: Secondary | ICD-10-CM | POA: Diagnosis not present

## 2013-11-22 DIAGNOSIS — I10 Essential (primary) hypertension: Secondary | ICD-10-CM | POA: Diagnosis not present

## 2013-11-22 DIAGNOSIS — R5381 Other malaise: Secondary | ICD-10-CM | POA: Diagnosis not present

## 2013-11-22 DIAGNOSIS — M899 Disorder of bone, unspecified: Secondary | ICD-10-CM | POA: Diagnosis not present

## 2013-11-22 DIAGNOSIS — M949 Disorder of cartilage, unspecified: Secondary | ICD-10-CM | POA: Diagnosis not present

## 2013-11-26 ENCOUNTER — Ambulatory Visit: Payer: Medicare Other

## 2013-11-26 DIAGNOSIS — R5381 Other malaise: Secondary | ICD-10-CM | POA: Diagnosis not present

## 2013-11-26 DIAGNOSIS — M899 Disorder of bone, unspecified: Secondary | ICD-10-CM | POA: Diagnosis not present

## 2013-11-26 DIAGNOSIS — M25519 Pain in unspecified shoulder: Secondary | ICD-10-CM | POA: Diagnosis not present

## 2013-11-26 DIAGNOSIS — IMO0001 Reserved for inherently not codable concepts without codable children: Secondary | ICD-10-CM | POA: Diagnosis not present

## 2013-11-26 DIAGNOSIS — I10 Essential (primary) hypertension: Secondary | ICD-10-CM | POA: Diagnosis not present

## 2013-11-26 DIAGNOSIS — M25619 Stiffness of unspecified shoulder, not elsewhere classified: Secondary | ICD-10-CM | POA: Diagnosis not present

## 2013-11-28 ENCOUNTER — Ambulatory Visit: Payer: Medicare Other

## 2013-11-29 ENCOUNTER — Encounter: Payer: Self-pay | Admitting: Internal Medicine

## 2013-12-04 ENCOUNTER — Ambulatory Visit (INDEPENDENT_AMBULATORY_CARE_PROVIDER_SITE_OTHER): Payer: Medicare Other

## 2013-12-04 DIAGNOSIS — Z23 Encounter for immunization: Secondary | ICD-10-CM

## 2013-12-05 ENCOUNTER — Ambulatory Visit: Payer: Medicare Other

## 2013-12-06 ENCOUNTER — Telehealth: Payer: Self-pay | Admitting: Internal Medicine

## 2013-12-06 NOTE — Telephone Encounter (Signed)
1130  Friday sda ok.  Also please block the 11 15 because may alston  The 11 am appt. is post ED FU and also her recheck

## 2013-12-06 NOTE — Telephone Encounter (Signed)
Pt was referred to PT for arm,shoulder and neck pain.  Pt states she is worse and therapy is not working. Pt not sleeping at all .  Would like an appt Fri ,to reassess situation and see which way to go next. but only SD appts left.. pls advise if ok to schedule.

## 2013-12-06 NOTE — Telephone Encounter (Signed)
appt scheduled

## 2013-12-07 ENCOUNTER — Encounter: Payer: Medicare Other | Admitting: Physical Therapy

## 2013-12-07 ENCOUNTER — Encounter: Payer: Self-pay | Admitting: Internal Medicine

## 2013-12-07 ENCOUNTER — Ambulatory Visit (INDEPENDENT_AMBULATORY_CARE_PROVIDER_SITE_OTHER): Payer: Medicare Other | Admitting: Internal Medicine

## 2013-12-07 VITALS — BP 124/80 | Temp 98.6°F | Wt 134.0 lb

## 2013-12-07 DIAGNOSIS — M25519 Pain in unspecified shoulder: Secondary | ICD-10-CM | POA: Diagnosis not present

## 2013-12-07 DIAGNOSIS — M25511 Pain in right shoulder: Secondary | ICD-10-CM

## 2013-12-07 DIAGNOSIS — M542 Cervicalgia: Secondary | ICD-10-CM | POA: Diagnosis not present

## 2013-12-07 MED ORDER — PREDNISONE 10 MG PO TABS: ORAL_TABLET | ORAL | Status: DC

## 2013-12-07 NOTE — Progress Notes (Signed)
Pre visit review using our clinic review tool, if applicable. No additional management support is needed unless otherwise documented below in the visit note.  Chief Complaint  Patient presents with  . Right Shoulder Pain    Neck pain started after 4 treatments of PT.  Both sides of the neck but worse on the rt.  . Neck Pain    HPI: Patient Brianna Harper  comes in today for SDA for  problem evaluation. Shoulder  August  Right getting worse and sometims hand  Tingling   And  Called dr Tamala Julian and  Dx bursisits and helped  And then recently.  ? Ac arthritis  . So did  Ac joint did nothing and then PT for  4 sessions.  [facility]  Then got back nexk pain and across.    Up until  This am a bit better.  ? nmexct step.  Was impossible to sleep. Laying is worse on neck pain wakes up at night.  Medication limited ibuprofen. No that helpful ice is temporary . Pain to pick up.  things in right hand    Here with husband    Has had 2 night os  No sleep  Cause of pain.  2 nights.  Also urinary frequency unrelated to amount of fluid taken  Not a uti .  ocass thing   leg pain and mid chest pain at night went away no associated symptoms  pain.   Hx of el[facility]ner  back pain .  Year ago .  2010 that resolved eventually with injection  ROS: See pertinent positives and negatives per HPI. No fevers other injuries vertigo is mostly better although gets a sense of it when she moves her head fast  Past Medical History  Diagnosis Date  . Anxiety   . Hyperlipidemia   . GERD (gastroesophageal reflux disease)   . Osteopenia     dexa -2.0 hip 2008  . Rapid heart rate     hx of   . Laryngitis   . Hypertension   . Migraine     "not very often anymore; they were more menopausal" (04/16/2013)  . Depression     Family History  Problem Relation Age of Onset  . Hypertension Mother   . Migraines Mother   . Arthritis Father   . Ovarian cancer Maternal Grandmother   . Seizures Neg Hx     History   Social  History  . Marital Status: Married    Spouse Name: N/A    Number of Children: 2  . Years of Education: college BS   Occupational History  . retired    Social History Main Topics  . Smoking status: Never Smoker   . Smokeless tobacco: Never Used  . Alcohol Use: No  . Drug Use: No  . Sexual Activity: Yes   Other Topics Concern  . None   Social History Narrative   HHof 2    Non smoker   2 children  Also twins that did not survive birth   Married    CB x 2   No pets     Outpatient Encounter Prescriptions as of 12/07/2013  Medication Sig  . cholecalciferol (VITAMIN D) 1000 UNITS tablet Take 2,000 Units by mouth daily.  . meclizine (ANTIVERT) 25 MG tablet Take 1 tablet (25 mg total) by mouth 3 (three) times daily as needed for dizziness.  . Multiple Vitamin (MULTIVITAMIN WITH MINERALS) TABS tablet Take 1 tablet by mouth daily.  Marland Kitchen  omeprazole (PRILOSEC) 40 MG capsule Take 40 mg by mouth daily before breakfast.  . trimethoprim (TRIMPEX) 100 MG tablet Take 100 mg by mouth daily as needed (UTI symptoms).   . verapamil (CALAN-SR) 240 MG CR tablet Take 1 tablet (240 mg total) by mouth at bedtime.  . predniSONE (DELTASONE) 10 MG tablet Take pills per day,6,6,6,4,4,4,2,2,2,1,1,1    EXAM:  BP 124/80  Temp(Src) 98.6 F (37 C) (Oral)  Wt 134 lb (60.782 kg)  Body mass index is 21.64 kg/(m^2).  GENERAL: vitals reviewed and listed above, alert, oriented, appears well hydrated and in no acute distress she appears uncomfortable and holds her neck stiffly. Also prefers not to use her right shoulder much HEENT: atraumatic, conjunctiva  clear, no obvious abnormalities on inspection of external nose and ears  NECK: no obvious masses on inspection palpation negative point tenderness has psoriasis at the base of her scalp LUNGS: No respiratory findings normal respiration MS: Tenderness right shoulder prefers not to move full range of motion some trapezius tightness muscle strength seems normal  hard to elicit reflexes grip is normal PSYCH: pleasant and cooperative, no obvious depression or severe anxiety Lab Results  Component Value Date   WBC 8.3 09/19/2013   HGB 12.6 09/19/2013   HCT 37.4 09/19/2013   PLT 194 09/19/2013   GLUCOSE 97 09/19/2013   CHOL 202* 04/16/2013   TRIG 79 04/16/2013   HDL 69 04/16/2013   LDLDIRECT 109.8 05/01/2012   LDLCALC 117* 04/16/2013   ALT 11 04/15/2013   AST 14 04/15/2013   NA 141 09/19/2013   K 4.1 09/19/2013   CL 103 09/19/2013   CREATININE 0.83 09/19/2013   BUN 17 09/19/2013   CO2 26 09/19/2013   TSH 1.87 03/09/2013   HGBA1C 5.8 09/13/2013    ASSESSMENT AND PLAN:  Discussed the following assessment and plan:  Neck and shoulder pain - newer onset  severe and wakening at night ? if radicular  not resonsive  other measues ocurred after Pt for shoulder . trial pred  refer dr Ellene Route - Plan: Ambulatory referral to Neurosurgery  Shoulder pain, right Under treatment for right shoulder bursitis which seems to be separate than the above -Patient advised to return or notify health care team  if symptoms worsen ,persist or new concerns arise.  Patient Instructions  Consider prednisone  In case   Nerve inflammation..   And do  Referral to Dr. Ellene Route  As we discussed .     Standley Brooking. Panosh M.D.

## 2013-12-07 NOTE — Patient Instructions (Signed)
Consider prednisone  In case   Nerve inflammation..   And do  Referral to Dr. Ellene Route  As we discussed .

## 2013-12-07 NOTE — Assessment & Plan Note (Signed)
Atypical  onset  After PT and wakens from sleep   Under rx for shoulder  bursitis and arthritis   pred trial in the interim  reviewer of record had mri mra of brain in [place]  Not  Neck  Imaging

## 2013-12-13 ENCOUNTER — Ambulatory Visit: Payer: Medicare Other | Admitting: Family Medicine

## 2013-12-13 DIAGNOSIS — Z124 Encounter for screening for malignant neoplasm of cervix: Secondary | ICD-10-CM | POA: Diagnosis not present

## 2013-12-13 DIAGNOSIS — R300 Dysuria: Secondary | ICD-10-CM | POA: Diagnosis not present

## 2013-12-17 DIAGNOSIS — K5732 Diverticulitis of large intestine without perforation or abscess without bleeding: Secondary | ICD-10-CM | POA: Diagnosis not present

## 2013-12-17 DIAGNOSIS — R300 Dysuria: Secondary | ICD-10-CM | POA: Diagnosis not present

## 2013-12-17 DIAGNOSIS — N949 Unspecified condition associated with female genital organs and menstrual cycle: Secondary | ICD-10-CM | POA: Diagnosis not present

## 2013-12-18 DIAGNOSIS — R300 Dysuria: Secondary | ICD-10-CM | POA: Diagnosis not present

## 2013-12-27 DIAGNOSIS — D692 Other nonthrombocytopenic purpura: Secondary | ICD-10-CM | POA: Diagnosis not present

## 2014-01-07 ENCOUNTER — Other Ambulatory Visit: Payer: Self-pay | Admitting: Orthopaedic Surgery

## 2014-01-07 DIAGNOSIS — M25551 Pain in right hip: Secondary | ICD-10-CM | POA: Diagnosis not present

## 2014-01-07 DIAGNOSIS — M25511 Pain in right shoulder: Secondary | ICD-10-CM

## 2014-01-07 DIAGNOSIS — M7551 Bursitis of right shoulder: Secondary | ICD-10-CM | POA: Diagnosis not present

## 2014-01-13 ENCOUNTER — Ambulatory Visit
Admission: RE | Admit: 2014-01-13 | Discharge: 2014-01-13 | Disposition: A | Discharge status: Home / Self Care | Payer: Medicare Other | Source: Ambulatory Visit | Attending: Orthopaedic Surgery | Admitting: Orthopaedic Surgery

## 2014-01-13 DIAGNOSIS — M25511 Pain in right shoulder: Secondary | ICD-10-CM

## 2014-01-13 DIAGNOSIS — M7591 Shoulder lesion, unspecified, right shoulder: Secondary | ICD-10-CM | POA: Diagnosis not present

## 2014-01-13 DIAGNOSIS — M7551 Bursitis of right shoulder: Secondary | ICD-10-CM | POA: Diagnosis not present

## 2014-01-21 DIAGNOSIS — M7551 Bursitis of right shoulder: Secondary | ICD-10-CM | POA: Diagnosis not present

## 2014-01-24 ENCOUNTER — Other Ambulatory Visit: Payer: Self-pay | Admitting: Neurological Surgery

## 2014-01-24 DIAGNOSIS — I10 Essential (primary) hypertension: Secondary | ICD-10-CM | POA: Diagnosis not present

## 2014-01-24 DIAGNOSIS — M5412 Radiculopathy, cervical region: Secondary | ICD-10-CM

## 2014-01-25 ENCOUNTER — Other Ambulatory Visit: Payer: Self-pay | Admitting: Internal Medicine

## 2014-01-27 NOTE — Telephone Encounter (Signed)
Ok for 6 months 

## 2014-01-28 NOTE — Telephone Encounter (Signed)
Sent to the pharmacy by e-scribe. 

## 2014-01-30 ENCOUNTER — Ambulatory Visit
Admission: RE | Admit: 2014-01-30 | Discharge: 2014-01-30 | Disposition: A | Discharge status: Home / Self Care | Payer: Medicare Other | Source: Ambulatory Visit | Attending: Neurological Surgery | Admitting: Neurological Surgery

## 2014-01-30 DIAGNOSIS — M47812 Spondylosis without myelopathy or radiculopathy, cervical region: Secondary | ICD-10-CM | POA: Diagnosis not present

## 2014-01-30 DIAGNOSIS — M5412 Radiculopathy, cervical region: Secondary | ICD-10-CM

## 2014-02-02 ENCOUNTER — Other Ambulatory Visit: Payer: Medicare Other

## 2014-02-07 DIAGNOSIS — M542 Cervicalgia: Secondary | ICD-10-CM | POA: Diagnosis not present

## 2014-02-08 ENCOUNTER — Telehealth: Payer: Self-pay | Admitting: Internal Medicine

## 2014-02-08 NOTE — Telephone Encounter (Signed)
  Take blood pressure readings twice a day for 7- 10 days and then periodically .   Marland KitchenSend in readings   ...  And or make appt with [place]   bp has been good in the office otherwise  BP Readings from Last 3 Encounters:  12/07/13 124/80  11/12/13 122/78  09/20/13 140/80

## 2014-02-08 NOTE — Telephone Encounter (Signed)
Pt has been seeing dr elsner this week for back and neck pain.  They report pt's bp is around 183/88.  They advised pt to let pt's pcp know. Pt would like you to know she is under a lot of neck and back pain associated w/ this high bp.

## 2014-02-12 NOTE — Telephone Encounter (Signed)
Pt notified to take bp reading twice daily for 7-10 days and send in reading through [place]. Will then work out a follow up visit.

## 2014-02-14 DIAGNOSIS — M5481 Occipital neuralgia: Secondary | ICD-10-CM | POA: Diagnosis not present

## 2014-02-14 DIAGNOSIS — I10 Essential (primary) hypertension: Secondary | ICD-10-CM | POA: Diagnosis not present

## 2014-03-06 ENCOUNTER — Telehealth: Payer: Self-pay | Admitting: Internal Medicine

## 2014-03-06 NOTE — Telephone Encounter (Signed)
Pt called to say that she dropped off her bp readings on 03/04/14. She called today and I did advise her that Dr Regis Bill is out of the office till next week. She asking if she can get an appt next week. Will it be all right to use any same day

## 2014-03-06 NOTE — Telephone Encounter (Signed)
Her readings are high.  Please make an appt with the pt.  Thanks!

## 2014-03-07 NOTE — Telephone Encounter (Signed)
Pt has been sch for 12/14

## 2014-03-07 NOTE — Telephone Encounter (Signed)
lmovm to call back and schedule an appt

## 2014-03-11 ENCOUNTER — Encounter: Payer: Self-pay | Admitting: Internal Medicine

## 2014-03-11 ENCOUNTER — Ambulatory Visit (INDEPENDENT_AMBULATORY_CARE_PROVIDER_SITE_OTHER): Payer: Medicare Other | Admitting: Internal Medicine

## 2014-03-11 VITALS — BP 180/98 | Temp 97.9°F | Ht 66.0 in | Wt 135.9 lb

## 2014-03-11 DIAGNOSIS — R51 Headache: Secondary | ICD-10-CM

## 2014-03-11 DIAGNOSIS — I10 Essential (primary) hypertension: Secondary | ICD-10-CM | POA: Diagnosis not present

## 2014-03-11 DIAGNOSIS — R519 Headache, unspecified: Secondary | ICD-10-CM

## 2014-03-11 LAB — CBC WITH DIFFERENTIAL/PLATELET
BASOS ABS: 0 10*3/uL (ref 0.0–0.1)
Basophils Relative: 0.7 % (ref 0.0–3.0)
Eosinophils Absolute: 0 10*3/uL (ref 0.0–0.7)
Eosinophils Relative: 0.5 % (ref 0.0–5.0)
HEMATOCRIT: 42.9 % (ref 36.0–46.0)
Hemoglobin: 14.1 g/dL (ref 12.0–15.0)
LYMPHS ABS: 1.2 10*3/uL (ref 0.7–4.0)
Lymphocytes Relative: 21 % (ref 12.0–46.0)
MCHC: 32.9 g/dL (ref 30.0–36.0)
MCV: 95.4 fl (ref 78.0–100.0)
MONOS PCT: 7.2 % (ref 3.0–12.0)
Monocytes Absolute: 0.4 10*3/uL (ref 0.1–1.0)
NEUTROS PCT: 70.6 % (ref 43.0–77.0)
Neutro Abs: 4.1 10*3/uL (ref 1.4–7.7)
PLATELETS: 246 10*3/uL (ref 150.0–400.0)
RBC: 4.49 Mil/uL (ref 3.87–5.11)
RDW: 14 % (ref 11.5–15.5)
WBC: 5.9 10*3/uL (ref 4.0–10.5)

## 2014-03-11 LAB — POCT URINALYSIS DIP (MANUAL ENTRY)
BILIRUBIN UA: NEGATIVE
Bilirubin, UA: NEGATIVE
Blood, UA: NEGATIVE
Glucose, UA: NEGATIVE
Nitrite, UA: NEGATIVE
PH UA: 5.5
Protein Ur, POC: NEGATIVE
Spec Grav, UA: <=1.005
Urobilinogen, UA: 0.2

## 2014-03-11 LAB — BASIC METABOLIC PANEL
BUN: 15 mg/dL (ref 6–23)
CALCIUM: 9.5 mg/dL (ref 8.4–10.5)
CHLORIDE: 104 meq/L (ref 96–112)
CO2: 27 mEq/L (ref 19–32)
CREATININE: 0.8 mg/dL (ref 0.4–1.2)
GFR: 74.93 mL/min (ref >60.00)
Glucose, Bld: 96 mg/dL (ref 70–99)
Potassium: 4.3 mEq/L (ref 3.5–5.1)
Sodium: 138 mEq/L (ref 135–145)

## 2014-03-11 LAB — SEDIMENTATION RATE: SED RATE: 3 mm/h (ref 0–22)

## 2014-03-11 MED ORDER — VERAPAMIL HCL ER 240 MG PO CP24: 240.0000 mg | ORAL_CAPSULE | Freq: Every day | ORAL | Status: DC

## 2014-03-11 MED ORDER — LISINOPRIL 10 MG PO TABS: 10.0000 mg | ORAL_TABLET | Freq: Every day | ORAL | Status: DC

## 2014-03-11 NOTE — Progress Notes (Signed)
Pre visit review using our clinic review tool, if applicable. No additional management support is needed unless otherwise documented below in the visit note.  Chief Complaint  Patient presents with  . Follow-up    BP elevation    HPI: Brianna Harper 68 y.o.  come sin with increasing bp readings noted over the past few months   bp readings have been up.  Still managing shoulder and neck pain   Mri  Neck and shoulder injection helped .  At first thought frmo pain but on going after rx.  Readings    In the 158 160 range  .   Has list of readings  In nov dec 322-025 diastolic 60 - 42H  BP : was up at  The office.  Not just pain at this time.  Family hx  Of ht.  Some  advil aleve every other day .  For dull headache . Sleep   Getting pounding when lays down . Noted on 12 hours  Verapamil 240 er and not 24  .   Has mild ha  Had occipital nerve injection in past  No recent vertigo husband has ? About gta dx and bp and sx.  ROS: See pertinent positives and negatives per HPI. No cp sob neuro sx  New neuro sx   Past Medical History  Diagnosis Date  . Anxiety   . Hyperlipidemia   . GERD (gastroesophageal reflux disease)   . Osteopenia     dexa -2.0 hip 2008  . Rapid heart rate     hx of   . Laryngitis   . Hypertension   . Migraine     "not very often anymore; they were more menopausal" (04/16/2013)  . Depression     Family History  Problem Relation Age of Onset  . Hypertension Mother   . Migraines Mother   . Arthritis Father   . Ovarian cancer Maternal Grandmother   . Seizures Neg Hx     History   Social History  . Marital Status: Married    Spouse Name: N/A    Number of Children: 2  . Years of Education: college BS   Occupational History  . retired    Social History Main Topics  . Smoking status: Never Smoker   . Smokeless tobacco: Never Used  . Alcohol Use: No  . Drug Use: No  . Sexual Activity: Yes   Other Topics Concern  . None   Social History  Narrative   HHof 2    Non smoker   2 children  Also twins that did not survive birth   Married    CB x 2   No pets     Outpatient Encounter Prescriptions as of 03/11/2014  Medication Sig  . cholecalciferol (VITAMIN D) 1000 UNITS tablet Take 2,000 Units by mouth daily.  . meclizine (ANTIVERT) 25 MG tablet Take 1 tablet (25 mg total) by mouth 3 (three) times daily as needed for dizziness.  . Multiple Vitamin (MULTIVITAMIN WITH MINERALS) TABS tablet Take 1 tablet by mouth daily.  Marland Kitchen omeprazole (PRILOSEC) 40 MG capsule Take 40 mg by mouth daily before breakfast.  . trimethoprim (TRIMPEX) 100 MG tablet Take 100 mg by mouth daily as needed (UTI symptoms).   . verapamil (CALAN-SR) 240 MG CR tablet TAKE 1 TABLET AT BEDTIME  . lisinopril (PRINIVIL,ZESTRIL) 10 MG tablet Take 1 tablet (10 mg total) by mouth daily. May increase to 20 mg per day as directed  . verapamil (  VERELAN PM) 240 MG 24 hr capsule Take 1 capsule (240 mg total) by mouth at bedtime.  . [DISCONTINUED] predniSONE (DELTASONE) 10 MG tablet Take pills per day,6,6,6,4,4,4,2,2,2,1,1,1    EXAM:  BP 180/98 mmHg  Temp(Src) 97.9 F (36.6 C) (Oral)  Ht 5\' 6"  (1.676 m)  Wt 135 lb 14.4 oz (61.644 kg)  BMI 21.95 kg/m2  Body mass index is 21.95 kg/(m^2). Pulses = bp 180/98 and 178/96    own machine in 170 range  correlates GENERAL: vitals reviewed and listed above, alert, oriented, appears well hydrated and in no acute distress HEENT: atraumatic, conjunctiva  clear, no obvious abnormalities on inspection of external nose and earsLUNGS: clear to auscultation bilaterally, no wheezes, rales or rhonchi, good air movement CV: HRRR, no clubbing cyanosis or  peripheral edema nl cap refill  MS: moves all extremities without noticeable focal  abnormality PSYCH: pleasant and cooperative, no obvious depression or anxiety BP Readings from Last 3 Encounters:  03/11/14 180/98  12/07/13 124/80  11/12/13 122/78   Lab Results  Component Value Date     WBC 5.9 03/11/2014   HGB 14.1 03/11/2014   HCT 42.9 03/11/2014   PLT 246.0 03/11/2014   GLUCOSE 96 03/11/2014   CHOL 202* 04/16/2013   TRIG 79 04/16/2013   HDL 69 04/16/2013   LDLDIRECT 109.8 05/01/2012   LDLCALC 117* 04/16/2013   ALT 11 04/15/2013   AST 14 04/15/2013   NA 138 03/11/2014   K 4.3 03/11/2014   CL 104 03/11/2014   CREATININE 0.8 03/11/2014   BUN 15 03/11/2014   CO2 27 03/11/2014   TSH 1.87 03/09/2013   HGBA1C 5.8 09/13/2013   teva  Tab er 12 hour  M 411  [place] .   Blue. veraqpamil   ASSESSMENT AND PLAN:  Discussed the following assessment and plan:  Essential hypertension - significanelevation uncertain trigger   pt ans i not aware of 12 hour  med vs 24 hours cr may not be related  - Plan: Basic metabolic panel, POCT urinalysis dipstick, CBC with Differential, Sedimentation rate  Nonintractable episodic headache, unspecified headache type - minimal ha  non focal    other neck shoulder pain better mostly no recnet nsaids no nfocal exam today - Plan: Basic metabolic panel, POCT urinalysis dipstick, CBC with Differential, Sedimentation rate  close fu advised  Add acei 10 inc to 20 mg if needed  And fuin 2-3 weeks  Home machine correlates Consider increase the verapamil or adding diuretic   Uncertain  Cause  Of elevation that is so hight  consider check secondary causes if resistant   -Patient advised to return or notify health care team  if symptoms worsen ,persist or new concerns arise.  Patient Instructions  Avoid  antiinflammatories  at this point.  Uncertain  Why a 12 hours  Verapamil.   Will have o call about this .  Tabs are scored  Sen t in local capsulle 24 hours  Also lisinopril 10 mg 1 poer day if after 7 days not decreasing  To below 147 systolic then can increase to 20 mg per day  Will notify you  of labs when available.  ROV in about 2-3 weeks or as needed in the interim.     Standley Brooking. Brianna Harper M.D.  Total visit 30 mins > 50% spent counseling  and coordinating care

## 2014-03-11 NOTE — Patient Instructions (Addendum)
Avoid  antiinflammatories  at this point.  Uncertain  Why a 12 hours  Verapamil.   Will have o call about this .  Tabs are scored  Sen t in local capsulle 24 hours  Also lisinopril 10 mg 1 poer day if after 7 days not decreasing  To below 528 systolic then can increase to 20 mg per day  Will notify you  of labs when available.  ROV in about 2-3 weeks or as needed in the interim.

## 2014-03-14 ENCOUNTER — Observation Stay (HOSPITAL_COMMUNITY)
Admission: EM | Admit: 2014-03-14 | Discharge: 2014-03-15 | Disposition: A | Discharge status: Home / Self Care | Payer: Medicare Other | Attending: Internal Medicine | Admitting: Internal Medicine

## 2014-03-14 ENCOUNTER — Encounter (HOSPITAL_COMMUNITY): Payer: Self-pay | Admitting: Emergency Medicine

## 2014-03-14 ENCOUNTER — Emergency Department (HOSPITAL_COMMUNITY): Payer: Medicare Other

## 2014-03-14 DIAGNOSIS — R079 Chest pain, unspecified: Secondary | ICD-10-CM | POA: Diagnosis present

## 2014-03-14 DIAGNOSIS — R000 Tachycardia, unspecified: Secondary | ICD-10-CM

## 2014-03-14 DIAGNOSIS — R0689 Other abnormalities of breathing: Secondary | ICD-10-CM | POA: Diagnosis not present

## 2014-03-14 DIAGNOSIS — I10 Essential (primary) hypertension: Secondary | ICD-10-CM | POA: Diagnosis not present

## 2014-03-14 DIAGNOSIS — G43909 Migraine, unspecified, not intractable, without status migrainosus: Secondary | ICD-10-CM | POA: Diagnosis not present

## 2014-03-14 DIAGNOSIS — R0789 Other chest pain: Principal | ICD-10-CM | POA: Insufficient documentation

## 2014-03-14 DIAGNOSIS — K219 Gastro-esophageal reflux disease without esophagitis: Secondary | ICD-10-CM | POA: Insufficient documentation

## 2014-03-14 DIAGNOSIS — F419 Anxiety disorder, unspecified: Secondary | ICD-10-CM | POA: Diagnosis not present

## 2014-03-14 DIAGNOSIS — R0602 Shortness of breath: Secondary | ICD-10-CM

## 2014-03-14 DIAGNOSIS — J40 Bronchitis, not specified as acute or chronic: Secondary | ICD-10-CM | POA: Diagnosis not present

## 2014-03-14 LAB — BASIC METABOLIC PANEL
Anion gap: 12 (ref 5–15)
BUN: 21 mg/dL (ref 6–23)
CHLORIDE: 106 meq/L (ref 96–112)
CO2: 26 mEq/L (ref 19–32)
Calcium: 9.8 mg/dL (ref 8.4–10.5)
Creatinine, Ser: 1 mg/dL (ref 0.50–1.10)
GFR calc non Af Amer: 57 mL/min — ABNORMAL LOW (ref >90)
GFR, EST AFRICAN AMERICAN: 66 mL/min — AB (ref >90)
Glucose, Bld: 95 mg/dL (ref 70–99)
POTASSIUM: 4.1 meq/L (ref 3.7–5.3)
SODIUM: 144 meq/L (ref 137–147)

## 2014-03-14 LAB — CBC
HCT: 38.8 % (ref 36.0–46.0)
HEMOGLOBIN: 13.1 g/dL (ref 12.0–15.0)
MCH: 31.3 pg (ref 26.0–34.0)
MCHC: 33.8 g/dL (ref 30.0–36.0)
MCV: 92.6 fL (ref 78.0–100.0)
Platelets: 217 10*3/uL (ref 150–400)
RBC: 4.19 MIL/uL (ref 3.87–5.11)
RDW: 13.2 % (ref 11.5–15.5)
WBC: 5.6 10*3/uL (ref 4.0–10.5)

## 2014-03-14 LAB — PRO B NATRIURETIC PEPTIDE: PRO B NATRI PEPTIDE: 110.9 pg/mL (ref 0–125)

## 2014-03-14 LAB — I-STAT TROPONIN, ED: TROPONIN I, POC: 0.01 ng/mL (ref 0.00–0.08)

## 2014-03-14 MED ORDER — LORAZEPAM 2 MG/ML IJ SOLN
0.5000 mg | Freq: Once | INTRAMUSCULAR | Status: AC
  Administered 2014-03-14: 0.5 mg via INTRAVENOUS
  Filled 2014-03-14: qty 1

## 2014-03-14 MED ORDER — NITROGLYCERIN 0.4 MG SL SUBL
0.4000 mg | SUBLINGUAL_TABLET | SUBLINGUAL | Status: DC | PRN
  Administered 2014-03-14: 0.4 mg via SUBLINGUAL
  Filled 2014-03-14: qty 1

## 2014-03-14 MED ORDER — ASPIRIN 81 MG PO CHEW
324.0000 mg | CHEWABLE_TABLET | Freq: Once | ORAL | Status: AC
  Administered 2014-03-14: 324 mg via ORAL
  Filled 2014-03-14: qty 4

## 2014-03-14 NOTE — ED Provider Notes (Signed)
CSN: [id number]     Arrival date & time 03/14/14  2036 History   First MD Initiated Contact with Patient 03/14/14 2051     Chief Complaint  Patient presents with  . Chest Pain     (Consider location/radiation/quality/duration/timing/severity/associated sxs/prior Treatment) HPI Comments: Patient presents to the ER for evaluation of elevated blood pressure. Patient reports that her blood pressure has been running high for several weeks. Earlier this week to assure increased and she saw her primary care doctor. She has been taking verapamil for her blood pressure, had lisinopril added. She has been taking that for the last 4 days, but pressure has not come down. Today blood pressure markedly increased above 409 systolic. She has been experiencing a headache today and has noticed a heaviness over her chest for the last couple of hours. She also complains of pain on the left side of her neck. There is no associated shortness of breath. No blurred vision. No numbness, tingling or weakness of extremities.  Patient is a 68 y.o. female presenting with chest pain.  Chest Pain Associated symptoms: headache     Past Medical History  Diagnosis Date  . Anxiety   . Hyperlipidemia   . GERD (gastroesophageal reflux disease)   . Osteopenia     dexa -2.0 hip 2008  . Rapid heart rate     hx of   . Laryngitis   . Hypertension   . Migraine     "not very often anymore; they were more menopausal" (04/16/2013)  . Depression    Past Surgical History  Procedure Laterality Date  . Colonoscopy  02/03/2004    internal hemorrhoids  . Upper gastrointestinal endoscopy  08/29/2008    gastritis, hiatal hernia   Family History  Problem Relation Age of Onset  . Hypertension Mother   . Migraines Mother   . Arthritis Father   . Ovarian cancer Maternal Grandmother   . Seizures Neg Hx    History  Substance Use Topics  . Smoking status: Never Smoker   . Smokeless tobacco: Never Used  . Alcohol Use: No   OB  History    No data available     Review of Systems  Cardiovascular: Positive for chest pain.  Musculoskeletal: Positive for neck pain.  Neurological: Positive for headaches.  All other systems reviewed and are negative.     Allergies  Erythromycin ethylsuccinate; Nitrofurantoin; and Sulfadiazine  Home Medications   Prior to Admission medications   Medication Sig Start Date End Date Taking? Authorizing Provider  cholecalciferol (VITAMIN D) 1000 UNITS tablet Take 2,000 Units by mouth daily.    Historical Provider, MD  lisinopril (PRINIVIL,ZESTRIL) 10 MG tablet Take 1 tablet (10 mg total) by mouth daily. May increase to 20 mg per day as directed 03/11/14   Burnis Medin, MD  meclizine (ANTIVERT) 25 MG tablet Take 1 tablet (25 mg total) by mouth 3 (three) times daily as needed for dizziness. 09/19/13   Orpah Greek, MD  Multiple Vitamin (MULTIVITAMIN WITH MINERALS) TABS tablet Take 1 tablet by mouth daily.    Historical Provider, MD  omeprazole (PRILOSEC) 40 MG capsule Take 40 mg by mouth daily before breakfast.    Historical Provider, MD  trimethoprim (TRIMPEX) 100 MG tablet Take 100 mg by mouth daily as needed (UTI symptoms).     Historical Provider, MD  verapamil (CALAN-SR) 240 MG CR tablet TAKE 1 TABLET AT BEDTIME 01/28/14   Burnis Medin, MD  verapamil (VERELAN PM) 240 MG  24 hr capsule Take 1 capsule (240 mg total) by mouth at bedtime. 03/11/14   Burnis Medin, MD   BP 171/77 mmHg  Pulse 85  Temp(Src) 98.4 F (36.9 C)  Resp 13  Ht 5\' 6"  (1.676 m)  Wt 135 lb (61.236 kg)  BMI 21.80 kg/m2  SpO2 99% Physical Exam  Constitutional: She is oriented to person, place, and time. She appears well-developed and well-nourished. No distress.  HENT:  Head: Normocephalic and atraumatic.  Right Ear: Hearing normal.  Left Ear: Hearing normal.  Nose: Nose normal.  Mouth/Throat: Oropharynx is clear and moist and mucous membranes are normal.  Eyes: Conjunctivae and EOM are  normal. Pupils are equal, round, and reactive to light.  Neck: Normal range of motion. Neck supple.  Cardiovascular: Regular rhythm, S1 normal and S2 normal.  Exam reveals no gallop and no friction rub.   No murmur heard. Pulmonary/Chest: Effort normal and breath sounds normal. No respiratory distress. She exhibits no tenderness.  Abdominal: Soft. Normal appearance and bowel sounds are normal. There is no hepatosplenomegaly. There is no tenderness. There is no rebound, no guarding, no tenderness at McBurney's point and negative Murphy's sign. No hernia.  Musculoskeletal: Normal range of motion.  Neurological: She is alert and oriented to person, place, and time. She has normal strength. No cranial nerve deficit or sensory deficit. Coordination normal. GCS eye subscore is 4. GCS verbal subscore is 5. GCS motor subscore is 6.  Skin: Skin is warm, dry and intact. No rash noted. No cyanosis.  Psychiatric: She has a normal mood and affect. Her speech is normal and behavior is normal. Thought content normal.  Nursing note and vitals reviewed.   ED Course  Procedures (including critical care time) Labs Review Labs Reviewed  BASIC METABOLIC PANEL - Abnormal; Notable for the following:    GFR calc non Af Amer 57 (*)    GFR calc Af Amer 66 (*)    All other components within normal limits  CBC  PRO B NATRIURETIC PEPTIDE  I-STAT TROPOININ, ED    Imaging Review Dg Chest Portable 1 View  03/14/2014   CLINICAL DATA:  Chest pain, difficulty breathing.  EXAM: PORTABLE CHEST - 1 VIEW  COMPARISON:  Chest radiograph November 20, 2012  FINDINGS: Stable to increasing mild bronchitic changes, no pleural effusion or focal consolidation. No pneumothorax. Cardiomediastinal silhouette is unremarkable. Soft tissue planes and included osseous structures are nonsuspicious.  IMPRESSION: Similar to slightly increasing mild bronchitic changes without focal consolidation.   Electronically Signed   By: Elon Alas    On: 03/14/2014 21:36     EKG Interpretation   Date/Time:  Thursday March 14 2014 20:42:47 EST Ventricular Rate:  96 PR Interval:  154 QRS Duration: 84 QT Interval:  340 QTC Calculation: 429 R Axis:   81 Text Interpretation:  Normal sinus rhythm Normal ECG Confirmed by Karcyn Menn   MD, Fransico Sciandra ([id number]) on 03/14/2014 8:51:05 PM      MDM   Final diagnoses:  Tachycardia   chest pain UnControlled hypertension  Patient presents to the ER for evaluation of elevated blood pressure. Patient reports that she has been experiencing elevated blood pressure the last couple of months. She recently saw her doctor for this and had blood pressure medications adjusted but it did not help. Patient had significant elevation of her blood pressure this morning, much that she has had in the past. She had associated headache and chest pain with elevated blood pressure.  At arrival to  the ER, patient's EKG is normal. Blood work has been unremarkable. She did have markedly elevated blood pressures and was administered aspirin and nitroglycerin. After his lingual nitroglycerin, patient became extremely uncomfortable. She started to feel like she was going to pass out became extremely tachycardic. Heart rate jumped up to approximately 150, appeared to be a sinus tachycardia. This did slowly come down over the next few minutes and completely resolved after Ativan. This did not appear to be a supraventricular tachycardia, atrial fibrillation/flutter. She can continue to be monitored in the hospital. She will require hospitalization for cardiac rule out as well as adjustment of her blood pressure medications.    Orpah Greek, MD 03/14/14 2259

## 2014-03-14 NOTE — ED Notes (Addendum)
Pt called out reports "my heart is racing"- this RN at bedside- HR 154bpm.  Dr. Betsey Holiday called to bedside- 2nd EKG captured and signed by EDP.

## 2014-03-14 NOTE — ED Notes (Signed)
Dr. Pollina at bedside   

## 2014-03-14 NOTE — ED Notes (Signed)
Central chest pain today with neck pain and HA, HTN, reports nausea and SOB, A/OX4, ambulatory and in NAD

## 2014-03-15 ENCOUNTER — Encounter (HOSPITAL_COMMUNITY): Payer: Self-pay

## 2014-03-15 ENCOUNTER — Telehealth: Payer: Self-pay

## 2014-03-15 DIAGNOSIS — I10 Essential (primary) hypertension: Secondary | ICD-10-CM

## 2014-03-15 DIAGNOSIS — R0789 Other chest pain: Secondary | ICD-10-CM

## 2014-03-15 DIAGNOSIS — K219 Gastro-esophageal reflux disease without esophagitis: Secondary | ICD-10-CM | POA: Diagnosis not present

## 2014-03-15 DIAGNOSIS — R072 Precordial pain: Secondary | ICD-10-CM | POA: Diagnosis not present

## 2014-03-15 DIAGNOSIS — R079 Chest pain, unspecified: Secondary | ICD-10-CM

## 2014-03-15 LAB — TROPONIN I
Troponin I: <0.3 ng/mL (ref <0.30)
Troponin I: <0.3 ng/mL (ref <0.30)
Troponin I: <0.3 ng/mL (ref <0.30)

## 2014-03-15 MED ORDER — MECLIZINE HCL 25 MG PO TABS
25.0000 mg | ORAL_TABLET | Freq: Three times a day (TID) | ORAL | Status: DC | PRN
  Filled 2014-03-15: qty 1

## 2014-03-15 MED ORDER — ONDANSETRON HCL 4 MG/2ML IJ SOLN: 4.0000 mg | Freq: Four times a day (QID) | INTRAMUSCULAR | Status: DC | PRN

## 2014-03-15 MED ORDER — PANTOPRAZOLE SODIUM 40 MG PO TBEC
40.0000 mg | DELAYED_RELEASE_TABLET | Freq: Every day | ORAL | Status: DC
  Administered 2014-03-15: 40 mg via ORAL
  Filled 2014-03-15: qty 1

## 2014-03-15 MED ORDER — ACETAMINOPHEN 325 MG PO TABS
650.0000 mg | ORAL_TABLET | ORAL | Status: DC | PRN
  Filled 2014-03-15 (×2): qty 2

## 2014-03-15 MED ORDER — VERAPAMIL HCL ER 240 MG PO TBCR
240.0000 mg | EXTENDED_RELEASE_TABLET | Freq: Every day | ORAL | Status: DC
  Filled 2014-03-15 (×2): qty 1

## 2014-03-15 MED ORDER — BUTALBITAL-APAP-CAFFEINE 50-325-40 MG PO TABS: 1.0000 | ORAL_TABLET | Freq: Four times a day (QID) | ORAL | Status: DC | PRN

## 2014-03-15 MED ORDER — BUTALBITAL-APAP-CAFFEINE 50-325-40 MG PO TABS
1.0000 | ORAL_TABLET | Freq: Four times a day (QID) | ORAL | Status: DC | PRN
  Administered 2014-03-15 (×2): 1 via ORAL
  Filled 2014-03-15 (×3): qty 1

## 2014-03-15 MED ORDER — ENOXAPARIN SODIUM 40 MG/0.4ML ~~LOC~~ SOLN
40.0000 mg | SUBCUTANEOUS | Status: DC
  Administered 2014-03-15: 40 mg via SUBCUTANEOUS
  Filled 2014-03-15: qty 0.4

## 2014-03-15 MED ORDER — ALPRAZOLAM 0.25 MG PO TABS: 0.2500 mg | ORAL_TABLET | Freq: Two times a day (BID) | ORAL | Status: DC | PRN

## 2014-03-15 MED ORDER — HYDROCHLOROTHIAZIDE 12.5 MG PO CAPS: 12.5000 mg | ORAL_CAPSULE | Freq: Every day | ORAL | Status: DC

## 2014-03-15 MED ORDER — HYDROCHLOROTHIAZIDE 12.5 MG PO CAPS
12.5000 mg | ORAL_CAPSULE | Freq: Every day | ORAL | Status: DC
  Administered 2014-03-15: 12.5 mg via ORAL
  Filled 2014-03-15: qty 1

## 2014-03-15 MED ORDER — LISINOPRIL 10 MG PO TABS
10.0000 mg | ORAL_TABLET | Freq: Every day | ORAL | Status: DC
  Administered 2014-03-15: 10 mg via ORAL
  Filled 2014-03-15: qty 1

## 2014-03-15 MED ORDER — LISINOPRIL 5 MG PO TABS: 5.0000 mg | ORAL_TABLET | Freq: Every day | ORAL | Status: DC

## 2014-03-15 MED ORDER — ACETAMINOPHEN 325 MG PO TABS
650.0000 mg | ORAL_TABLET | Freq: Four times a day (QID) | ORAL | Status: DC | PRN
  Administered 2014-03-15: 650 mg via ORAL

## 2014-03-15 NOTE — Telephone Encounter (Signed)
[facility] Night - Client [facility] Patient Name: Brianna Harper Gender: Female DOB: 1945/05/25 Age: 68 Y 80 M 22 D Return Phone Number: [phone] (Primary), [phone] (Secondary) Address: [address] City/State/Zip: [place] [address] Client [facility] Night - Client Client Site [facility] - Night Physician Shanon Ace Contact Type Call Call Type Triage / Clinical Caller Name Arisa Congleton Relationship To Patient Spouse Return Phone Number [phone] (Primary) Chief Complaint BLOOD PRESSURE HIGH - Systolic (top number) [phone] or greater (with symptoms) Initial Comment Caller states that his wife has been having high blood pressure readings for the past week. She is taking meds, but the readings aren't going down. She is at 212/112. PreDisposition InappropriateToAsk Nurse Assessment Nurse: Martyn Ehrich, RN, Felicia Date/Time (Eastern Time): 03/14/2014 8:00:43 PM Confirm and document reason for call. If symptomatic, describe symptoms. ---Pt has Chest discomfort and SOB. 186/112 Has the patient traveled out of the country within the last 30 days? ---No Does the patient require triage? ---Yes Related visit to physician within the last 2 weeks? ---Yes Does the PT have any chronic conditions? (i.e. diabetes, asthma, etc.) ---Yes List chronic conditions. ---HTN Guidelines Guideline Title Affirmed Question Affirmed Notes Nurse Date/Time Eilene Ghazi Time) Chest Pain [1] Chest pain lasts > 5 minutes AND [2] described as crushing, pressure-like, or heavy Martyn Ehrich, RN, Felicia 31/49/7026 3:78:58 PM Disp. Time Eilene Ghazi Time) Disposition Final User 03/14/2014 7:57:30 PM Send to Urgent Rebekah Chesterfield 03/14/2014 8:09:31 PM Send To RN Personal Martyn Ehrich, RN, Felicia 85/04/7739 2:87:86 PM 911 Follow Up Call Attempted Martyn Ehrich, RN, Felicia Reason: no answer 03/14/2014  8:27:21 PM Call Completed Martyn Ehrich, RN, Solmon Ice PLEASE NOTE: All timestamps contained within this report are represented as [place] Standard Time. CONFIDENTIALTY NOTICE: This fax transmission is intended only for the addressee. It contains information that is legally privileged, confidential or otherwise protected from use or disclosure. If you are not the intended recipient, you are strictly prohibited from reviewing, disclosing, copying using or disseminating any of this information or taking any action in reliance on or regarding this information. If you have received this fax in error, please notify us immediately by telephone so that we can arrange for its return to [place]. Phone: [phone], Toll-Free: [phone], Fax: [phone] Page: 2 of 2 Call Id: [id number] 03/14/2014 8:09:20 PM Call EMS 911 Now Yes Martyn Ehrich, RN, Alphia Kava Understands: Yes Disagree/Comply: Comply Care Advice Given Per Guideline CALL EMS 911 NOW: Immediate medical attention is needed. You need to hang up and call 911 (or an ambulance). Psychologist, forensic Discretion: I'll call you back in a few minutes to be sure you were able to reach them.) After Care Instructions Given Call Event Type User Date / Time Description  Pt is currently admitted.

## 2014-03-15 NOTE — Progress Notes (Signed)
UR completed 

## 2014-03-15 NOTE — Discharge Summary (Signed)
Physician Discharge Summary  Brianna Harper:[id number] DOB: [birth year] DOA: 03/14/2014  PCP: Lottie Dawson, MD  Admit date: 03/14/2014 Discharge date: 03/15/2014  Time spent: 35 minutes  Recommendations for Outpatient Follow-up:  1. Follow up with Cardiology in 2 weeks. B-me tin 2 weeks. 2. Stress test as an outpatient.  Discharge Diagnoses:  Principal Problem:   Chest pain Active Problems:   Migraine   Hypertension   Acid reflux   Discharge Condition: stable  Diet recommendation: heart healthy  Filed Weights   03/14/14 2041 03/15/14 0030  Weight: 61.236 kg (135 lb) 60.6 kg (133 lb 9.6 oz)    History of present illness:  68 y.o. female with Past medical history of anxiety, GERD, migraine, hypertension. The patient presented with complaints of chest heaviness as well as palpitation. She mentions that she has history of high blood pressure and has been following up with his PCP and regular basis for the same. Since last few days she has noticed that her blood pressure has been consistently high and was seen by her PCP at added lisinopril which she started taking 4 days ago. Despite taking lisinopril her blood pressure has remained on elevated and today she started having complaints of palpitation. For a movement she also had some chest heaviness which is located on the left side. With that she was concerned and therefore she decided to come to the hospital.  Hospital Course:  Atypical Chest pain in the setting of elevated Blood pressure: - Now resolved, cardiac markers negative. - EKG no TWI. - Stress test as an outpatient.  Acid reflux - PPI.  Hypertensive urgency: - started on lisinopril, d/c verapamil. - start HCTZ. - cont to monitor BP. Bp improved. - will need to follow up with cardiology.  Procedures:  Echo 12.18.2015: estimated ejection fraction was in the range of 55% to 60%. Wall motion was normal; there were no regional wall motion  abnormalities. Doppler parameters are consistent with abnormal left ventricular relaxation (grade 1 diastolic dysfunction).  Consultations:  none  Discharge Exam: Filed Vitals:   03/15/14 1421  BP: 138/71  Pulse: 88  Temp: 98.7 F (37.1 C)  Resp: 18    General: A&O x3 Cardiovascular: RRR Respiratory: good air movement CTA B/L  Discharge Instructions You were cared for by a hospitalist during your hospital stay. If you have any questions about your discharge medications or the care you received while you were in the hospital after you are discharged, you can call the unit and asked to speak with the hospitalist on call if the hospitalist that took care of you is not available. Once you are discharged, your primary care physician will handle any further medical issues. Please note that NO REFILLS for any discharge medications will be authorized once you are discharged, as it is imperative that you return to your primary care physician (or establish a relationship with a primary care physician if you do not have one) for your aftercare needs so that they can reassess your need for medications and monitor your lab values.  Discharge Instructions    Diet - low sodium heart healthy    Complete by:  As directed      Increase activity slowly    Complete by:  As directed           Current Discharge Medication List    START taking these medications   Details  hydrochlorothiazide (MICROZIDE) 12.5 MG capsule Take 1 capsule (12.5 mg total) by mouth daily. Qty:  30 capsule, Refills: 0      CONTINUE these medications which have CHANGED   Details  lisinopril (PRINIVIL,ZESTRIL) 5 MG tablet Take 1 tablet (5 mg total) by mouth daily. Qty: 30 tablet, Refills: 0      CONTINUE these medications which have NOT CHANGED   Details  cholecalciferol (VITAMIN D) 1000 UNITS tablet Take 2,000 Units by mouth daily.    Multiple Vitamin (MULTIVITAMIN WITH MINERALS) TABS tablet Take 1 tablet by  mouth daily.    omeprazole (PRILOSEC) 40 MG capsule Take 40 mg by mouth daily before breakfast.    verapamil (VERELAN PM) 240 MG 24 hr capsule Take 1 capsule (240 mg total) by mouth at bedtime. Qty: 30 capsule, Refills: 3      STOP taking these medications     meclizine (ANTIVERT) 25 MG tablet        Allergies  Allergen Reactions  . Erythromycin Ethylsuccinate Other (See Comments)    REACTION: GI upset  . Nitrofurantoin Hives and Swelling  . Sulfadiazine Itching and Rash      The results of significant diagnostics from this hospitalization (including imaging, microbiology, ancillary and laboratory) are listed below for reference.    Significant Diagnostic Studies: Dg Chest Portable 1 View  03/14/2014   CLINICAL DATA:  Chest pain, difficulty breathing.  EXAM: PORTABLE CHEST - 1 VIEW  COMPARISON:  Chest radiograph November 20, 2012  FINDINGS: Stable to increasing mild bronchitic changes, no pleural effusion or focal consolidation. No pneumothorax. Cardiomediastinal silhouette is unremarkable. Soft tissue planes and included osseous structures are nonsuspicious.  IMPRESSION: Similar to slightly increasing mild bronchitic changes without focal consolidation.   Electronically Signed   By: Elon Alas   On: 03/14/2014 21:36    Microbiology: No results found for this or any previous visit (from the past 240 hour(s)).   Labs: Basic Metabolic Panel:  Recent Labs Lab 03/11/14 1200 03/14/14 2045  NA 138 144  K 4.3 4.1  CL 104 106  CO2 27 26  GLUCOSE 96 95  BUN 15 21  CREATININE 0.8 1.00  CALCIUM 9.5 9.8   Liver Function Tests: No results for input(s): AST, ALT, ALKPHOS, BILITOT, PROT, ALBUMIN in the last 168 hours. No results for input(s): LIPASE, AMYLASE in the last 168 hours. No results for input(s): AMMONIA in the last 168 hours. CBC:  Recent Labs Lab 03/11/14 1200 03/14/14 2045  WBC 5.9 5.6  NEUTROABS 4.1  --   HGB 14.1 13.1  HCT 42.9 38.8  MCV 95.4 92.6   PLT 246.0 217   Cardiac Enzymes:  Recent Labs Lab 03/15/14 0121 03/15/14 0630 03/15/14 1236  TROPONINI <0.30 <0.30 <0.30   BNP: BNP (last 3 results)  Recent Labs  03/14/14 2045  PROBNP 110.9   CBG: No results for input(s): GLUCAP in the last 168 hours.     Signed:  Charlynne Cousins  [facility] 03/15/2014, 2:46 PM

## 2014-03-15 NOTE — Progress Notes (Signed)
[facility] PROGRESS NOTE   Assessment/Plan: Atypical Chest pain in the setting of elevated Blood pressure: - Now resolved, cardiac markers negative. - EKG no TWI. - stress test as an outpatient.   Acid reflux - PPI.  Hypertensive urgency: - started on lisinopril, d/c verapamil. - start HCTZ. - cont to monitor BP. Has improved from 203/91->142/69. - home today after echo results.    Code Status: full Family Communication: none  Disposition Plan: observation.   Consultants:  none  Procedures:  CXR  Antibiotics:  none  HPI/Subjective: Head ache.  Objective: Filed Vitals:   03/14/14 2230 03/14/14 2300 03/15/14 0030 03/15/14 0620  BP: 171/77 161/71 144/88 142/69  Pulse: 85 86 96 83  Temp:   98.7 F (37.1 C) 98.2 F (36.8 C)  TempSrc:   Oral Oral  Resp:  15 18 19   Height:   5\' 6"  (1.676 m)   Weight:   60.6 kg (133 lb 9.6 oz)   SpO2: 99% 97% 96% 98%    Intake/Output Summary (Last 24 hours) at 03/15/14 1031 Last data filed at 03/15/14 0900  Gross per 24 hour  Intake      0 ml  Output      0 ml  Net      0 ml   Filed Weights   03/14/14 2041 03/15/14 0030  Weight: 61.236 kg (135 lb) 60.6 kg (133 lb 9.6 oz)    Exam:  General: Alert, awake, oriented x3, in no acute distress.  HEENT: No bruits, no goiter.  Heart: Regular rate and rhythm. Lungs: Good air movement, clear Abdomen: Soft, nontender, nondistended, positive bowel sounds.    Data Reviewed: Basic Metabolic Panel:  Recent Labs Lab 03/11/14 1200 03/14/14 2045  NA 138 144  K 4.3 4.1  CL 104 106  CO2 27 26  GLUCOSE 96 95  BUN 15 21  CREATININE 0.8 1.00  CALCIUM 9.5 9.8   Liver Function Tests: No results for input(s): AST, ALT, ALKPHOS, BILITOT, PROT, ALBUMIN in the last 168 hours. No results for input(s): LIPASE, AMYLASE in the last 168 hours. No results for input(s): AMMONIA in the last 168 hours. CBC:  Recent Labs Lab 03/11/14 1200 03/14/14 2045  WBC 5.9 5.6    NEUTROABS 4.1  --   HGB 14.1 13.1  HCT 42.9 38.8  MCV 95.4 92.6  PLT 246.0 217   Cardiac Enzymes:  Recent Labs Lab 03/15/14 0121 03/15/14 0630  TROPONINI <0.30 <0.30   BNP (last 3 results)  Recent Labs  03/14/14 2045  PROBNP 110.9   CBG: No results for input(s): GLUCAP in the last 168 hours.  No results found for this or any previous visit (from the past 240 hour(s)).   Studies: Dg Chest Portable 1 View  03/14/2014   CLINICAL DATA:  Chest pain, difficulty breathing.  EXAM: PORTABLE CHEST - 1 VIEW  COMPARISON:  Chest radiograph November 20, 2012  FINDINGS: Stable to increasing mild bronchitic changes, no pleural effusion or focal consolidation. No pneumothorax. Cardiomediastinal silhouette is unremarkable. Soft tissue planes and included osseous structures are nonsuspicious.  IMPRESSION: Similar to slightly increasing mild bronchitic changes without focal consolidation.   Electronically Signed   By: Elon Alas   On: 03/14/2014 21:36    Scheduled Meds: . enoxaparin (LOVENOX) injection  40 mg Subcutaneous Q24H  . lisinopril  10 mg Oral Daily  . pantoprazole  40 mg Oral Daily  . verapamil  240 mg Oral QHS   Continuous Infusions:  Charlynne Cousins  [facility] Pager [phone]. If 8PM-8AM, please contact night-coverage at [email or web address], password [facility] 03/15/2014, 10:31 AM  LOS: 1 day

## 2014-03-15 NOTE — Progress Notes (Signed)
Echo Lab  2D Echocardiogram completed.  [place], RDCS 03/15/2014 9:52 AM

## 2014-03-15 NOTE — H&P (Signed)
[facility] History and Physical  Patient: Brianna Harper  ZJQ:[id number]  DOB: November 25, 1945  DOS: the patient was seen and examined on 03/15/2014 PCP: Lottie Dawson, MD  Chief Complaint: Chest pain  HPI: Brianna Harper is a 68 y.o. female with Past medical history of anxiety, GERD, migraine, hypertension. The patient presented with complaints of chest heaviness as well as palpitation. She mentions that she has history of high blood pressure and has been following up with his PCP and regular basis for the same. Since last few days she has noticed that her blood pressure has been consistently high and was seen by her PCP at added lisinopril which she started taking 4 days ago. Despite taking lisinopril her blood pressure has remained on elevated and today she started having complaints of palpitation. For a movement she also had some chest heaviness which is located on the left side. With that she was concerned and therefore she decided to come to the hospital. At the time of my evaluation she did not have any complaint of palpitation, dizziness, chest heaviness chest tightness, chest pain, nausea, vomiting, abdominal pain. She denies any diarrhea constipation burning urination recent travel or recent leg pain.  The patient is coming from home. And at her baseline independent for most of her ADL.  Review of Systems: as mentioned in the history of present illness.  A Comprehensive review of the other systems is negative.  Past Medical History  Diagnosis Date  . Anxiety   . Hyperlipidemia   . GERD (gastroesophageal reflux disease)   . Osteopenia     dexa -2.0 hip 2008  . Rapid heart rate     hx of   . Laryngitis   . Hypertension   . Migraine     "not very often anymore; they were more menopausal" (04/16/2013)  . Depression    Past Surgical History  Procedure Laterality Date  . Colonoscopy  02/03/2004    internal hemorrhoids  . Upper gastrointestinal endoscopy   08/29/2008    gastritis, hiatal hernia   Social History:  reports that she has never smoked. She has never used smokeless tobacco. She reports that she does not drink alcohol or use illicit drugs.  Allergies  Allergen Reactions  . Erythromycin Ethylsuccinate Other (See Comments)    REACTION: GI upset  . Nitrofurantoin Hives and Swelling  . Sulfadiazine Itching and Rash    Family History  Problem Relation Age of Onset  . Hypertension Mother   . Migraines Mother   . Arthritis Father   . Ovarian cancer Maternal Grandmother   . Seizures Neg Hx     Prior to Admission medications   Medication Sig Start Date End Date Taking? Authorizing Provider  cholecalciferol (VITAMIN D) 1000 UNITS tablet Take 2,000 Units by mouth daily.    Historical Provider, MD  lisinopril (PRINIVIL,ZESTRIL) 10 MG tablet Take 1 tablet (10 mg total) by mouth daily. May increase to 20 mg per day as directed 03/11/14   Burnis Medin, MD  meclizine (ANTIVERT) 25 MG tablet Take 1 tablet (25 mg total) by mouth 3 (three) times daily as needed for dizziness. 09/19/13   Orpah Greek, MD  Multiple Vitamin (MULTIVITAMIN WITH MINERALS) TABS tablet Take 1 tablet by mouth daily.    Historical Provider, MD  omeprazole (PRILOSEC) 40 MG capsule Take 40 mg by mouth daily before breakfast.    Historical Provider, MD  trimethoprim (TRIMPEX) 100 MG tablet Take 100 mg by mouth daily as needed (  UTI symptoms).     Historical Provider, MD  verapamil (CALAN-SR) 240 MG CR tablet TAKE 1 TABLET AT BEDTIME 01/28/14   Burnis Medin, MD  verapamil (VERELAN PM) 240 MG 24 hr capsule Take 1 capsule (240 mg total) by mouth at bedtime. 03/11/14   Burnis Medin, MD    Physical Exam: Filed Vitals:   03/14/14 2200 03/14/14 2230 03/14/14 2300 03/15/14 0030  BP: 187/86 171/77 161/71 144/88  Pulse: 95 85 86 96  Temp:    98.7 F (37.1 C)  TempSrc:    Oral  Resp: 13  15 18   Height:    5\' 6"  (1.676 m)  Weight:    60.6 kg (133 lb 9.6 oz)   SpO2: 100% 99% 97% 96%    General: Alert, Awake and Oriented to Time, Place and Person. Appear in mild distress Eyes: PERRL ENT: Oral Mucosa clear moist. Neck: no JVD Cardiovascular: S1 and S2 Present, no Murmur, Peripheral Pulses Present Respiratory: Bilateral Air entry equal and Decreased, Clear to Auscultation, noCrackles, no wheezes Abdomen: Bowel Sound present, Soft and non tender Skin: no Rash Extremities: no Pedal edema, no calf tenderness Neurologic: Grossly no focal neuro deficit.  Labs on Admission:  CBC:  Recent Labs Lab 03/11/14 1200 03/14/14 2045  WBC 5.9 5.6  NEUTROABS 4.1  --   HGB 14.1 13.1  HCT 42.9 38.8  MCV 95.4 92.6  PLT 246.0 217    CMP     Component Value Date/Time   NA 144 03/14/2014 2045   K 4.1 03/14/2014 2045   CL 106 03/14/2014 2045   CO2 26 03/14/2014 2045   GLUCOSE 95 03/14/2014 2045   BUN 21 03/14/2014 2045   CREATININE 1.00 03/14/2014 2045   CALCIUM 9.8 03/14/2014 2045   CALCIUM 9.9 12/05/2009 2315   PROT 7.0 04/15/2013 1250   ALBUMIN 4.2 04/15/2013 1250   AST 14 04/15/2013 1250   ALT 11 04/15/2013 1250   ALKPHOS 55 04/15/2013 1250   BILITOT 0.6 04/15/2013 1250   GFRNONAA 57* 03/14/2014 2045   GFRAA 66* 03/14/2014 2045    No results for input(s): LIPASE, AMYLASE in the last 168 hours. No results for input(s): AMMONIA in the last 168 hours.   Recent Labs Lab 03/15/14 0121  TROPONINI <0.30   BNP (last 3 results)  Recent Labs  03/14/14 2045  PROBNP 110.9    Radiological Exams on Admission: Dg Chest Portable 1 View  03/14/2014   CLINICAL DATA:  Chest pain, difficulty breathing.  EXAM: PORTABLE CHEST - 1 VIEW  COMPARISON:  Chest radiograph November 20, 2012  FINDINGS: Stable to increasing mild bronchitic changes, no pleural effusion or focal consolidation. No pneumothorax. Cardiomediastinal silhouette is unremarkable. Soft tissue planes and included osseous structures are nonsuspicious.  IMPRESSION: Similar to slightly  increasing mild bronchitic changes without focal consolidation.   Electronically Signed   By: Elon Alas   On: 03/14/2014 21:36   EKG: Independently reviewed. sinus tachycardia.  Assessment/Plan Principal Problem:   Chest pain Active Problems:   Migraine   Hypertension   Acid reflux   1. Chest pain The patient presents with complains of chest pain. While she was in the ER she was given nitroglycerin tablet after which she had sinus tachycardia with flushing. Currently she is chest pain-free and her blood pressure has improved significantly. Most likely her chest pain was secondary to side effect of blood pressure. At present I will monitor serial troponin up an echo program in the morning  she was given nothing by mouth after midnight.  2. Accelerated hypertension. Blood pressure initially elevated 200. Currently blood pressure 750 systolic. At present I would continue her home medication. Monitoring of blood pressure.  3. Anxiety. Xanax as needed.  Advance goals of care discussion: Full code   DVT Prophylaxis: subcutaneous Heparin Nutrition: Nothing by mouth after midnight  Disposition: Admitted to observation in telemetry unit.  Author: Berle Mull, MD [facility] Pager: [phone] 03/15/2014, 5:28 AM    If 7PM-7AM, please contact night-coverage [email or web address] Password [email or web address]

## 2014-03-18 ENCOUNTER — Telehealth: Payer: Self-pay | Admitting: Family Medicine

## 2014-03-18 DIAGNOSIS — I10 Essential (primary) hypertension: Secondary | ICD-10-CM

## 2014-03-18 DIAGNOSIS — R002 Palpitations: Secondary | ICD-10-CM

## 2014-03-18 NOTE — Telephone Encounter (Signed)
Pt left a message on my machine.  Stated she was admitted into the hospital last Thursday and released on Friday @ 4 PM for HTN and palpitations.  She stated it was recommended that she see a cardiologist and that she contact her PCP for a referral.  Please advise.  Thanks!

## 2014-03-18 NOTE — Telephone Encounter (Signed)
Please initiate referral  To cardiology.

## 2014-03-19 NOTE — Telephone Encounter (Signed)
Referral placed in the system.  Left a message on patient's cell informing her that I have placed the referral.

## 2014-03-21 NOTE — Progress Notes (Signed)
Patient never came to pick up rx for clonazepam. Rx has been destroyed.

## 2014-04-01 ENCOUNTER — Other Ambulatory Visit: Payer: Self-pay | Admitting: Internal Medicine

## 2014-04-01 ENCOUNTER — Ambulatory Visit: Payer: Medicare Other | Admitting: Internal Medicine

## 2014-04-02 NOTE — Telephone Encounter (Signed)
Sent to the pharmacy by e-scribe. 

## 2014-04-04 ENCOUNTER — Telehealth: Payer: Self-pay | Admitting: Internal Medicine

## 2014-04-04 NOTE — Telephone Encounter (Signed)
[facility] - Client [place] [facility]  Patient Name: Brianna Harper  DOB: 02-13-1946    Nurse Assessment  Nurse: Erlene Quan, RN, Manuela Schwartz Date/Time Eilene Ghazi Time): 04/04/2014 10:40:21 AM  Confirm and document reason for call. If symptomatic, describe symptoms. ---Caller states is experiencing a bad cold and has HBP, what to take for decongestant? symptoms started sort of as a cough and then went to more nasal congestion - cough is more dry and tickle at night - she raelly does not want to take anything but needs to loosen this up  Has the patient traveled out of the country within the last 30 days? ---Not Applicable  Does the patient require triage? ---Yes  Related visit to physician within the last 2 weeks? ---No  Does the PT have any chronic conditions? (i.e. diabetes, asthma, etc.) ---Yes  List chronic conditions. ---high blood pressure     Guidelines    Guideline Title Affirmed Question Affirmed Notes  Common Cold Cold with no complications (all triage questions negative)    Final Disposition User   Sparkill, RN, Manuela Schwartz

## 2014-04-05 NOTE — Telephone Encounter (Signed)
Spoke with Brianna Harper and she states that she was advised by RN to try tea with honey and nasal saline spray. She says it is helping along with hot, steamy showers. Brianna Harper reports today having sinus pressure and pain and gets some relief with Tylenol. Brianna Harper would like to continue home remedies over the weekend and if sxs worsen or persist she will go to an UC over the weekend or call the office next week

## 2014-04-09 ENCOUNTER — Ambulatory Visit (INDEPENDENT_AMBULATORY_CARE_PROVIDER_SITE_OTHER): Payer: Medicare Other | Admitting: Internal Medicine

## 2014-04-09 ENCOUNTER — Encounter: Payer: Self-pay | Admitting: Internal Medicine

## 2014-04-09 VITALS — BP 140/90 | HR 86 | Temp 98.5°F | Wt 135.2 lb

## 2014-04-09 DIAGNOSIS — J069 Acute upper respiratory infection, unspecified: Secondary | ICD-10-CM

## 2014-04-09 DIAGNOSIS — J019 Acute sinusitis, unspecified: Secondary | ICD-10-CM | POA: Diagnosis not present

## 2014-04-09 DIAGNOSIS — R053 Chronic cough: Secondary | ICD-10-CM | POA: Insufficient documentation

## 2014-04-09 DIAGNOSIS — R05 Cough: Secondary | ICD-10-CM | POA: Diagnosis not present

## 2014-04-09 DIAGNOSIS — R002 Palpitations: Secondary | ICD-10-CM

## 2014-04-09 DIAGNOSIS — I10 Essential (primary) hypertension: Secondary | ICD-10-CM | POA: Diagnosis not present

## 2014-04-09 DIAGNOSIS — R059 Cough, unspecified: Secondary | ICD-10-CM

## 2014-04-09 MED ORDER — AMOXICILLIN 500 MG PO CAPS: 500.0000 mg | ORAL_CAPSULE | Freq: Three times a day (TID) | ORAL | Status: DC

## 2014-04-09 NOTE — Progress Notes (Signed)
Chief Complaint  Patient presents with  . Cough    congestion,nasal drainage, sinus pain and pressure, headache x 3 weeks     HPI: Brianna Harper 69 y.o.   comes in today for SDA for  new problem evaluation. Now 3 weeks of cough and  Cold sx and  Sit up to sleep at night and cough has continued in day also.  Headaches  Sinus pain over cheeks and frontal    No fever    Using saline  Tried left over cough med  Hydrocodone made her dizzy .   Has appt with cards from ed visit for ht that was very high palpitations and  HA. Verapamil stopped and now on aceI and hctz.  bp 140 - 150 range now palpitation mostly a  Few beat irregularity .    No cp sob  ROS: See pertinent positives and negatives per HPI.no rep disease ed RN cough a lot on her   Past Medical History  Diagnosis Date  . Anxiety   . Hyperlipidemia   . GERD (gastroesophageal reflux disease)   . Osteopenia     dexa -2.0 hip 2008  . Rapid heart rate     hx of   . Laryngitis   . Hypertension   . Migraine     "not very often anymore; they were more menopausal" (04/16/2013)  . Depression     Family History  Problem Relation Age of Onset  . Hypertension Mother   . Migraines Mother   . Arthritis Father   . Ovarian cancer Maternal Grandmother   . Seizures Neg Hx     History   Social History  . Marital Status: Married    Spouse Name: N/A    Number of Children: 2  . Years of Education: college BS   Occupational History  . retired    Social History Main Topics  . Smoking status: Never Smoker   . Smokeless tobacco: Never Used  . Alcohol Use: No  . Drug Use: No  . Sexual Activity: Yes   Other Topics Concern  . None   Social History Narrative   HHof 2    Non smoker   2 children  Also twins that did not survive birth   Married    CB x 2   No pets     Outpatient Encounter Prescriptions as of 04/09/2014  Medication Sig  . cholecalciferol (VITAMIN D) 1000 UNITS tablet Take 2,000 Units by mouth daily.  .  hydrochlorothiazide (MICROZIDE) 12.5 MG capsule Take 1 capsule (12.5 mg total) by mouth daily.  Marland Kitchen lisinopril (PRINIVIL,ZESTRIL) 5 MG tablet Take 1 tablet (5 mg total) by mouth daily.  . Multiple Vitamin (MULTIVITAMIN WITH MINERALS) TABS tablet Take 1 tablet by mouth daily.  Marland Kitchen omeprazole (PRILOSEC) 40 MG capsule TAKE 1 CAPSULE EVERY DAY WITH BREAKFAST  . [DISCONTINUED] butalbital-acetaminophen-caffeine (FIORICET, ESGIC) 50-325-40 MG per tablet Take 1 tablet by mouth every 6 (six) hours as needed for headache.  . [DISCONTINUED] verapamil (VERELAN PM) 240 MG 24 hr capsule Take 1 capsule (240 mg total) by mouth at bedtime.  Marland Kitchen amoxicillin (AMOXIL) 500 MG capsule Take 1 capsule (500 mg total) by mouth 3 (three) times daily.    EXAM:  BP 140/90 mmHg  Pulse 86  Temp(Src) 98.5 F (36.9 C) (Oral)  Wt 135 lb 3.2 oz (61.326 kg)  SpO2 98%  Body mass index is 21.83 kg/(m^2).  GENERAL: vitals reviewed and listed above, alert, oriented, appears well  hydrated and in no acute distress mild congestion non toxic  HEENT: atraumatic, conjunctiva  clear, no obvious abnormalities on inspection of external nose and ears tmx clear  Face  Mild dsicomfort   No swellingOP : no lesion edema or exudate  NECK: no obvious masses on inspection palpation  LUNGS: clear to auscultation bilaterally, no wheezes, rales or rhonchi, good air movement CV: HRRR, no clubbing cyanosis or  peripheral edema nl cap refill  MS: moves all extremities without noticeable focal  abnormality PSYCH: pleasant and cooperative, no obvious depression or anxiety BP Readings from Last 3 Encounters:  04/09/14 140/90  03/15/14 138/71  03/11/14 180/98   Lab Results  Component Value Date   WBC 5.6 03/14/2014   HGB 13.1 03/14/2014   HCT 38.8 03/14/2014   PLT 217 03/14/2014   GLUCOSE 95 03/14/2014   CHOL 202* 04/16/2013   TRIG 79 04/16/2013   HDL 69 04/16/2013   LDLDIRECT 109.8 05/01/2012   LDLCALC 117* 04/16/2013   ALT 11 04/15/2013    AST 14 04/15/2013   NA 144 03/14/2014   K 4.1 03/14/2014   CL 106 03/14/2014   CREATININE 1.00 03/14/2014   BUN 21 03/14/2014   CO2 26 03/14/2014   TSH 1.87 03/09/2013   HGBA1C 5.8 09/13/2013    ASSESSMENT AND PLAN:  Discussed the following assessment and plan:  Essential hypertension - still labile   Protracted URI  Cough - by hx poss acei aggravated  expectant magement if not resolv can change to arb class  Acute sinusitis with symptoms greater than 10 days - reasonable to add antibiotic by guideline  Palpitations Can try mucinex dm or deslym ..no decongestants  -Patient advised to return or notify health care team  if symptoms worsen ,persist or new concerns arise.  Patient Instructions  This acts like sinus infection that needs help.  Can add antibiotic .    And nasal cortisone  Such as flonase of nasacort  Every day to decrease sinus swelling and congestion Chest is clear today . Cough could be aggravated but the  BP medication lisinopril   .   Consideration of  Changing  BP medication to help this.    Sinusitis Sinusitis is redness, soreness, and inflammation of the paranasal sinuses. Paranasal sinuses are air pockets within the bones of your face (beneath the eyes, the middle of the forehead, or above the eyes). In healthy paranasal sinuses, mucus is able to drain out, and air is able to circulate through them by way of your nose. However, when your paranasal sinuses are inflamed, mucus and air can become trapped. This can allow bacteria and other germs to grow and cause infection. Sinusitis can develop quickly and last only a short time (acute) or continue over a long period (chronic). Sinusitis that lasts for more than 12 weeks is considered chronic.  CAUSES  Causes of sinusitis include:  Allergies.  Structural abnormalities, such as displacement of the cartilage that separates your nostrils (deviated septum), which can decrease the air flow through your nose and  sinuses and affect sinus drainage.  Functional abnormalities, such as when the small hairs (cilia) that line your sinuses and help remove mucus do not work properly or are not present. SIGNS AND SYMPTOMS  Symptoms of acute and chronic sinusitis are the same. The primary symptoms are pain and pressure around the affected sinuses. Other symptoms include:  Upper toothache.  Earache.  Headache.  Bad breath.  Decreased sense of smell and taste.  A cough, which worsens when you are lying flat.  Fatigue.  Fever.  Thick drainage from your nose, which often is green and may contain pus (purulent).  Swelling and warmth over the affected sinuses. DIAGNOSIS  Your health care provider will perform a physical exam. During the exam, your health care provider may:  Look in your nose for signs of abnormal growths in your nostrils (nasal polyps).  Tap over the affected sinus to check for signs of infection.  View the inside of your sinuses (endoscopy) using an imaging device that has a light attached (endoscope). If your health care provider suspects that you have chronic sinusitis, one or more of the following tests may be recommended:  Allergy tests.  Nasal culture. A sample of mucus is taken from your nose, sent to a lab, and screened for bacteria.  Nasal cytology. A sample of mucus is taken from your nose and examined by your health care provider to determine if your sinusitis is related to an allergy. TREATMENT  Most cases of acute sinusitis are related to a viral infection and will resolve on their own within 10 days. Sometimes medicines are prescribed to help relieve symptoms (pain medicine, decongestants, nasal steroid sprays, or saline sprays).  However, for sinusitis related to a bacterial infection, your health care provider will prescribe antibiotic medicines. These are medicines that will help kill the bacteria causing the infection.  Rarely, sinusitis is caused by a fungal  infection. In theses cases, your health care provider will prescribe antifungal medicine. For some cases of chronic sinusitis, surgery is needed. Generally, these are cases in which sinusitis recurs more than 3 times per year, despite other treatments. HOME CARE INSTRUCTIONS   Drink plenty of water. Water helps thin the mucus so your sinuses can drain more easily.  Use a humidifier.  Inhale steam 3 to 4 times a day (for example, sit in the bathroom with the shower running).  Apply a warm, moist washcloth to your face 3 to 4 times a day, or as directed by your health care provider.  Use saline nasal sprays to help moisten and clean your sinuses.  Take medicines only as directed by your health care provider.  If you were prescribed either an antibiotic or antifungal medicine, finish it all even if you start to feel better. SEEK IMMEDIATE MEDICAL CARE IF:  You have increasing pain or severe headaches.  You have nausea, vomiting, or drowsiness.  You have swelling around your face.  You have vision problems.  You have a stiff neck.  You have difficulty breathing. MAKE SURE YOU:   Understand these instructions.  Will watch your condition.  Will get help right away if you are not doing well or get worse. Document Released: 03/15/2005 Document Revised: 07/30/2013 Document Reviewed: 03/30/2011 [facility] Patient Information 2015 [place], [place]. This information is not intended to replace advice given to you by your health care provider. Make sure you discuss any questions you have with your health care provider.     Standley Brooking. Panosh M.D.  Pre visit review using our clinic review tool, if applicable. No additional management support is needed unless otherwise documented below in the visit note.

## 2014-04-09 NOTE — Patient Instructions (Addendum)
This acts like sinus infection that needs help.  Can add antibiotic .    And nasal cortisone  Such as flonase of nasacort  Every day to decrease sinus swelling and congestion Chest is clear today . Cough could be aggravated but the  BP medication lisinopril   .   Consideration of  Changing  BP medication to help this.    Sinusitis Sinusitis is redness, soreness, and inflammation of the paranasal sinuses. Paranasal sinuses are air pockets within the bones of your face (beneath the eyes, the middle of the forehead, or above the eyes). In healthy paranasal sinuses, mucus is able to drain out, and air is able to circulate through them by way of your nose. However, when your paranasal sinuses are inflamed, mucus and air can become trapped. This can allow bacteria and other germs to grow and cause infection. Sinusitis can develop quickly and last only a short time (acute) or continue over a long period (chronic). Sinusitis that lasts for more than 12 weeks is considered chronic.  CAUSES  Causes of sinusitis include:  Allergies.  Structural abnormalities, such as displacement of the cartilage that separates your nostrils (deviated septum), which can decrease the air flow through your nose and sinuses and affect sinus drainage.  Functional abnormalities, such as when the small hairs (cilia) that line your sinuses and help remove mucus do not work properly or are not present. SIGNS AND SYMPTOMS  Symptoms of acute and chronic sinusitis are the same. The primary symptoms are pain and pressure around the affected sinuses. Other symptoms include:  Upper toothache.  Earache.  Headache.  Bad breath.  Decreased sense of smell and taste.  A cough, which worsens when you are lying flat.  Fatigue.  Fever.  Thick drainage from your nose, which often is green and may contain pus (purulent).  Swelling and warmth over the affected sinuses. DIAGNOSIS  Your health care provider will perform a  physical exam. During the exam, your health care provider may:  Look in your nose for signs of abnormal growths in your nostrils (nasal polyps).  Tap over the affected sinus to check for signs of infection.  View the inside of your sinuses (endoscopy) using an imaging device that has a light attached (endoscope). If your health care provider suspects that you have chronic sinusitis, one or more of the following tests may be recommended:  Allergy tests.  Nasal culture. A sample of mucus is taken from your nose, sent to a lab, and screened for bacteria.  Nasal cytology. A sample of mucus is taken from your nose and examined by your health care provider to determine if your sinusitis is related to an allergy. TREATMENT  Most cases of acute sinusitis are related to a viral infection and will resolve on their own within 10 days. Sometimes medicines are prescribed to help relieve symptoms (pain medicine, decongestants, nasal steroid sprays, or saline sprays).  However, for sinusitis related to a bacterial infection, your health care provider will prescribe antibiotic medicines. These are medicines that will help kill the bacteria causing the infection.  Rarely, sinusitis is caused by a fungal infection. In theses cases, your health care provider will prescribe antifungal medicine. For some cases of chronic sinusitis, surgery is needed. Generally, these are cases in which sinusitis recurs more than 3 times per year, despite other treatments. HOME CARE INSTRUCTIONS   Drink plenty of water. Water helps thin the mucus so your sinuses can drain more easily.  Use a  humidifier.  Inhale steam 3 to 4 times a day (for example, sit in the bathroom with the shower running).  Apply a warm, moist washcloth to your face 3 to 4 times a day, or as directed by your health care provider.  Use saline nasal sprays to help moisten and clean your sinuses.  Take medicines only as directed by your health care  provider.  If you were prescribed either an antibiotic or antifungal medicine, finish it all even if you start to feel better. SEEK IMMEDIATE MEDICAL CARE IF:  You have increasing pain or severe headaches.  You have nausea, vomiting, or drowsiness.  You have swelling around your face.  You have vision problems.  You have a stiff neck.  You have difficulty breathing. MAKE SURE YOU:   Understand these instructions.  Will watch your condition.  Will get help right away if you are not doing well or get worse. Document Released: 03/15/2005 Document Revised: 07/30/2013 Document Reviewed: 03/30/2011 [facility] Patient Information 2015 [place], [place]. This information is not intended to replace advice given to you by your health care provider. Make sure you discuss any questions you have with your health care provider.

## 2014-04-10 ENCOUNTER — Telehealth: Payer: Self-pay | Admitting: Internal Medicine

## 2014-04-10 MED ORDER — LOSARTAN POTASSIUM 50 MG PO TABS: 50.0000 mg | ORAL_TABLET | Freq: Every day | ORAL | Status: DC

## 2014-04-10 NOTE — Telephone Encounter (Signed)
Called and spoke with pt and pt is aware.  Pt aware to continue the fluid pill given to her by the hospital as well.  Pt verbalized understanding.

## 2014-04-10 NOTE — Telephone Encounter (Signed)
Pt states she coughed all night and is going to stop the lisinopril (PRINIVIL,ZESTRIL) 5 MG tablet Pt would like to know what yo want her to d in the meantime, should he start back on verapamil or what?

## 2014-04-10 NOTE — Telephone Encounter (Signed)
Change to losaartan 50 mg 1 po qd disp 30 refill x 2  Cough may take some weeks to go away if from the  Medication.

## 2014-04-12 ENCOUNTER — Ambulatory Visit (INDEPENDENT_AMBULATORY_CARE_PROVIDER_SITE_OTHER): Payer: Medicare Other | Admitting: Cardiovascular Disease

## 2014-04-12 ENCOUNTER — Encounter: Payer: Self-pay | Admitting: Cardiovascular Disease

## 2014-04-12 ENCOUNTER — Telehealth: Payer: Self-pay | Admitting: Cardiology

## 2014-04-12 VITALS — BP 132/88 | HR 83 | Ht 66.0 in | Wt 136.0 lb

## 2014-04-12 DIAGNOSIS — I10 Essential (primary) hypertension: Secondary | ICD-10-CM | POA: Diagnosis not present

## 2014-04-12 NOTE — Telephone Encounter (Signed)
Pt called confused about medication instructions. She was told to take Cozaar 25 mg but has an Rx for 50 mg. I called in Cozaar 25 mg to [facility].   Kerin Ransom PA-C 04/12/2014 6:36 PM

## 2014-04-12 NOTE — Progress Notes (Signed)
Patient ID: Brianna Harper, female   DOB: 06-14-1945, 69 y.o.   MRN: [id number]    Cardiology Office Note   Date:  04/12/2014   ID:  Brianna Harper, DOB 1945-09-28, MRN [id number]  PCP:  Lottie Dawson, MD  Cardiologist:   Jenkins Rouge, MD   No chief complaint on file.     History of Present Illness: Brianna Harper is a 69 y.o. female who presents for evaluation of palpitations Referred by Dr Regis Bill  Seen in hospital 12/15 for HTN and atypical chest pain.  R/P  Verapamil Stopped and started on ACE/diuretic  Echo 12/18 reviewed and normal no LVH She has long standing history of HTN been on verapamil long time.  Since August poor control And primary started lisinopril  She developed ACE cough and stopped it on her own Been taking nothing for 48 hours.  Earlier in year had episode transient global amnesia and vertigo No palpitations, chest pain or dyspnea  No excess ETOH or salt   Study Conclusions  - Left ventricle: The cavity size was normal. Wall thickness was normal. Systolic function was normal. The estimated ejection fraction was in the range of 55% to 60%. Wall motion was normal; there were no regional wall motion abnormalities. Doppler parameters are consistent with abnormal left ventricular relaxation (grade 1 diastolic dysfunction). - Atrial septum: No defect or patent foramen ovale was identified. - Pericardium, extracardiac: A trivial pericardial effusion was identified. Features were not consistent with tamponade physiology.    Past Medical History  Diagnosis Date  . Anxiety   . Hyperlipidemia   . GERD (gastroesophageal reflux disease)   . Osteopenia     dexa -2.0 hip 2008  . Rapid heart rate     hx of   . Laryngitis   . Hypertension   . Migraine     "not very often anymore; they were more menopausal" (04/16/2013)  . Depression     Past Surgical History  Procedure Laterality Date  . Colonoscopy  02/03/2004    internal hemorrhoids    . Upper gastrointestinal endoscopy  08/29/2008    gastritis, hiatal hernia     Current Outpatient Prescriptions  Medication Sig Dispense Refill  . amoxicillin (AMOXIL) 500 MG capsule Take 1 capsule (500 mg total) by mouth 3 (three) times daily. 21 capsule 0  . cholecalciferol (VITAMIN D) 1000 UNITS tablet Take 2,000 Units by mouth daily.    . Multiple Vitamin (MULTIVITAMIN WITH MINERALS) TABS tablet Take 1 tablet by mouth daily.    Marland Kitchen omeprazole (PRILOSEC) 40 MG capsule TAKE 1 CAPSULE EVERY DAY WITH BREAKFAST 90 capsule 1   No current facility-administered medications for this visit.    Allergies:   Erythromycin ethylsuccinate; Nitrofurantoin; and Sulfadiazine    Social History:  The patient  reports that she has never smoked. She has never used smokeless tobacco. She reports that she does not drink alcohol or use illicit drugs.   Family History:  The patient's family history includes Arthritis in her father; Hypertension in her mother; Migraines in her mother; Ovarian cancer in her maternal grandmother. There is no history of Seizures.    ROS:  Please see the history of present illness.   Otherwise, review of systems are positive for none.   All other systems are reviewed and negative.    PHYSICAL EXAM: VS:  BP 132/88 mmHg  Pulse 83  Ht 5\' 6"  (1.676 m)  Wt 61.689 kg (136 lb)  BMI 21.96 kg/m2 , BMI  Body mass index is 21.96 kg/(m^2). GEN: Well nourished, well developed, in no acute distress HEENT: normal Neck: no JVD, carotid bruits, or masses Cardiac: RRR; no murmurs, rubs, or gallops,no edema  Respiratory:  clear to auscultation bilaterally, normal work of breathing GI: soft, nontender, nondistended, + BS MS: no deformity or atrophy Skin: warm and dry, no rash Neuro:  Strength and sensation are intact Psych: euthymic mood, full affect   EKG:  EKG is not ordered today. 12/18 SR rate 96 normal     Recent Labs: 04/15/2013: ALT 11 03/14/2014: BUN 21; Creatinine 1.00;  Hemoglobin 13.1; Platelets 217; Potassium 4.1; Pro B Natriuretic peptide (BNP) 110.9; Sodium 144    Lipid Panel    Component Value Date/Time   CHOL 202* 04/16/2013 0415   TRIG 79 04/16/2013 0415   HDL 69 04/16/2013 0415   CHOLHDL 2.9 04/16/2013 0415   VLDL 16 04/16/2013 0415   LDLCALC 117* 04/16/2013 0415   LDLDIRECT 109.8 05/01/2012 1107      Wt Readings from Last 3 Encounters:  04/12/14 61.689 kg (136 lb)  04/09/14 61.326 kg (135 lb 3.2 oz)  03/15/14 60.6 kg (133 lb 9.6 oz)      Other studies Reviewed: Additional studies/ records that were reviewed today include: Echo.    ASSESSMENT AND PLAN:  1.  HTN:  Start cozaar as prescribed by primary F/U Dr Regis Bill can escalate dose to 100mg  and add diuretic after that.  Would consider 24 hr BP monitor once On stable regimen  Avoid ACE with cough 2: Chest pain atypical r/o echo no RWMA  Stable observe normal ECG 3:  Vertigo stable has antivert at home    Current medicines are reviewed at length with the patient today.  The patient does not have concerns regarding medicines.  The following changes have been made:  Start cozaar  Labs/ tests ordered today include:  No orders of the defined types were placed in this encounter.     Disposition:   FU with Panosh  in 3 weeks   Signed, Jenkins Rouge, MD  04/12/2014 2:42 PM    [facility] [address], [place], [place]  [address] Phone: [phone]; Fax: [phone]

## 2014-04-12 NOTE — Patient Instructions (Signed)
Your physician recommends that you schedule a follow-up appointment in: AS NEEDED  Your physician recommends that you continue on your current medications as directed. Please refer to the Current Medication list given to you today.  

## 2014-04-22 ENCOUNTER — Encounter: Payer: Self-pay | Admitting: Internal Medicine

## 2014-04-22 ENCOUNTER — Ambulatory Visit (INDEPENDENT_AMBULATORY_CARE_PROVIDER_SITE_OTHER): Payer: Medicare Other | Admitting: Internal Medicine

## 2014-04-22 VITALS — BP 202/90 | Temp 98.1°F | Ht 66.0 in | Wt 140.4 lb

## 2014-04-22 DIAGNOSIS — R0789 Other chest pain: Secondary | ICD-10-CM | POA: Diagnosis not present

## 2014-04-22 DIAGNOSIS — I10 Essential (primary) hypertension: Secondary | ICD-10-CM

## 2014-04-22 DIAGNOSIS — K219 Gastro-esophageal reflux disease without esophagitis: Secondary | ICD-10-CM

## 2014-04-22 DIAGNOSIS — R053 Chronic cough: Secondary | ICD-10-CM

## 2014-04-22 DIAGNOSIS — R05 Cough: Secondary | ICD-10-CM

## 2014-04-22 DIAGNOSIS — R002 Palpitations: Secondary | ICD-10-CM

## 2014-04-22 MED ORDER — LOSARTAN POTASSIUM-HCTZ 50-12.5 MG PO TABS: 1.0000 | ORAL_TABLET | Freq: Every day | ORAL | Status: DC

## 2014-04-22 MED ORDER — PREDNISONE 20 MG PO TABS: ORAL_TABLET | ORAL | Status: DC

## 2014-04-22 MED ORDER — BENZONATATE 100 MG PO CAPS: 100.0000 mg | ORAL_CAPSULE | Freq: Three times a day (TID) | ORAL | Status: DC | PRN

## 2014-04-22 NOTE — Patient Instructions (Addendum)
Could can still  Be from the ace   Will change to  Combo  Of hctz  Reflux may contribute to your cough   . May consider  Changing  Reflux medication .  Return office visit  In   3-4 weeks .

## 2014-04-22 NOTE — Progress Notes (Signed)
Pre visit review using our clinic review tool, if applicable. No additional management support is needed unless otherwise documented below in the visit note.  Chief Complaint  Patient presents with  . Cough    Started December 20th.  Happens during the day and night.  Worse at night.  Unable to sleep.  Sometimes productive.   Patient Brianna Harper  comes in today for SDA for ongoing  problem evaluation. .  Cough continuing tickling . Upper chest throat area  Off ace for about 2 weeks has been on cozaar 50 for about 8 days   Dr Mariana Arn saw her and her bp up by md     Awake and then hr beating fast .   Ate breakfast.     Omeprazole  Every am   Hx of  HH .   Carlean Purl. ? having more heartburn    Also co of about 12 hours a different mid chest pain this weekend.     Achy pain .  No assoc sx .  ROS: See pertinent positives and negatives per HPI.  Past Medical History  Diagnosis Date  . Anxiety   . Hyperlipidemia   . GERD (gastroesophageal reflux disease)   . Osteopenia     dexa -2.0 hip 2008  . Rapid heart rate     hx of   . Laryngitis   . Hypertension   . Migraine     "not very often anymore; they were more menopausal" (04/16/2013)  . Depression     Family History  Problem Relation Age of Onset  . Hypertension Mother   . Migraines Mother   . Arthritis Father   . Ovarian cancer Maternal Grandmother   . Seizures Neg Hx     History   Social History  . Marital Status: Married    Spouse Name: N/A    Number of Children: 2  . Years of Education: college BS   Occupational History  . retired    Social History Main Topics  . Smoking status: Never Smoker   . Smokeless tobacco: Never Used  . Alcohol Use: No  . Drug Use: No  . Sexual Activity: Yes   Other Topics Concern  . None   Social History Narrative   HHof 2    Non smoker   2 children  Also twins that did not survive birth   Married    CB x 2   No pets     Outpatient Encounter Prescriptions as of  04/22/2014  Medication Sig  . cholecalciferol (VITAMIN D) 1000 UNITS tablet Take 2,000 Units by mouth daily.  . Multiple Vitamin (MULTIVITAMIN WITH MINERALS) TABS tablet Take 1 tablet by mouth daily.  Marland Kitchen omeprazole (PRILOSEC) 40 MG capsule TAKE 1 CAPSULE EVERY DAY WITH BREAKFAST  . [DISCONTINUED] losartan (COZAAR) 50 MG tablet Take 50 mg by mouth daily.  . benzonatate (TESSALON PERLES) 100 MG capsule Take 1 capsule (100 mg total) by mouth 3 (three) times daily as needed for cough.  . losartan-hydrochlorothiazide (HYZAAR) 50-12.5 MG per tablet Take 1 tablet by mouth daily.  . predniSONE (DELTASONE) 20 MG tablet Take 3 po qd for 2 days then 2 po qd for 3 days,or as directed  . [DISCONTINUED] amoxicillin (AMOXIL) 500 MG capsule Take 1 capsule (500 mg total) by mouth 3 (three) times daily.    EXAM:  BP 202/90 mmHg  Temp(Src) 98.1 F (36.7 C) (Oral)  Ht 5\' 6"  (1.676 m)  Wt 140 lb 6.4 oz (  63.685 kg)  BMI 22.67 kg/m2  Body mass index is 22.67 kg/(m^2).  GENERAL: vitals reviewed and listed above, alert, oriented, appears well hydrated and in no acute distress ocass cough non toxic not anxious HEENT: atraumatic, conjunctiva  clear, no obvious abnormalities on inspection of external nose and ears OP : no lesion edema or exudate  NECK: no obvious masses on inspection palpation  LUNGS: clear to auscultation bilaterally, no wheezes, rales or rhonchi, good air movement CV: HRRR, no clubbing cyanosis or  peripheral edema nl cap refill  MS: moves all extremities without noticeable focal  abnormality PSYCH: pleasant and cooperative, no obvious depression or anxiety BP Readings from Last 3 Encounters:  04/22/14 202/90  04/12/14 132/88  04/09/14 140/90   bp confirmed  ASSESSMENT AND PLAN:  Discussed the following assessment and plan:  Cough, persistent - prob ace poss post infectious  pred stay off ace  cant take hydrocodone try tessalon  Essential hypertension - now back up  add back diuerteic  then poss inc dose   Palpitations  Atypical chest pain - poss gi  has reflux omeprezole at night consdier changeppi   Gastroesophageal reflux disease, esophagitis presence not specified - poss contribution to cough consider change med Uncertain why off verapamil but i believe went off when went to ed and was on ace hctz at that time  Seemed to help the bp  But got cough   bp has shot up since off ace hctz  Option  Add hctz andif needed can increase .   Patient Instructions  Could can still  Be from the ace   Will change to  Combo  Of hctz  Reflux may contribute to your cough   . May consider  Changing  Reflux medication .  Return office visit  In   3-4 weeks .     Standley Brooking. Panosh M.D. Visit on 1 15 16    From dr Johnsie Cancel Reviewed with patient  Unaware of this plan .  Was under the impression he would not be involved in her BP care. .  1. HTN: Start cozaar as prescribed by primary F/U Dr Regis Bill can escalate dose to 100mg  and add diuretic after that. Would consider 24 hr BP monitor once On stable regimen Avoid ACE with cough 2: Chest pain atypical r/o echo no RWMA Stable observe normal ECG 3: Vertigo stable has antivert at home    Current medicines are reviewed at length with the patient today. The patient does not have concerns regarding medicines.  The following changes have been made: Start cozaar  Labs/ tests ordered today include:  No orders of the defined types were placed in this encounter.  Disposition: FU with Panosh in 3 weeks.     Signed, Jenkins Rouge, MD  04/12/2014 2:42 PM  [facility] [address], [place], [place] [address] Phone: [phone]; Fax: [phone]

## 2014-04-25 ENCOUNTER — Telehealth: Payer: Self-pay | Admitting: Internal Medicine

## 2014-04-25 DIAGNOSIS — R059 Cough, unspecified: Secondary | ICD-10-CM

## 2014-04-25 DIAGNOSIS — R05 Cough: Secondary | ICD-10-CM

## 2014-04-25 NOTE — Telephone Encounter (Signed)
Pt has pain at base of her neck. Pt is aware pulmonologist referral will be order

## 2014-04-25 NOTE — Telephone Encounter (Signed)
Pt said if you are referring her to a ENT she does not want to go see Dr Rosezella Rumpf she saw him before

## 2014-04-25 NOTE — Telephone Encounter (Signed)
Order for pulmonary placed in the system.  Spoke to the pt about the feeling of a "lump" in her throat.  Worse at night.  Has to try to sit up to sleep.  Seen ENT in the past.  Had a scope done.  Found she had damage from acid reflux.  She stated it feels the same as before.  Should we refer to ENT?  Stop pulm referral?  Do both?

## 2014-04-25 NOTE — Telephone Encounter (Signed)
Do both

## 2014-04-26 ENCOUNTER — Emergency Department (INDEPENDENT_AMBULATORY_CARE_PROVIDER_SITE_OTHER)
Admission: EM | Admit: 2014-04-26 | Discharge: 2014-04-26 | Disposition: A | Discharge status: Home / Self Care | Payer: Medicare Other | Source: Home / Self Care | Attending: Family Medicine | Admitting: Family Medicine

## 2014-04-26 ENCOUNTER — Emergency Department (INDEPENDENT_AMBULATORY_CARE_PROVIDER_SITE_OTHER): Payer: Medicare Other

## 2014-04-26 ENCOUNTER — Other Ambulatory Visit: Payer: Self-pay

## 2014-04-26 ENCOUNTER — Telehealth: Payer: Self-pay | Admitting: Internal Medicine

## 2014-04-26 ENCOUNTER — Encounter (HOSPITAL_COMMUNITY): Payer: Self-pay | Admitting: Emergency Medicine

## 2014-04-26 DIAGNOSIS — R05 Cough: Secondary | ICD-10-CM

## 2014-04-26 DIAGNOSIS — K2100 Gastro-esophageal reflux disease with esophagitis, without bleeding: Secondary | ICD-10-CM

## 2014-04-26 DIAGNOSIS — R059 Cough, unspecified: Secondary | ICD-10-CM

## 2014-04-26 LAB — POCT H PYLORI SCREEN: H. PYLORI SCREEN, POC: NEGATIVE

## 2014-04-26 MED ORDER — GI COCKTAIL ~~LOC~~
ORAL | Status: AC
  Filled 2014-04-26: qty 30

## 2014-04-26 MED ORDER — GI COCKTAIL ~~LOC~~
30.0000 mL | Freq: Once | ORAL | Status: AC
  Administered 2014-04-26: 30 mL via ORAL

## 2014-04-26 MED ORDER — OMEPRAZOLE 20 MG PO CPDR: DELAYED_RELEASE_CAPSULE | ORAL | Status: DC

## 2014-04-26 MED ORDER — SUCRALFATE 1 G PO TABS: 1.0000 g | ORAL_TABLET | Freq: Three times a day (TID) | ORAL | Status: DC

## 2014-04-26 MED ORDER — ALBUTEROL SULFATE HFA 108 (90 BASE) MCG/ACT IN AERS: 1.0000 | INHALATION_SPRAY | Freq: Four times a day (QID) | RESPIRATORY_TRACT | Status: DC | PRN

## 2014-04-26 MED ORDER — TRAMADOL HCL 50 MG PO TABS: 50.0000 mg | ORAL_TABLET | Freq: Four times a day (QID) | ORAL | Status: DC | PRN

## 2014-04-26 NOTE — Telephone Encounter (Signed)
Pt called to ask if someone can called and ask Dr Melvyn Novas office if she can get in today. She said they will not be able to see her until next week and she fill she is getting worse

## 2014-04-26 NOTE — Telephone Encounter (Signed)
Pt has appt with Dr. Annamaria Boots (pulm) on 04/29/14 @ 3 PM.  Order placed for referral to ENT.  Seen Dr. Benjamine Mola in the past.

## 2014-04-26 NOTE — Telephone Encounter (Signed)
Pt seen in ED.  Will do referrals for ENT and pulmonary.

## 2014-04-26 NOTE — ED Provider Notes (Signed)
CSN: [id number]     Arrival date & time 04/26/14  0865 History   First MD Initiated Contact with Patient 04/26/14 1013     Chief Complaint  Patient presents with  . Cough  . Chest Pain   (Consider location/radiation/quality/duration/timing/severity/associated sxs/prior Treatment) HPI Comments: Patient presents for evaluation of cough that has been present since 02/2014. Reports she was hospitalized in Dec. 2015 for atypical chest pain and elevated blood pressure and upon discharge, was placed in Lisinopril for BP control. Completed outpatient cardiology work up. No cardiac explanation for chest pain. Normal evaluation. Cough persisted and was seen by PCP for this. Lisinopril discontinued and has completed a course of antibiotics, was prescribed course of steroid and has not taken this. Using tessalon perles for cough with little relief. States cough is worse at night because when she tries to lie supine she develops a tickle in her throat at the base of her neck that precipitates a cough along with a discomfort that begins in her epigastrium and radiates up the center of her chest. Denies dyspnea. Has known hiatal hernia. Is scheduled to see Dr. Glynn Octave (pulmonary care) on 04/29/2014, but presents here because she is so frustrated by her symptoms that she cannot wait to have something else done. States she is unable to sleep as coughing keeps her awake much of the night each night. Feels she cannot wait to be seen until 04/29/2014.  Non-smoker   The history is provided by the patient.    Past Medical History  Diagnosis Date  . Anxiety   . Hyperlipidemia   . GERD (gastroesophageal reflux disease)   . Osteopenia     dexa -2.0 hip 2008  . Rapid heart rate     hx of   . Laryngitis   . Hypertension   . Migraine     "not very often anymore; they were more menopausal" (04/16/2013)  . Depression    Past Surgical History  Procedure Laterality Date  . Colonoscopy  02/03/2004    internal  hemorrhoids  . Upper gastrointestinal endoscopy  08/29/2008    gastritis, hiatal hernia   Family History  Problem Relation Age of Onset  . Hypertension Mother   . Migraines Mother   . Arthritis Father   . Ovarian cancer Maternal Grandmother   . Seizures Neg Hx    History  Substance Use Topics  . Smoking status: Never Smoker   . Smokeless tobacco: Never Used  . Alcohol Use: No   OB History    No data available     Review of Systems  Constitutional: Negative for fever, chills, diaphoresis, appetite change, fatigue and unexpected weight change.  HENT: Negative for congestion, nosebleeds, postnasal drip, rhinorrhea, sinus pressure and sneezing.        Describes a globus sensation at anterior base of neck  Eyes: Negative.   Respiratory: Positive for cough. Negative for choking, chest tightness, shortness of breath, wheezing and stridor.        No hemoptysis  Cardiovascular: Negative.   Gastrointestinal: Positive for abdominal pain. Negative for nausea, vomiting, diarrhea, constipation, blood in stool and abdominal distention.  Genitourinary: Negative.   Musculoskeletal: Negative.   Skin: Negative.   Neurological: Negative for dizziness, syncope, weakness and light-headedness.    Allergies  Erythromycin ethylsuccinate; Nitrofurantoin; Lisinopril; and Sulfadiazine  Home Medications   Prior to Admission medications   Medication Sig Start Date End Date Taking? Authorizing Provider  albuterol (PROVENTIL HFA;VENTOLIN HFA) 108 (90 BASE) MCG/ACT inhaler  Inhale 1-2 puffs into the lungs every 6 (six) hours as needed for wheezing or shortness of breath. Or persistent cough 04/26/14   Audelia Hives Matteus Mcnelly, PA  benzonatate (TESSALON PERLES) 100 MG capsule Take 1 capsule (100 mg total) by mouth 3 (three) times daily as needed for cough. 04/22/14   Burnis Medin, MD  cholecalciferol (VITAMIN D) 1000 UNITS tablet Take 2,000 Units by mouth daily.    Historical Provider, MD   losartan-hydrochlorothiazide (HYZAAR) 50-12.5 MG per tablet Take 1 tablet by mouth daily. 04/22/14   Burnis Medin, MD  Multiple Vitamin (MULTIVITAMIN WITH MINERALS) TABS tablet Take 1 tablet by mouth daily.    Historical Provider, MD  omeprazole (PRILOSEC) 20 MG capsule Take two tabs po Qam and 1 tab po Qpm 04/26/14   Audelia Hives Miciah Shealy, PA  predniSONE (DELTASONE) 20 MG tablet Take 3 po qd for 2 days then 2 po qd for 3 days,or as directed 04/22/14   Burnis Medin, MD  sucralfate (CARAFATE) 1 G tablet Take 1 tablet (1 g total) by mouth 4 (four) times daily -  with meals and at bedtime. X 5 days 04/26/14   Lutricia Feil, PA  traMADol (ULTRAM) 50 MG tablet Take 1 tablet (50 mg total) by mouth every 6 (six) hours as needed for moderate pain (and cough suppression). 04/26/14   Annett Gula H Yanilen Adamik, PA   BP 176/90 mmHg  Pulse 104  Temp(Src) 99.1 F (37.3 C) (Oral)  Resp 16  SpO2 100% Physical Exam  Constitutional: She is oriented to person, place, and time. She appears well-developed and well-nourished. No distress.  HENT:  Head: Normocephalic and atraumatic.  Right Ear: Hearing normal.  Left Ear: Hearing normal.  Nose: Nose normal.  Mouth/Throat: Uvula is midline, oropharynx is clear and moist and mucous membranes are normal.  Eyes: Conjunctivae are normal. No scleral icterus.  Neck: Neck supple. No thyromegaly present.  Cardiovascular: Normal rate, regular rhythm and normal heart sounds.   Pulmonary/Chest: Effort normal and breath sounds normal. No respiratory distress. She has no wheezes. She has no rales. She exhibits no tenderness.  Abdominal: Soft. Normal appearance and bowel sounds are normal. There is tenderness in the epigastric area. There is no rigidity, no rebound, no guarding, no tenderness at McBurney's point and negative Murphy's sign.  Musculoskeletal: Normal range of motion.  Lymphadenopathy:    She has no cervical adenopathy.  Neurological: She is alert and  oriented to person, place, and time.  Skin: Skin is warm and dry. No rash noted. No erythema.  Psychiatric: She has a normal mood and affect. Her behavior is normal.  Nursing note and vitals reviewed.   ED Course  Procedures (including critical care time) Labs Review Labs Reviewed  POCT H PYLORI SCREEN    Imaging Review Dg Chest 2 View  04/26/2014   CLINICAL DATA:  Dyspnea, cough for 1 month  EXAM: CHEST  2 VIEW  COMPARISON:  03/14/2014  FINDINGS: Cardiomediastinal silhouette is stable. Mild hyperinflation. No acute infiltrate or pleural effusion. No pulmonary edema. Mild degenerative changes and osteopenia thoracic spine.  IMPRESSION: No active cardiopulmonary disease.  Mild hyperinflation.   Electronically Signed   By: Lahoma Crocker M.D.   On: 04/26/2014 10:41     MDM   1. Cough   2. Gastroesophageal reflux disease with esophagitis   CXR unremarkable H. Pylori negative Exam benign Has already been risk stratified by cardiology. Negative cards eval.  Suggested to patient  a more aggressive approach to managing her GERD and continuing to pursue pulmonology follow up. Increase Omeprazole dosing, add 5 days of carafate, add albuterol MDI in case cough is precipitated by adjacent esophagitis causing bronchospasm and use tramadol for musculoskeletal chest discomfort and cough suppression. Advised to seek immediate re-evaluation at nearest ER should symptoms become suddenly worse or severe.  Case discussed with Dr. Erma Heritage, [place] 04/26/14 1227

## 2014-04-26 NOTE — ED Notes (Signed)
Pt states that she has had a cough since 03/17/2014 that was related to her taking lisinopril which she is now off of. Pt states that she can not get rid of cough and that this cough has worsened. Pt states that she can not sleep laying flat. Pt is in no acute distress at this time.

## 2014-04-26 NOTE — Discharge Instructions (Signed)
Chest xray was unremarkable and your test for H. Pylori was negative. Please review and begin taking new medications prescribed today. Follow up with Dr. Annamaria Boots on 04/29/2014. If symptoms become suddenly worse or severe, please have yourself re-evaluated at your nearest ER.   Cough, Adult  A cough is a reflex that helps clear your throat and airways. It can help heal the body or may be a reaction to an irritated airway. A cough may only last 2 or 3 weeks (acute) or may last more than 8 weeks (chronic).  CAUSES Acute cough:  Viral or bacterial infections. Chronic cough:  Infections.  Allergies.  Asthma.  Post-nasal drip.  Smoking.  Heartburn or acid reflux.  Some medicines.  Chronic lung problems (COPD).  Cancer. SYMPTOMS   Cough.  Fever.  Chest pain.  Increased breathing rate.  High-pitched whistling sound when breathing (wheezing).  Colored mucus that you cough up (sputum). TREATMENT   A bacterial cough may be treated with antibiotic medicine.  A viral cough must run its course and will not respond to antibiotics.  Your caregiver may recommend other treatments if you have a chronic cough. HOME CARE INSTRUCTIONS   Only take over-the-counter or prescription medicines for pain, discomfort, or fever as directed by your caregiver. Use cough suppressants only as directed by your caregiver.  Use a cold steam vaporizer or humidifier in your bedroom or home to help loosen secretions.  Sleep in a semi-upright position if your cough is worse at night.  Rest as needed.  Stop smoking if you smoke. SEEK IMMEDIATE MEDICAL CARE IF:   You have pus in your sputum.  Your cough starts to worsen.  You cannot control your cough with suppressants and are losing sleep.  You begin coughing up blood.  You have difficulty breathing.  You develop pain which is getting worse or is uncontrolled with medicine.  You have a fever. MAKE SURE YOU:   Understand these  instructions.  Will watch your condition.  Will get help right away if you are not doing well or get worse. Document Released: 09/11/2010 Document Revised: 06/07/2011 Document Reviewed: 09/11/2010 [facility] Patient Information 2015 [place], [place]. This information is not intended to replace advice given to you by your health care provider. Make sure you discuss any questions you have with your health care provider.  Esophagitis Esophagitis is inflammation of the esophagus. It can involve swelling, soreness, and pain in the esophagus. This condition can make it difficult and painful to swallow. CAUSES  Most causes of esophagitis are not serious. Many different factors can cause esophagitis, including:  Gastroesophageal reflux disease (GERD). This is when acid from your stomach flows up into the esophagus.  Recurrent vomiting.  An allergic-type reaction.  Certain medicines, especially those that come in large pills.  Ingestion of harmful chemicals, such as household cleaning products.  Heavy alcohol use.  An infection of the esophagus.  Radiation treatment for cancer.  Certain diseases such as sarcoidosis, Crohn's disease, and scleroderma. These diseases may cause recurrent esophagitis. SYMPTOMS   Trouble swallowing.  Painful swallowing.  Chest pain.  Difficulty breathing.  Nausea.  Vomiting.  Abdominal pain. DIAGNOSIS  Your caregiver will take your history and do a physical exam. Depending upon what your caregiver finds, certain tests may also be done, including:  Barium X-ray. You will drink a solution that coats the esophagus, and X-rays will be taken.  Endoscopy. A lighted tube is put down the esophagus so your caregiver can examine the area.  Allergy tests. These can sometimes be arranged through follow-up visits. TREATMENT  Treatment will depend on the cause of your esophagitis. In some cases, steroids or other medicines may be given to help relieve your  symptoms or to treat the underlying cause of your condition. Medicines that may be recommended include:  Viscous lidocaine, to soothe the esophagus.  Antacids.  Acid reducers.  Proton pump inhibitors.  Antiviral medicines for certain viral infections of the esophagus.  Antifungal medicines for certain fungal infections of the esophagus.  Antibiotic medicines, depending on the cause of the esophagitis. HOME CARE INSTRUCTIONS   Avoid foods and drinks that seem to make your symptoms worse.  Eat small, frequent meals instead of large meals.  Avoid eating for the 3 hours prior to your bedtime.  If you have trouble taking pills, use a pill splitter to decrease the size and likelihood of the pill getting stuck or injuring the esophagus on the way down. Drinking water after taking a pill also helps.  Stop smoking if you smoke.  Maintain a healthy weight.  Wear loose-fitting clothing. Do not wear anything tight around your waist that causes pressure on your stomach.  Raise the head of your bed 6 to 8 inches with wood blocks to help you sleep. Extra pillows will not help.  Only take over-the-counter or prescription medicines as directed by your caregiver. SEEK IMMEDIATE MEDICAL CARE IF:  You have severe chest pain that radiates into your arm, neck, or jaw.  You feel sweaty, dizzy, or lightheaded.  You have shortness of breath.  You vomit blood.  You have difficulty or pain with swallowing.  You have bloody or black, tarry stools.  You have a fever.  You have a burning sensation in the chest more than 3 times a week for more than 2 weeks.  You cannot swallow, drink, or eat.  You drool because you cannot swallow your saliva. MAKE SURE YOU:  Understand these instructions.  Will watch your condition.  Will get help right away if you are not doing well or get worse. Document Released: 04/22/2004 Document Revised: 06/07/2011 Document Reviewed: 11/13/2010 [facility]  Patient Information 2015 [place], [place]. This information is not intended to replace advice given to you by your health care provider. Make sure you discuss any questions you have with your health care provider.  Gastroesophageal Reflux Disease, Adult Gastroesophageal reflux disease (GERD) happens when acid from your stomach flows up into the esophagus. When acid comes in contact with the esophagus, the acid causes soreness (inflammation) in the esophagus. Over time, GERD may create small holes (ulcers) in the lining of the esophagus. CAUSES   Increased body weight. This puts pressure on the stomach, making acid rise from the stomach into the esophagus.  Smoking. This increases acid production in the stomach.  Drinking alcohol. This causes decreased pressure in the lower esophageal sphincter (valve or ring of muscle between the esophagus and stomach), allowing acid from the stomach into the esophagus.  Late evening meals and a full stomach. This increases pressure and acid production in the stomach.  A malformed lower esophageal sphincter. Sometimes, no cause is found. SYMPTOMS   Burning pain in the lower part of the mid-chest behind the breastbone and in the mid-stomach area. This may occur twice a week or more often.  Trouble swallowing.  Sore throat.  Dry cough.  Asthma-like symptoms including chest tightness, shortness of breath, or wheezing. DIAGNOSIS  Your caregiver may be able to diagnose GERD based on  your symptoms. In some cases, X-rays and other tests may be done to check for complications or to check the condition of your stomach and esophagus. TREATMENT  Your caregiver may recommend over-the-counter or prescription medicines to help decrease acid production. Ask your caregiver before starting or adding any new medicines.  HOME CARE INSTRUCTIONS   Change the factors that you can control. Ask your caregiver for guidance concerning weight loss, quitting smoking, and alcohol  consumption.  Avoid foods and drinks that make your symptoms worse, such as:  Caffeine or alcoholic drinks.  Chocolate.  Peppermint or mint flavorings.  Garlic and onions.  Spicy foods.  Citrus fruits, such as oranges, lemons, or limes.  Tomato-based foods such as sauce, chili, salsa, and pizza.  Fried and fatty foods.  Avoid lying down for the 3 hours prior to your bedtime or prior to taking a nap.  Eat small, frequent meals instead of large meals.  Wear loose-fitting clothing. Do not wear anything tight around your waist that causes pressure on your stomach.  Raise the head of your bed 6 to 8 inches with wood blocks to help you sleep. Extra pillows will not help.  Only take over-the-counter or prescription medicines for pain, discomfort, or fever as directed by your caregiver.  Do not take aspirin, ibuprofen, or other nonsteroidal anti-inflammatory drugs (NSAIDs). SEEK IMMEDIATE MEDICAL CARE IF:   You have pain in your arms, neck, jaw, teeth, or back.  Your pain increases or changes in intensity or duration.  You develop nausea, vomiting, or sweating (diaphoresis).  You develop shortness of breath, or you faint.  Your vomit is green, yellow, black, or looks like coffee grounds or blood.  Your stool is red, bloody, or black. These symptoms could be signs of other problems, such as heart disease, gastric bleeding, or esophageal bleeding. MAKE SURE YOU:   Understand these instructions.  Will watch your condition.  Will get help right away if you are not doing well or get worse. Document Released: 12/23/2004 Document Revised: 06/07/2011 Document Reviewed: 10/02/2010 [facility] Patient Information 2015 [place], [place]. This information is not intended to replace advice given to you by your health care provider. Make sure you discuss any questions you have with your health care provider.  Heartburn Heartburn is a painful, burning sensation in the chest. It may feel  worse in certain positions, such as lying down or bending over. It is caused by stomach acid backing up into the tube that carries food from the mouth down to the stomach (lower esophagus).  CAUSES   Large meals.  Certain foods and drinks.  Exercise.  Increased acid production.  Being overweight or obese.  Certain medicines. SYMPTOMS   Burning pain in the chest or lower throat.  Bitter taste in the mouth.  Coughing. DIAGNOSIS  If the usual treatments for heartburn do not improve your symptoms, then tests may be done to see if there is another condition present. Possible tests may include:  X-rays.  Endoscopy. This is when a tube with a light and a camera on the end is used to examine the esophagus and the stomach.  A test to measure the amount of acid in the esophagus (pH test).  A test to see if the esophagus is working properly (esophageal manometry).  Blood, breath, or stool tests to check for bacteria that cause ulcers. TREATMENT   Your caregiver may tell you to use certain over-the-counter medicines (antacids, acid reducers) for mild heartburn.  Your caregiver may prescribe  medicines to decrease the acid in your stomach or protect your stomach lining.  Your caregiver may recommend certain diet changes.  For severe cases, your caregiver may recommend that the head of your bed be elevated on blocks. (Sleeping with more pillows is not an effective treatment as it only changes the position of your head and does not improve the main problem of stomach acid refluxing into the esophagus.) HOME CARE INSTRUCTIONS   Take all medicines as directed by your caregiver.  Raise the head of your bed by putting blocks under the legs if instructed to by your caregiver.  Do not exercise right after eating.  Avoid eating 2 or 3 hours before bed. Do not lie down right after eating.  Eat small meals throughout the day instead of 3 large meals.  Stop smoking if you  smoke.  Maintain a healthy weight.  Identify foods and beverages that make your symptoms worse and avoid them. Foods you may want to avoid include:  Peppers.  Chocolate.  High-fat foods, including fried foods.  Spicy foods.  Garlic and onions.  Citrus fruits, including oranges, grapefruit, lemons, and limes.  Food containing tomatoes or tomato products.  Mint.  Carbonated drinks, caffeinated drinks, and alcohol.  Vinegar. SEEK IMMEDIATE MEDICAL CARE IF:  You have severe chest pain that goes down your arm or into your jaw or neck.  You feel sweaty, dizzy, or lightheaded.  You are short of breath.  You vomit blood.  You have difficulty or pain with swallowing.  You have bloody or black, tarry stools.  You have episodes of heartburn more than 3 times a week for more than 2 weeks. MAKE SURE YOU:  Understand these instructions.  Will watch your condition.  Will get help right away if you are not doing well or get worse. Document Released: 08/01/2008 Document Revised: 06/07/2011 Document Reviewed: 08/30/2010 [facility] Patient Information 2015 [place], [place]. This information is not intended to replace advice given to you by your health care provider. Make sure you discuss any questions you have with your health care provider.  Food Choices for Gastroesophageal Reflux Disease When you have gastroesophageal reflux disease (GERD), the foods you eat and your eating habits are very important. Choosing the right foods can help ease your discomfort.  WHAT GUIDELINES DO I NEED TO FOLLOW?   Choose fruits, vegetables, whole grains, and low-fat dairy products.   Choose low-fat meat, fish, and poultry.  Limit fats such as oils, salad dressings, butter, nuts, and avocado.   Keep a food diary. This helps you identify foods that cause symptoms.   Avoid foods that cause symptoms. These may be different for everyone.   Eat small meals often instead of 3 large meals a day.    Eat your meals slowly, in a place where you are relaxed.   Limit fried foods.   Cook foods using methods other than frying.   Avoid drinking alcohol.   Avoid drinking large amounts of liquids with your meals.   Avoid bending over or lying down until 2-3 hours after eating.  WHAT FOODS ARE NOT RECOMMENDED?  These are some foods and drinks that may make your symptoms worse: Vegetables Tomatoes. Tomato juice. Tomato and spaghetti sauce. Chili peppers. Onion and garlic. Horseradish. Fruits Oranges, grapefruit, and lemon (fruit and juice). Meats High-fat meats, fish, and poultry. This includes hot dogs, ribs, ham, sausage, salami, and bacon. Dairy Whole milk and chocolate milk. Sour cream. Cream. Butter. Ice cream. Cream cheese.  Drinks  Coffee and tea. Bubbly (carbonated) drinks or energy drinks. Condiments Hot sauce. Barbecue sauce.  Sweets/Desserts Chocolate and cocoa. Donuts. Peppermint and spearmint. Fats and Oils High-fat foods. This includes [place] fries and potato chips. Other Vinegar. Strong spices. This includes black pepper, white pepper, red pepper, cayenne, curry powder, cloves, ginger, and chili powder. The items listed above may not be a complete list of foods and drinks to avoid. Contact your dietitian for more information. Document Released: 09/14/2011 Document Revised: 03/20/2013 Document Reviewed: 01/17/2013 [facility] Patient Information 2015 [place], [place]. This information is not intended to replace advice given to you by your health care provider. Make sure you discuss any questions you have with your health care provider.

## 2014-04-29 ENCOUNTER — Encounter: Payer: Self-pay | Admitting: Internal Medicine

## 2014-04-29 ENCOUNTER — Ambulatory Visit (INDEPENDENT_AMBULATORY_CARE_PROVIDER_SITE_OTHER): Payer: Medicare Other | Admitting: Internal Medicine

## 2014-04-29 VITALS — BP 142/86 | HR 114 | Ht 66.0 in | Wt 133.0 lb

## 2014-04-29 DIAGNOSIS — R059 Cough, unspecified: Secondary | ICD-10-CM

## 2014-04-29 DIAGNOSIS — L509 Urticaria, unspecified: Secondary | ICD-10-CM

## 2014-04-29 DIAGNOSIS — R05 Cough: Secondary | ICD-10-CM | POA: Diagnosis not present

## 2014-04-29 DIAGNOSIS — K219 Gastro-esophageal reflux disease without esophagitis: Secondary | ICD-10-CM

## 2014-04-29 MED ORDER — PROMETHAZINE-CODEINE 6.25-10 MG/5ML PO SYRP: 5.0000 mL | ORAL_SOLUTION | Freq: Four times a day (QID) | ORAL | Status: DC | PRN

## 2014-04-29 MED ORDER — BENZONATATE 200 MG PO CAPS: 200.0000 mg | ORAL_CAPSULE | Freq: Three times a day (TID) | ORAL | Status: DC | PRN

## 2014-04-29 NOTE — Assessment & Plan Note (Signed)
She has very recently increased her omeprazole to twice daily before meals and is careful about eating before lying down and sleeping propped up.

## 2014-04-29 NOTE — Patient Instructions (Signed)
Continue reflux precautions - don't eat for an hour before bedtime; prop up to sleep, take the omeprazole 40  In AM, 20 in PM- before meals  Script for prometh codeine  Try benzonatate perles  200 mg every 8 hours as needed for cough  We will ask Dr Regis Bill to try to have you off both lisinopril and losartan for a one month trial, while we see if we can get this cough to stop. Instead, suggest a calcium channel- blocker or beta-blocker.

## 2014-04-29 NOTE — Progress Notes (Signed)
04/29/14- [name] never smoker referred courtesy of Dr Regis Bill.: cough with clear mucus since 03/17/14. Pt denies wheezing,chest congestion;tightness, sob. Pt has been switched  from Lisinopril to Losartan/HCTZ and has since developed a rash on chest and back.  Husband here She relates onset of annoying cough to change from verapamil to lisinopril several months ago. Subsequently changed to losartan/HCT on January 13 but cough has persisted. No wheeze or shortness of breath. Hard intermittent cough is dramatically worse lying down. Tussive soreness between xiphoid and umbilicus. Aware of globus sensation associated with hiatal hernia and some heartburn. Her omeprazole dose was recently increased to 40 mg before breakfast and 20 mg before dinner. She is having to sleep propped up. She denies postnasal drainage. Denies history of lung disease. She was given prescriptions for benzonatate 100 mg and for prednisone, which she has not filled. In the last few days she has noted a few small red, minimally productive spots between shoulder blades and on sternum. No generalized rash or adenopathy. Chest x-ray was clear  Prior to Admission medications   Medication Sig Start Date End Date Taking? Authorizing Provider  cholecalciferol (VITAMIN D) 1000 UNITS tablet Take 2,000 Units by mouth daily.   Yes Historical Provider, MD  losartan-hydrochlorothiazide (HYZAAR) 50-12.5 MG per tablet Take 1 tablet by mouth daily. 04/22/14  Yes Burnis Medin, MD  Multiple Vitamin (MULTIVITAMIN WITH MINERALS) TABS tablet Take 1 tablet by mouth daily.   Yes Historical Provider, MD  omeprazole (PRILOSEC) 20 MG capsule Take two tabs po Qam and 1 tab po Qpm 04/26/14  Yes Audelia Hives Presson, PA  albuterol (PROVENTIL HFA;VENTOLIN HFA) 108 (90 BASE) MCG/ACT inhaler Inhale 1-2 puffs into the lungs every 6 (six) hours as needed for wheezing or shortness of breath. Or persistent cough Patient not taking: Reported on 04/29/2014 04/26/14    Audelia Hives Presson, PA  benzonatate (TESSALON PERLES) 100 MG capsule Take 1 capsule (100 mg total) by mouth 3 (three) times daily as needed for cough. Patient not taking: Reported on 04/29/2014 04/22/14   Burnis Medin, MD  benzonatate (TESSALON) 200 MG capsule Take 1 capsule (200 mg total) by mouth 3 (three) times daily as needed for cough. 04/29/14   Deneise Lever, MD  predniSONE (DELTASONE) 20 MG tablet Take 3 po qd for 2 days then 2 po qd for 3 days,or as directed Patient not taking: Reported on 04/29/2014 04/22/14   Burnis Medin, MD  promethazine-codeine (PHENERGAN WITH CODEINE) 6.25-10 MG/5ML syrup Take 5 mLs by mouth every 6 (six) hours as needed for cough. 04/29/14   Deneise Lever, MD  sucralfate (CARAFATE) 1 G tablet Take 1 tablet (1 g total) by mouth 4 (four) times daily -  with meals and at bedtime. X 5 days Patient not taking: Reported on 04/29/2014 04/26/14   Lutricia Feil, PA  traMADol (ULTRAM) 50 MG tablet Take 1 tablet (50 mg total) by mouth every 6 (six) hours as needed for moderate pain (and cough suppression). Patient not taking: Reported on 04/29/2014 04/26/14   Lutricia Feil, PA   Past Medical History  Diagnosis Date  . Anxiety   . Hyperlipidemia   . GERD (gastroesophageal reflux disease)   . Osteopenia     dexa -2.0 hip 2008  . Rapid heart rate     hx of   . Laryngitis   . Hypertension   . Migraine     "not very often anymore; they were  more menopausal" (04/16/2013)  . Depression    Past Surgical History  Procedure Laterality Date  . Colonoscopy  02/03/2004    internal hemorrhoids  . Upper gastrointestinal endoscopy  08/29/2008    gastritis, hiatal hernia   Family History  Problem Relation Age of Onset  . Hypertension Mother   . Migraines Mother   . Arthritis Father   . Ovarian cancer Maternal Grandmother   . Seizures Neg Hx    History   Social History  . Marital Status: Married    Spouse Name: N/A    Number of Children: 2  . Years of  Education: college BS   Occupational History  . retired    Social History Main Topics  . Smoking status: Never Smoker   . Smokeless tobacco: Never Used  . Alcohol Use: No  . Drug Use: No  . Sexual Activity: Yes   Other Topics Concern  . Not on file   Social History Narrative   HHof 2    Non smoker   2 children  Also twins that did not survive birth   Married    CB x 2   No pets    ROS-see HPI Constitutional:   No-   weight loss, night sweats, fevers, chills, fatigue, lassitude. HEENT:   No-  headaches, difficulty swallowing, tooth/dental problems, sore throat,       No-  sneezing, itching, ear ache, nasal congestion, post nasal drip,  CV:  No-   chest pain, orthopnea, PND, swelling in lower extremities, anasarca,                                  dizziness, palpitations Resp: No-   shortness of breath with exertion or at rest.              No-   productive cough,  + non-productive cough,  No- coughing up of blood.              No-   change in color of mucus.  No- wheezing.   Skin:+ rash or lesions. GI:  + heartburn, indigestion, abdominal pain, no-nausea, vomiting, diarrhea,                 change in bowel habits, loss of appetite GU: No-   dysuria, change in color of urine, no urgency or frequency.  No- flank pain. MS:  No-   joint pain or swelling.  No- decreased range of motion.  No- back pain. Neuro-     nothing unusual Psych:  No- change in mood or affect. No depression or anxiety.  No memory loss.  OBJ- Physical Exam General- Alert, Oriented, Affect-appropriate, Distress- none acute Skin- rash-none, lesions- none, excoriation- none Lymphadenopathy- none Head- atraumatic            Eyes- Gross vision intact, PERRLA, conjunctivae and secretions clear            Ears- Hearing, canals-normal            Nose- Clear, no-Septal dev, mucus, polyps, erosion, perforation             Throat- Mallampati II , mucosa clear , drainage- none, tonsils- atrophic Neck- flexible ,  trachea midline, no stridor , thyroid nl, carotid no bruit Chest - symmetrical excursion , unlabored           Heart/CV- RRR , no murmur , no gallop  , no  rub, nl s1 s2                           - JVD- none , edema- none, stasis changes- none, varices- none           Lung- clear to P&A, wheeze- none, cough- none , dullness-none, rub- none           Chest wall-  Abd- tender-no, distended-no, bowel sounds-present, HSM- no Br/ Gen/ Rectal- Not done, not indicated Extrem- cyanosis- none, clubbing, none, atrophy- none, strength- nl Neuro- grossly intact to observation

## 2014-04-29 NOTE — Assessment & Plan Note (Signed)
It can take a month for lisinopril-related cough to fade. 2 weeks ago she changed from ACEI to ARB/HCTZ. Cough can occur with losartan although much less, and then with lisinopril. She is distressed enough by this issue now to suggest switch to something with little or no cough such as a selective beta blocker like metoprolol, or a calcium channel blocker. A one month trial would be sufficient-if cough resolves then can consider going back to BP medication of choice and watching for return of cough. Association of GERD/heartburn symptoms with her cough suggests the possibility that she has developed a cyclical cough pattern with cough-induced reflux which then sustains upper airway nerve irritability and continued coughing. Plan-twice daily omeprazole, codeine-based cough syrup for nighttime, benzonatate 200 mg for daytime use as needed. Sips of liquids and throat lozenges. Reflux  control measures She will discuss change of blood pressure medication for trial, with Dr. Regis Bill.

## 2014-04-29 NOTE — Assessment & Plan Note (Signed)
A few small red spots between shoulder blades and on sternum are nonspecific. Early drug rash or punctate urticaria is possible. Plan-hopefully we can get her off of losartan/HCTZ to see if this responds.

## 2014-04-30 ENCOUNTER — Telehealth: Payer: Self-pay | Admitting: Internal Medicine

## 2014-04-30 MED ORDER — CHLORTHALIDONE 25 MG PO TABS: 12.5000 mg | ORAL_TABLET | Freq: Every day | ORAL | Status: DC

## 2014-04-30 NOTE — Telephone Encounter (Signed)
Read the note  Temporary change in bp med  gop back to the verapamil 240 mg 24 hour version. Add chlorthalidone 25 gm take 1/2 po qd or 12.5 mg   In addition .  Disp 30 refill x 1   Continue bp checks and  Keep  05/13/2014 appt

## 2014-04-30 NOTE — Telephone Encounter (Signed)
Pt saw dr young yesterday and per pt dr young suppose to send dr Regis Bill email. Per pt dr young would her to stop taking losartan-hctz. Pt still have verampril er 240 mg #90. Please verify

## 2014-04-30 NOTE — Telephone Encounter (Signed)
Pt notified that [facility] agrees to go back to verapamil 240 mg and start chlorthalidone.  Instructed her to continue to keep a record of bp readings and bring them with her to OV on 05/13/14

## 2014-05-02 ENCOUNTER — Institutional Professional Consult (permissible substitution): Payer: Medicare Other | Admitting: Internal Medicine

## 2014-05-06 DIAGNOSIS — R070 Pain in throat: Secondary | ICD-10-CM | POA: Diagnosis not present

## 2014-05-06 DIAGNOSIS — R05 Cough: Secondary | ICD-10-CM | POA: Diagnosis not present

## 2014-05-06 DIAGNOSIS — K219 Gastro-esophageal reflux disease without esophagitis: Secondary | ICD-10-CM | POA: Diagnosis not present

## 2014-05-07 ENCOUNTER — Encounter: Payer: Self-pay | Admitting: Internal Medicine

## 2014-05-07 ENCOUNTER — Telehealth: Payer: Self-pay | Admitting: Internal Medicine

## 2014-05-07 ENCOUNTER — Ambulatory Visit (INDEPENDENT_AMBULATORY_CARE_PROVIDER_SITE_OTHER): Payer: Medicare Other | Admitting: Internal Medicine

## 2014-05-07 VITALS — BP 152/90 | HR 76 | Ht 66.0 in | Wt 131.8 lb

## 2014-05-07 DIAGNOSIS — R05 Cough: Secondary | ICD-10-CM | POA: Diagnosis not present

## 2014-05-07 DIAGNOSIS — K219 Gastro-esophageal reflux disease without esophagitis: Secondary | ICD-10-CM

## 2014-05-07 DIAGNOSIS — M949 Disorder of cartilage, unspecified: Secondary | ICD-10-CM | POA: Diagnosis not present

## 2014-05-07 DIAGNOSIS — R059 Cough, unspecified: Secondary | ICD-10-CM

## 2014-05-07 DIAGNOSIS — Z72820 Sleep deprivation: Secondary | ICD-10-CM

## 2014-05-07 DIAGNOSIS — R0789 Other chest pain: Secondary | ICD-10-CM

## 2014-05-07 MED ORDER — OMEPRAZOLE 20 MG PO CPDR: DELAYED_RELEASE_CAPSULE | ORAL | Status: DC

## 2014-05-07 MED ORDER — CLONAZEPAM 0.5 MG PO TABS: 0.5000 mg | ORAL_TABLET | Freq: Every evening | ORAL | Status: DC | PRN

## 2014-05-07 MED ORDER — OMEPRAZOLE-SODIUM BICARBONATE 40-1100 MG PO CAPS: 1.0000 | ORAL_CAPSULE | Freq: Every day | ORAL | Status: DC

## 2014-05-07 MED ORDER — OMEPRAZOLE-SODIUM BICARBONATE 20-1100 MG PO CAPS: 1.0000 | ORAL_CAPSULE | Freq: Every day | ORAL | Status: DC

## 2014-05-07 MED ORDER — ALUM HYDROXIDE-MAG CARBONATE 95-358 MG/15ML PO SUSP: 15.0000 mL | ORAL | Status: DC | PRN

## 2014-05-07 NOTE — Patient Instructions (Addendum)
Today your being given a printed rx for klonopin to take to your pharmacy.  Stop your generic zantac , use gaviscon as needed.  We have sent the following medications to your pharmacy for you to pick up at your convenience: zegerid to take at bedtime   Stop your omeprazole supper dose but take 40mg  before breakfast. Patient has both 20mg  and 40 mg tablets.  Follow up with Dr. Carlean Purl as needed.  I appreciate the opportunity to care for you.

## 2014-05-07 NOTE — Progress Notes (Addendum)
   Subjective:    Patient ID: Brianna Harper, female    DOB: [birth year], 69 y.o.   MRN: [id number] Chief Complaint is cough and GERD HPI  The patient is here with her husband because she's been having problems with cough and some of this is been attributed to reflux. She was having blood pressure control issues and was started on lisinopril in December and then developed a cough. The cough persisted, she was not aware that lisinopril can cause it. Eventually she saw Dr. Regis Bill and she was changed to losartan but the cough has persisted. She has been to urgent care, she has been to Dr. Annamaria Boots. She is gradually getting better but continues to have a tickle in her throat at night despite being on a twice a day PPI and now just recently nocturnal ranitidine. That tickle in her throat will cause her that cough some and have reflux she drinks water and eventually after while it settles down but she has had interrupted sleep for some time now and does not feel rested. There is mild globus sensation. She is sore in her xiphoid area. 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 I believe that's when the ranitidine was added at bedtime. In the meantime her blood pressure medicines were changed, losartan was stopped because of possible cross-reactivity and cough symptoms. She is on verapamil and chlorthalidone now.  Medications, allergies, past medical history, past surgical history, family history and social history are reviewed and updated in the EMR.  Review of Systems As per history of present illness    Objective:   Physical Exam BP 152/90 mmHg  Pulse 76  Ht 5\' 6"  (1.676 m)  Wt 131 lb 12.8 oz (59.784 kg)  BMI 21.28 kg/m2 Thin, NAD Tender at xiphoid not remainder of abdomen Pharynx is clear  neck supple no thyromegaly or mass nontender Mood is appropriate not overtly anxious  I reviewed notes from primary care, pulmonary, labs in the EMR chest x-ray  from January in urgent medical care visit.     Assessment & Plan:  Cough - due to Lisinopril and possibly losartan - improving. ? Some component of GERD, with cyclical cough also  Xiphoid pain - likely from coughing  Gastroesophageal reflux disease, esophagitis presence not specified   Lack of adequate sleep   1. Reassured as much as possible today that medications induced cough and it should resolve 2. DC PM omeprazole 3. Zegerid 40 mg qhs and continue for 1 month after sxs resolve 4. Gaviscon prn throat sxs/tickle at night 5. Reserve nocturnal metaclopramide if this fails to work 6. Clonazepam 0.5 mg qhs prn to help sleep 7. F/U by phone and/or My Chart for now 8. Doubt needs recurrent ENT evaluations at this point    [id number] Harmon Pier, MD Keturah Barre, MD   She called back and 40 mg Rx Zegerid too costly so will use OTC

## 2014-05-07 NOTE — Telephone Encounter (Signed)
40 mg Zegerid too expensive so she will use OTC

## 2014-05-08 NOTE — Telephone Encounter (Signed)
Patient states that Dr Carlean Purl did discuss this with her last night.

## 2014-05-09 ENCOUNTER — Encounter: Payer: Self-pay | Admitting: Internal Medicine

## 2014-05-09 MED ORDER — HYDROCOD POLST-CHLORPHEN POLST 10-8 MG/5ML PO LQCR: 5.0000 mL | Freq: Every evening | ORAL | Status: DC | PRN

## 2014-05-09 NOTE — Telephone Encounter (Signed)
I called her - we will not use clonazepam given prior transient global amnesia when on that in past  Short term tussionex wi;ll be tried

## 2014-05-13 ENCOUNTER — Ambulatory Visit (INDEPENDENT_AMBULATORY_CARE_PROVIDER_SITE_OTHER): Payer: Medicare Other | Admitting: Internal Medicine

## 2014-05-13 ENCOUNTER — Telehealth: Payer: Self-pay

## 2014-05-13 ENCOUNTER — Encounter: Payer: Self-pay | Admitting: Internal Medicine

## 2014-05-13 VITALS — BP 144/78 | HR 88 | Temp 97.9°F | Ht 65.0 in | Wt 132.6 lb

## 2014-05-13 DIAGNOSIS — I10 Essential (primary) hypertension: Secondary | ICD-10-CM

## 2014-05-13 DIAGNOSIS — J387 Other diseases of larynx: Secondary | ICD-10-CM | POA: Diagnosis not present

## 2014-05-13 DIAGNOSIS — R05 Cough: Secondary | ICD-10-CM

## 2014-05-13 DIAGNOSIS — K5900 Constipation, unspecified: Secondary | ICD-10-CM

## 2014-05-13 DIAGNOSIS — K219 Gastro-esophageal reflux disease without esophagitis: Secondary | ICD-10-CM | POA: Diagnosis not present

## 2014-05-13 DIAGNOSIS — R053 Chronic cough: Secondary | ICD-10-CM

## 2014-05-13 LAB — BASIC METABOLIC PANEL
BUN: 13 mg/dL (ref 6–23)
CALCIUM: 10.3 mg/dL (ref 8.4–10.5)
CO2: 34 mEq/L — ABNORMAL HIGH (ref 19–32)
Chloride: 100 mEq/L (ref 96–112)
Creatinine, Ser: 0.94 mg/dL (ref 0.40–1.20)
GFR: 63.07 mL/min (ref >60.00)
GLUCOSE: 101 mg/dL — AB (ref 70–99)
Potassium: 5 mEq/L (ref 3.5–5.1)
Sodium: 139 mEq/L (ref 135–145)

## 2014-05-13 NOTE — Patient Instructions (Signed)
Stool softener  As needed and can add miralax .  Capful 1-2 per day as needed  Or MOM .  High potassium foods at this time.    Check labs today for potassium.  Take blood pressure readings twice a day for   10  days and then periodically .To ensure below 140/90   .Send in readings     On my chart .  And pulse .   Glad your cough is better .   Potassium Content of Foods Potassium is a mineral found in many foods and drinks. It helps keep fluids and minerals balanced in your body and affects how steadily your heart beats. Potassium also helps control your blood pressure and keep your muscles and nervous system healthy. Certain health conditions and medicines may change the balance of potassium in your body. When this happens, you can help balance your level of potassium through the foods that you do or do not eat. Your health care provider or dietitian may recommend an amount of potassium that you should have each day. The following lists of foods provide the amount of potassium (in parentheses) per serving in each item. HIGH IN POTASSIUM  The following foods and beverages have 200 mg or more of potassium per serving:  Apricots, 2 raw or 5 dry (200 mg).  Artichoke, 1 medium (345 mg).  Avocado, raw,  each (245 mg).  Banana, 1 medium (425 mg).  Beans, lima, or baked beans, canned,  cup (280 mg).  Beans, white, canned,  cup (595 mg).  Beef roast, 3 oz (320 mg).  Beef, ground, 3 oz (270 mg).  Beets, raw or cooked,  cup (260 mg).  Bran muffin, 2 oz (300 mg).  Broccoli,  cup (230 mg).  Brussels sprouts,  cup (250 mg).  Cantaloupe,  cup (215 mg).  Cereal, 100% bran,  cup (200-400 mg).  Cheeseburger, single, fast food, 1 each (225-400 mg).  Chicken, 3 oz (220 mg).  Clams, canned, 3 oz (535 mg).  Crab, 3 oz (225 mg).  Dates, 5 each (270 mg).  Dried beans and peas,  cup (300-475 mg).  Figs, dried, 2 each (260 mg).  Fish: halibut, tuna, cod, snapper, 3 oz (480  mg).  Fish: salmon, haddock, swordfish, perch, 3 oz (300 mg).  Fish, tuna, canned 3 oz (200 mg).  [place] fries, fast food, 3 oz (470 mg).  Granola with fruit and nuts,  cup (200 mg).  Grapefruit juice,  cup (200 mg).  Greens, beet,  cup (655 mg).  Honeydew melon,  cup (200 mg).  Kale, raw, 1 cup (300 mg).  Kiwi, 1 medium (240 mg).  Kohlrabi, rutabaga, parsnips,  cup (280 mg).  Lentils,  cup (365 mg).  Mango, 1 each (325 mg).  Milk, chocolate, 1 cup (420 mg).  Milk: nonfat, low-fat, whole, buttermilk, 1 cup (350-380 mg).  Molasses, 1 Tbsp (295 mg).  Mushrooms,  cup (280) mg.  Nectarine, 1 each (275 mg).  Nuts: almonds, peanuts, hazelnuts, [place], cashew, mixed, 1 oz (200 mg).  Nuts, pistachios, 1 oz (295 mg).  Orange, 1 each (240 mg).  Orange juice,  cup (235 mg).  Papaya, medium,  fruit (390 mg).  Peanut butter, chunky, 2 Tbsp (240 mg).  Peanut butter, smooth, 2 Tbsp (210 mg).  Pear, 1 medium (200 mg).  Pomegranate, 1 whole (400 mg).  Pomegranate juice,  cup (215 mg).  Pork, 3 oz (350 mg).  Potato chips, salted, 1 oz (465 mg).  Potato, baked  with skin, 1 medium (925 mg).  Potatoes, boiled,  cup (255 mg).  Potatoes, mashed,  cup (330 mg).  Prune juice,  cup (370 mg).  Prunes, 5 each (305 mg).  Pudding, chocolate,  cup (230 mg).  Pumpkin, canned,  cup (250 mg).  Raisins, seedless,  cup (270 mg).  Seeds, sunflower or pumpkin, 1 oz (240 mg).  Soy milk, 1 cup (300 mg).  Spinach,  cup (420 mg).  Spinach, canned,  cup (370 mg).  Sweet potato, baked with skin, 1 medium (450 mg).  Swiss chard,  cup (480 mg).  Tomato or vegetable juice,  cup (275 mg).  Tomato sauce or puree,  cup (400-550 mg).  Tomato, raw, 1 medium (290 mg).  Tomatoes, canned,  cup (200-300 mg).  [place], 3 oz (250 mg).  Wheat germ, 1 oz (250 mg).  Winter squash,  cup (250 mg).  Yogurt, plain or fruited, 6 oz (260-435 mg).  Zucchini,   cup (220 mg). MODERATE IN POTASSIUM The following foods and beverages have 50-200 mg of potassium per serving:  Apple, 1 each (150 mg).  Apple juice,  cup (150 mg).  Applesauce,  cup (90 mg).  Apricot nectar,  cup (140 mg).  Asparagus, small spears,  cup or 6 spears (155 mg).  Bagel, cinnamon raisin, 1 each (130 mg).  Bagel, egg or plain, 4 in., 1 each (70 mg).  Beans, green,  cup (90 mg).  Beans, yellow,  cup (190 mg).  Beer, regular, 12 oz (100 mg).  Beets, canned,  cup (125 mg).  Blackberries,  cup (115 mg).  Blueberries,  cup (60 mg).  Bread, whole wheat, 1 slice (70 mg).  Broccoli, raw,  cup (145 mg).  Cabbage,  cup (150 mg).  Carrots, cooked or raw,  cup (180 mg).  Cauliflower, raw,  cup (150 mg).  Celery, raw,  cup (155 mg).  Cereal, bran flakes, cup (120-150 mg).  Cheese, cottage,  cup (110 mg).  Cherries, 10 each (150 mg).  Chocolate, 1 oz bar (165 mg).  Coffee, brewed 6 oz (90 mg).  Corn,  cup or 1 ear (195 mg).  Cucumbers,  cup (80 mg).  Egg, large, 1 each (60 mg).  Eggplant,  cup (60 mg).  Endive, raw, cup (80 mg).  English muffin, 1 each (65 mg).  Fish, orange roughy, 3 oz (150 mg).  Frankfurter, beef or pork, 1 each (75 mg).  Fruit cocktail,  cup (115 mg).  Grape juice,  cup (170 mg).  Grapefruit,  fruit (175 mg).  Grapes,  cup (155 mg).  Greens: kale, turnip, collard,  cup (110-150 mg).  Ice cream or frozen yogurt, chocolate,  cup (175 mg).  Ice cream or frozen yogurt, vanilla,  cup (120-150 mg).  Lemons, limes, 1 each (80 mg).  Lettuce, all types, 1 cup (100 mg).  Mixed vegetables,  cup (150 mg).  Mushrooms, raw,  cup (110 mg).  Nuts: walnuts, pecans, or macadamia, 1 oz (125 mg).  Oatmeal,  cup (80 mg).  Okra,  cup (110 mg).  Onions, raw,  cup (120 mg).  Peach, 1 each (185 mg).  Peaches, canned,  cup (120 mg).  Pears, canned,  cup (120 mg).  Peas, green, frozen,   cup (90 mg).  Peppers, green,  cup (130 mg).  Peppers, red,  cup (160 mg).  Pineapple juice,  cup (165 mg).  Pineapple, fresh or canned,  cup (100 mg).  Plums, 1 each (105 mg).  Pudding, vanilla,  cup (  150 mg).  Raspberries,  cup (90 mg).  Rhubarb,  cup (115 mg).  Rice, wild,  cup (80 mg).  Shrimp, 3 oz (155 mg).  Spinach, raw, 1 cup (170 mg).  Strawberries,  cup (125 mg).  Summer squash  cup (175-200 mg).  Swiss chard, raw, 1 cup (135 mg).  Tangerines, 1 each (140 mg).  Tea, brewed, 6 oz (65 mg).  Turnips,  cup (140 mg).  Watermelon,  cup (85 mg).  Wine, red, table, 5 oz (180 mg).  Wine, white, table, 5 oz (100 mg). LOW IN POTASSIUM The following foods and beverages have less than 50 mg of potassium per serving.  Bread, white, 1 slice (30 mg).  Carbonated beverages, 12 oz (less than 5 mg).  Cheese, 1 oz (20-30 mg).  Cranberries,  cup (45 mg).  Cranberry juice cocktail,  cup (20 mg).  Fats and oils, 1 Tbsp (less than 5 mg).  Hummus, 1 Tbsp (32 mg).  Nectar: papaya, mango, or pear,  cup (35 mg).  Rice, white or brown,  cup (50 mg).  Spaghetti or macaroni,  cup cooked (30 mg).  Tortilla, flour or corn, 1 each (50 mg).  Waffle, 4 in., 1 each (50 mg).  Water chestnuts,  cup (40 mg). Document Released: 10/27/2004 Document Revised: 03/20/2013 Document Reviewed: 02/09/2013 [facility] Patient Information 2015 [place], [place]. This information is not intended to replace advice given to you by your health care provider. Make sure you discuss any questions you have with your health care provider.

## 2014-05-13 NOTE — Telephone Encounter (Signed)
Faxed completed form to [facility] at fax# [phone] for prior authorization request for tussionex.  Will await out come.

## 2014-05-13 NOTE — Progress Notes (Signed)
Chief Complaint  Patient presents with  . Follow-up    cough se  . Hypertension    HPI: Brianna Harper  Fu bp cough se of meds  Cough is a lot better now .   See note dr Annamaria Boots.Also dr Oralia Rud felt to be tacei triggered and short run to dc  Arb and  suggeste ccb and poss selective b bl;ocker such as metoprolol.   Had  Scope last week   8/10  Inflammation.   Teoh  . Feels  Lower tracheal area issue  zegerid at night   Also and in am .   Weight   Strict diet  To the extreme to help with reflux  .   BP :   Around  140/90+   Constipated from cough med and other med also maybe    pulse is not low  ROS: See pertinent positives and negatives per HPI.  Past Medical History  Diagnosis Date  . Anxiety   . Hyperlipidemia   . GERD (gastroesophageal reflux disease)   . Osteopenia     dexa -2.0 hip 2008  . Rapid heart rate     hx of   . Laryngitis   . Hypertension   . Migraine     "not very often anymore; they were more menopausal" (04/16/2013)  . Depression     Family History  Problem Relation Age of Onset  . Hypertension Mother   . Migraines Mother   . Arthritis Father   . Ovarian cancer Maternal Grandmother   . Seizures Neg Hx     History   Social History  . Marital Status: Married    Spouse Name: N/A  . Number of Children: 2  . Years of Education: college BS   Occupational History  . retired    Social History Main Topics  . Smoking status: Never Smoker   . Smokeless tobacco: Never Used  . Alcohol Use: No  . Drug Use: No  . Sexual Activity: Yes   Other Topics Concern  . None   Social History Narrative   HHof 2    Non smoker   2 children  Also twins that did not survive birth   Married    CB x 2   No pets     Outpatient Encounter Prescriptions as of 05/13/2014  Medication Sig  . aluminum hydroxide-magnesium carbonate (GAVISCON) 95-358 MG/15ML SUSP Take 15 mLs by mouth as needed for indigestion or heartburn.  .  chlorpheniramine-HYDROcodone (TUSSIONEX) 10-8 MG/5ML LQCR Take 5 mLs by mouth at bedtime as needed for cough.  . chlorthalidone (HYGROTON) 25 MG tablet Take 0.5 tablets (12.5 mg total) by mouth daily.  . cholecalciferol (VITAMIN D) 1000 UNITS tablet Take 2,000 Units by mouth daily.  . Multiple Vitamin (MULTIVITAMIN WITH MINERALS) TABS tablet Take 1 tablet by mouth daily.  Marland Kitchen omeprazole (PRILOSEC) 40 MG capsule Take 40 mg by mouth daily.  Earney Navy Bicarbonate (ZEGERID) 20-1100 MG CAPS capsule Take 1 capsule by mouth at bedtime.  . verapamil (CALAN-SR) 240 MG CR tablet Take 240 mg by mouth at bedtime.  . [DISCONTINUED] omeprazole (PRILOSEC) 20 MG capsule Take two tabs po Qam    EXAM:  BP 144/78 mmHg  Pulse 88  Temp(Src) 97.9 F (36.6 C) (Oral)  Ht 5\' 5"  (1.651 m)  Wt 132 lb 9.6 oz (60.147 kg)  BMI 22.07 kg/m2  Body mass index is 22.07 kg/(m^2).  GENERAL: vitals reviewed and listed above,  alert, oriented, appears well hydrated and in no acute distress HEENT: atraumatic, conjunctiva  clear, no obvious abnormalities on inspection of external nose and ears NECK: no obvious masses on inspection palpation  LUNGS: clear to auscultation bilaterally, no wheezes, rales or rhonchi, good air movement CV: HRRR, no clubbing cyanosis  nl cap refill  MS: moves all extremities without noticeable focal  abnormality PSYCH: pleasant and cooperative, no obvious depression or anxiety BP Readings from Last 3 Encounters:  05/13/14 144/78  05/07/14 152/90  04/29/14 142/86    ASSESSMENT AND PLAN:  Discussed the following assessment and plan:  Essential hypertension - Plan: Basic metabolic panel  Laryngopharyngeal reflux (LPR)  Gastroesophageal reflux disease, esophagitis presence not specified  Cough, persistent - acei induced finally getting better   Constipation, unspecified constipation type - eems form medication at this time. Have been off and on various meds.  For now remain on  this and follow  If not controlled consider adding b blocker although is no  ccb also . pulse if generally high .   Or inc diuretic with the risk of metabolic derangement SE  Check bmp today send in readings   And plan rov in   About 2 months depending on bp control etc .  -Patient advised to return or notify health care team  if symptoms worsen ,persist or new concerns arise.  Patient Instructions  Stool softener  As needed and can add miralax .  Capful 1-2 per day as needed  Or MOM .  High potassium foods at this time.    Check labs today for potassium.  Take blood pressure readings twice a day for   10  days and then periodically .To ensure below 140/90   .Send in readings     On my chart .  And pulse .   Glad your cough is better .   Potassium Content of Foods Potassium is a mineral found in many foods and drinks. It helps keep fluids and minerals balanced in your body and affects how steadily your heart beats. Potassium also helps control your blood pressure and keep your muscles and nervous system healthy. Certain health conditions and medicines may change the balance of potassium in your body. When this happens, you can help balance your level of potassium through the foods that you do or do not eat. Your health care provider or dietitian may recommend an amount of potassium that you should have each day. The following lists of foods provide the amount of potassium (in parentheses) per serving in each item. HIGH IN POTASSIUM  The following foods and beverages have 200 mg or more of potassium per serving:  Apricots, 2 raw or 5 dry (200 mg).  Artichoke, 1 medium (345 mg).  Avocado, raw,  each (245 mg).  Banana, 1 medium (425 mg).  Beans, lima, or baked beans, canned,  cup (280 mg).  Beans, white, canned,  cup (595 mg).  Beef roast, 3 oz (320 mg).  Beef, ground, 3 oz (270 mg).  Beets, raw or cooked,  cup (260 mg).  Bran muffin, 2 oz (300 mg).  Broccoli,  cup (230  mg).  Brussels sprouts,  cup (250 mg).  Cantaloupe,  cup (215 mg).  Cereal, 100% bran,  cup (200-400 mg).  Cheeseburger, single, fast food, 1 each (225-400 mg).  Chicken, 3 oz (220 mg).  Clams, canned, 3 oz (535 mg).  Crab, 3 oz (225 mg).  Dates, 5 each (270 mg).  Dried beans and  peas,  cup (300-475 mg).  Figs, dried, 2 each (260 mg).  Fish: halibut, tuna, cod, snapper, 3 oz (480 mg).  Fish: salmon, haddock, swordfish, perch, 3 oz (300 mg).  Fish, tuna, canned 3 oz (200 mg).  [place] fries, fast food, 3 oz (470 mg).  Granola with fruit and nuts,  cup (200 mg).  Grapefruit juice,  cup (200 mg).  Greens, beet,  cup (655 mg).  Honeydew melon,  cup (200 mg).  Kale, raw, 1 cup (300 mg).  Kiwi, 1 medium (240 mg).  Kohlrabi, rutabaga, parsnips,  cup (280 mg).  Lentils,  cup (365 mg).  Mango, 1 each (325 mg).  Milk, chocolate, 1 cup (420 mg).  Milk: nonfat, low-fat, whole, buttermilk, 1 cup (350-380 mg).  Molasses, 1 Tbsp (295 mg).  Mushrooms,  cup (280) mg.  Nectarine, 1 each (275 mg).  Nuts: almonds, peanuts, hazelnuts, [place], cashew, mixed, 1 oz (200 mg).  Nuts, pistachios, 1 oz (295 mg).  Orange, 1 each (240 mg).  Orange juice,  cup (235 mg).  Papaya, medium,  fruit (390 mg).  Peanut butter, chunky, 2 Tbsp (240 mg).  Peanut butter, smooth, 2 Tbsp (210 mg).  Pear, 1 medium (200 mg).  Pomegranate, 1 whole (400 mg).  Pomegranate juice,  cup (215 mg).  Pork, 3 oz (350 mg).  Potato chips, salted, 1 oz (465 mg).  Potato, baked with skin, 1 medium (925 mg).  Potatoes, boiled,  cup (255 mg).  Potatoes, mashed,  cup (330 mg).  Prune juice,  cup (370 mg).  Prunes, 5 each (305 mg).  Pudding, chocolate,  cup (230 mg).  Pumpkin, canned,  cup (250 mg).  Raisins, seedless,  cup (270 mg).  Seeds, sunflower or pumpkin, 1 oz (240 mg).  Soy milk, 1 cup (300 mg).  Spinach,  cup (420 mg).  Spinach, canned,  cup (370  mg).  Sweet potato, baked with skin, 1 medium (450 mg).  Swiss chard,  cup (480 mg).  Tomato or vegetable juice,  cup (275 mg).  Tomato sauce or puree,  cup (400-550 mg).  Tomato, raw, 1 medium (290 mg).  Tomatoes, canned,  cup (200-300 mg).  [place], 3 oz (250 mg).  Wheat germ, 1 oz (250 mg).  Winter squash,  cup (250 mg).  Yogurt, plain or fruited, 6 oz (260-435 mg).  Zucchini,  cup (220 mg). MODERATE IN POTASSIUM The following foods and beverages have 50-200 mg of potassium per serving:  Apple, 1 each (150 mg).  Apple juice,  cup (150 mg).  Applesauce,  cup (90 mg).  Apricot nectar,  cup (140 mg).  Asparagus, small spears,  cup or 6 spears (155 mg).  Bagel, cinnamon raisin, 1 each (130 mg).  Bagel, egg or plain, 4 in., 1 each (70 mg).  Beans, green,  cup (90 mg).  Beans, yellow,  cup (190 mg).  Beer, regular, 12 oz (100 mg).  Beets, canned,  cup (125 mg).  Blackberries,  cup (115 mg).  Blueberries,  cup (60 mg).  Bread, whole wheat, 1 slice (70 mg).  Broccoli, raw,  cup (145 mg).  Cabbage,  cup (150 mg).  Carrots, cooked or raw,  cup (180 mg).  Cauliflower, raw,  cup (150 mg).  Celery, raw,  cup (155 mg).  Cereal, bran flakes, cup (120-150 mg).  Cheese, cottage,  cup (110 mg).  Cherries, 10 each (150 mg).  Chocolate, 1 oz bar (165 mg).  Coffee, brewed 6 oz (90 mg).  Corn,  cup  or 1 ear (195 mg).  Cucumbers,  cup (80 mg).  Egg, large, 1 each (60 mg).  Eggplant,  cup (60 mg).  Endive, raw, cup (80 mg).  English muffin, 1 each (65 mg).  Fish, orange roughy, 3 oz (150 mg).  Frankfurter, beef or pork, 1 each (75 mg).  Fruit cocktail,  cup (115 mg).  Grape juice,  cup (170 mg).  Grapefruit,  fruit (175 mg).  Grapes,  cup (155 mg).  Greens: kale, turnip, collard,  cup (110-150 mg).  Ice cream or frozen yogurt, chocolate,  cup (175 mg).  Ice cream or frozen yogurt, vanilla,  cup (120-150  mg).  Lemons, limes, 1 each (80 mg).  Lettuce, all types, 1 cup (100 mg).  Mixed vegetables,  cup (150 mg).  Mushrooms, raw,  cup (110 mg).  Nuts: walnuts, pecans, or macadamia, 1 oz (125 mg).  Oatmeal,  cup (80 mg).  Okra,  cup (110 mg).  Onions, raw,  cup (120 mg).  Peach, 1 each (185 mg).  Peaches, canned,  cup (120 mg).  Pears, canned,  cup (120 mg).  Peas, green, frozen,  cup (90 mg).  Peppers, green,  cup (130 mg).  Peppers, red,  cup (160 mg).  Pineapple juice,  cup (165 mg).  Pineapple, fresh or canned,  cup (100 mg).  Plums, 1 each (105 mg).  Pudding, vanilla,  cup (150 mg).  Raspberries,  cup (90 mg).  Rhubarb,  cup (115 mg).  Rice, wild,  cup (80 mg).  Shrimp, 3 oz (155 mg).  Spinach, raw, 1 cup (170 mg).  Strawberries,  cup (125 mg).  Summer squash  cup (175-200 mg).  Swiss chard, raw, 1 cup (135 mg).  Tangerines, 1 each (140 mg).  Tea, brewed, 6 oz (65 mg).  Turnips,  cup (140 mg).  Watermelon,  cup (85 mg).  Wine, red, table, 5 oz (180 mg).  Wine, white, table, 5 oz (100 mg). LOW IN POTASSIUM The following foods and beverages have less than 50 mg of potassium per serving.  Bread, white, 1 slice (30 mg).  Carbonated beverages, 12 oz (less than 5 mg).  Cheese, 1 oz (20-30 mg).  Cranberries,  cup (45 mg).  Cranberry juice cocktail,  cup (20 mg).  Fats and oils, 1 Tbsp (less than 5 mg).  Hummus, 1 Tbsp (32 mg).  Nectar: papaya, mango, or pear,  cup (35 mg).  Rice, white or brown,  cup (50 mg).  Spaghetti or macaroni,  cup cooked (30 mg).  Tortilla, flour or corn, 1 each (50 mg).  Waffle, 4 in., 1 each (50 mg).  Water chestnuts,  cup (40 mg). Document Released: 10/27/2004 Document Revised: 03/20/2013 Document Reviewed: 02/09/2013 [facility] Patient Information 2015 [place], [place]. This information is not intended to replace advice given to you by your health care provider. Make sure you  discuss any questions you have with your health care provider.         Standley Brooking. Panosh M.D.

## 2014-05-15 ENCOUNTER — Encounter: Payer: Self-pay | Admitting: Internal Medicine

## 2014-05-15 NOTE — Telephone Encounter (Signed)
Received fax that tussionex approved thru 05/15/2016

## 2014-05-16 ENCOUNTER — Encounter: Payer: Self-pay | Admitting: Internal Medicine

## 2014-05-16 NOTE — Telephone Encounter (Signed)
Pt wants to know if this message from yesterday has been addressed.  Advised that unless she has received a call, it will be addressed in a timely manner.  She wants a message sent to the nurse to make sure she will address her email messages.

## 2014-05-17 ENCOUNTER — Telehealth: Payer: Self-pay | Admitting: Internal Medicine

## 2014-05-17 ENCOUNTER — Other Ambulatory Visit: Payer: Self-pay | Admitting: Internal Medicine

## 2014-05-17 ENCOUNTER — Telehealth: Payer: Self-pay | Admitting: *Deleted

## 2014-05-17 ENCOUNTER — Ambulatory Visit (AMBULATORY_SURGERY_CENTER): Payer: Self-pay | Admitting: *Deleted

## 2014-05-17 VITALS — Ht 66.0 in | Wt 131.0 lb

## 2014-05-17 DIAGNOSIS — K219 Gastro-esophageal reflux disease without esophagitis: Secondary | ICD-10-CM

## 2014-05-17 DIAGNOSIS — R1013 Epigastric pain: Secondary | ICD-10-CM

## 2014-05-17 MED ORDER — VERAPAMIL HCL ER 240 MG PO TBCR: 240.0000 mg | EXTENDED_RELEASE_TABLET | Freq: Every day | ORAL | Status: DC

## 2014-05-17 MED ORDER — LIDOCAINE VISCOUS 2 % MT SOLN: 10.0000 mL | OROMUCOSAL | Status: DC | PRN

## 2014-05-17 MED ORDER — CHLORTHALIDONE 25 MG PO TABS: 12.5000 mg | ORAL_TABLET | Freq: Every day | ORAL | Status: DC

## 2014-05-17 NOTE — Telephone Encounter (Signed)
Medicine sent to pharmacy per Dr Carlean Purl , pt and husband given hand outs on full liquids and  soft diet  As per dr Carlean Purl.   Pt and husband asked many questions and answered and told to call with more questions if they have any. Number given  Brianna Harper

## 2014-05-17 NOTE — Progress Notes (Signed)
No egg or soy allergy No diet pills No 02 use at home Post op N/V in the past but no other s/s  Pt declined emmi video Pt states she has had some shortness of breath along with this abd.pain as well See TE today to Dr Carlean Purl.

## 2014-05-17 NOTE — Telephone Encounter (Signed)
I spoke with the patient and scheduled for 05/22/14 she will come in today for pre-visit

## 2014-05-17 NOTE — Telephone Encounter (Signed)
Pt request refill verapamil (CALAN-SR) 240 MG CR tablet  chlorthalidone (HYGROTON) 25 MG tablet  [facility] mailorder 90 day Pt needs asap.

## 2014-05-17 NOTE — Telephone Encounter (Signed)
Brianna Harper is in PV now. She has been able to eat minimal foods, like 3 bites of potato and half a sand wich and she has severe epigastric pain. She states she has lost 7 lbs in the last few weeks. OV with you 04-30-14. She was asking is there anything she can take besides her omeprazole 40 mg am and 20 mg before dinner to help her pain. Her egd with you is 2-24 Wednesday at 1030 am.  Thanks marie PV

## 2014-05-17 NOTE — Telephone Encounter (Signed)
As discussed over phone Full liquid to soft diet Viscous xylocaine 5-10 cc qAC # 8oz no refill

## 2014-05-17 NOTE — Telephone Encounter (Signed)
Per WP, okay to send in 6 months. Sent by e-scribe.

## 2014-05-20 ENCOUNTER — Telehealth: Payer: Self-pay | Admitting: Internal Medicine

## 2014-05-20 MED ORDER — CHLORTHALIDONE 25 MG PO TABS: 12.5000 mg | ORAL_TABLET | Freq: Every day | ORAL | Status: DC

## 2014-05-20 MED ORDER — VERAPAMIL HCL ER 240 MG PO TBCR: 240.0000 mg | EXTENDED_RELEASE_TABLET | Freq: Every day | ORAL | Status: DC

## 2014-05-20 NOTE — Telephone Encounter (Signed)
Patient  prescriptions was suppose to be sent to [facility] not [facility]    Pt request that it be sent asap to [facility]     chlorthalidone (HYGROTON) 25 MG tablet,  verapamil (CALAN-SR) 240 MG CR tablet

## 2014-05-20 NOTE — Telephone Encounter (Signed)
Re-sent to the correct pharmacy

## 2014-05-22 ENCOUNTER — Encounter: Payer: Self-pay | Admitting: Internal Medicine

## 2014-05-22 ENCOUNTER — Emergency Department (HOSPITAL_COMMUNITY): Payer: Medicare Other

## 2014-05-22 ENCOUNTER — Encounter (HOSPITAL_COMMUNITY): Payer: Self-pay | Admitting: Emergency Medicine

## 2014-05-22 ENCOUNTER — Ambulatory Visit (AMBULATORY_SURGERY_CENTER): Payer: Medicare Other | Admitting: Internal Medicine

## 2014-05-22 ENCOUNTER — Emergency Department (HOSPITAL_COMMUNITY)
Admission: EM | Admit: 2014-05-22 | Discharge: 2014-05-22 | Disposition: A | Discharge status: Home / Self Care | Payer: Medicare Other | Attending: Emergency Medicine | Admitting: Emergency Medicine

## 2014-05-22 VITALS — BP 161/73 | HR 76 | Temp 97.8°F | Resp 14 | Ht 66.0 in | Wt 131.0 lb

## 2014-05-22 DIAGNOSIS — K219 Gastro-esophageal reflux disease without esophagitis: Secondary | ICD-10-CM

## 2014-05-22 DIAGNOSIS — R0789 Other chest pain: Secondary | ICD-10-CM | POA: Diagnosis not present

## 2014-05-22 DIAGNOSIS — E860 Dehydration: Secondary | ICD-10-CM | POA: Diagnosis not present

## 2014-05-22 DIAGNOSIS — Z8639 Personal history of other endocrine, nutritional and metabolic disease: Secondary | ICD-10-CM | POA: Diagnosis not present

## 2014-05-22 DIAGNOSIS — Z8709 Personal history of other diseases of the respiratory system: Secondary | ICD-10-CM | POA: Diagnosis not present

## 2014-05-22 DIAGNOSIS — Z8739 Personal history of other diseases of the musculoskeletal system and connective tissue: Secondary | ICD-10-CM | POA: Insufficient documentation

## 2014-05-22 DIAGNOSIS — R1013 Epigastric pain: Secondary | ICD-10-CM | POA: Insufficient documentation

## 2014-05-22 DIAGNOSIS — F329 Major depressive disorder, single episode, unspecified: Secondary | ICD-10-CM | POA: Diagnosis not present

## 2014-05-22 DIAGNOSIS — I10 Essential (primary) hypertension: Secondary | ICD-10-CM | POA: Diagnosis not present

## 2014-05-22 DIAGNOSIS — R0602 Shortness of breath: Secondary | ICD-10-CM | POA: Diagnosis not present

## 2014-05-22 DIAGNOSIS — R000 Tachycardia, unspecified: Secondary | ICD-10-CM | POA: Insufficient documentation

## 2014-05-22 DIAGNOSIS — F419 Anxiety disorder, unspecified: Secondary | ICD-10-CM | POA: Insufficient documentation

## 2014-05-22 DIAGNOSIS — Z79899 Other long term (current) drug therapy: Secondary | ICD-10-CM | POA: Insufficient documentation

## 2014-05-22 DIAGNOSIS — G43909 Migraine, unspecified, not intractable, without status migrainosus: Secondary | ICD-10-CM | POA: Insufficient documentation

## 2014-05-22 LAB — BASIC METABOLIC PANEL
ANION GAP: 17 — AB (ref 5–15)
BUN: 13 mg/dL (ref 6–23)
CALCIUM: 9.8 mg/dL (ref 8.4–10.5)
CO2: 21 mmol/L (ref 19–32)
Chloride: 99 mmol/L (ref 96–112)
Creatinine, Ser: 0.97 mg/dL (ref 0.50–1.10)
GFR, EST AFRICAN AMERICAN: 69 mL/min — AB (ref >90)
GFR, EST NON AFRICAN AMERICAN: 59 mL/min — AB (ref >90)
GLUCOSE: 136 mg/dL — AB (ref 70–99)
Potassium: 3 mmol/L — ABNORMAL LOW (ref 3.5–5.1)
Sodium: 137 mmol/L (ref 135–145)

## 2014-05-22 LAB — CBC
HCT: 40 % (ref 36.0–46.0)
Hemoglobin: 13.9 g/dL (ref 12.0–15.0)
MCH: 31.5 pg (ref 26.0–34.0)
MCHC: 34.8 g/dL (ref 30.0–36.0)
MCV: 90.7 fL (ref 78.0–100.0)
PLATELETS: 248 10*3/uL (ref 150–400)
RBC: 4.41 MIL/uL (ref 3.87–5.11)
RDW: 12.2 % (ref 11.5–15.5)
WBC: 5.5 10*3/uL (ref 4.0–10.5)

## 2014-05-22 LAB — HEPATIC FUNCTION PANEL
ALK PHOS: 47 U/L (ref 39–117)
ALT: 11 U/L (ref 0–35)
AST: 18 U/L (ref 0–37)
Albumin: 4.7 g/dL (ref 3.5–5.2)
BILIRUBIN DIRECT: 0.1 mg/dL (ref 0.0–0.5)
BILIRUBIN INDIRECT: 0.7 mg/dL (ref 0.3–0.9)
Total Bilirubin: 0.8 mg/dL (ref 0.3–1.2)
Total Protein: 6.8 g/dL (ref 6.0–8.3)

## 2014-05-22 LAB — TROPONIN I

## 2014-05-22 LAB — LIPASE, BLOOD: Lipase: 36 U/L (ref 11–59)

## 2014-05-22 LAB — I-STAT TROPONIN, ED: Troponin i, poc: 0 ng/mL (ref 0.00–0.08)

## 2014-05-22 MED ORDER — LORAZEPAM 2 MG/ML IJ SOLN
0.5000 mg | Freq: Once | INTRAMUSCULAR | Status: AC
  Administered 2014-05-22: 0.5 mg via INTRAVENOUS
  Filled 2014-05-22: qty 1

## 2014-05-22 MED ORDER — SODIUM CHLORIDE 0.9 % IV SOLN: 500.0000 mL | INTRAVENOUS | Status: DC

## 2014-05-22 MED ORDER — SODIUM CHLORIDE 0.9 % IV BOLUS (SEPSIS)
1000.0000 mL | Freq: Once | INTRAVENOUS | Status: AC
  Administered 2014-05-22: 1000 mL via INTRAVENOUS

## 2014-05-22 NOTE — ED Notes (Addendum)
Pt woke up at 0300 with sudden onset abdominal pain and feeling like her heart was beating rapidly. Pt also reports dry mouth and chest tightness. Pt is slightly diaphoretic. Pt is supposed to have an endoscopy at 0900 this AM at [facility]. Pt with hx of hiatal hernia. Pt denies abd pain at this time.

## 2014-05-22 NOTE — Op Note (Signed)
[place]  [address] [place], [address]   ENDOSCOPY PROCEDURE REPORT  PATIENT: Brianna Harper, Brianna Harper  MR#: [id number] BIRTHDATE: 06/19/1945 , 68  yrs. old GENDER: female ENDOSCOPIST: Gatha Mayer, MD, [facility] PROCEDURE DATE:  05/22/2014 PROCEDURE:  EGD, diagnostic ASA CLASS:     Class II INDICATIONS:  GERD?, cough, chest pain. MEDICATIONS: Propofol 200 mg IV and Monitored anesthesia care TOPICAL ANESTHETIC: none  DESCRIPTION OF PROCEDURE: After the risks benefits and alternatives of the procedure were thoroughly explained, informed consent was obtained.  The LB [id number] endoscope was introduced through the mouth and advanced to the second portion of the duodenum , Without limitations.  The instrument was slowly withdrawn as the mucosa was fully examined.      EXAM: The esophagus and gastroesophageal junction were completely normal in appearance. Small 2 cm hiatal hernia.  The stomach was entered and closely examined.The antrum, angularis, and lesser curvature were well visualized, including a retroflexed view of the cardia and fundus.  The stomach wall was normally distensable.  The scope passed easily through the pylorus into the duodenum. Retroflexed views revealed no abnormalities.     The scope was then withdrawn from the patient and the procedure completed.  COMPLICATIONS: There were no immediate complications.  ENDOSCOPIC IMPRESSION: 2 cm hiatal hernia Otherwise normal appearing esophagus and GE junction, the stomach was well visualized and normal in appearance, normal appearing duodenum -  RECOMMENDATIONS: Doubt reflux is her problem - will confer with Dr. Regis Bill re: next steps    eSigned:  Gatha Mayer, MD, [facility] 05/22/2014 10:51 AM    CC: The Patient, Shanon Ace, MD

## 2014-05-22 NOTE — ED Provider Notes (Signed)
CSN: [id number]     Arrival date & time 05/22/14  [id number] History   First MD Initiated Contact with Patient 05/22/14 [phone]     Chief Complaint  Patient presents with  . Tachycardia     (Consider location/radiation/quality/duration/timing/severity/associated sxs/prior Treatment) HPI 69 year old female presents to emergency department from home with complaint of palpitations, shortness of breath and abdominal pain.  Patient reports she woke this morning with sharp abdominal pain and noticed that her heart was racing.  Patient also complaining of dry mouth and chest tightness.  She reports over the last several weeks she has had constant epigastric subxiphoid pain.  She has an endoscopy scheduled for 9:00 this morning for further evaluation.  She has history of hiatal hernia.  Patient with cough for the last 2 months, but be due to lisinopril.  Patient has changed her blood pressure medicine, but is still noticed some spikes in her blood pressure from time to time.  Patient also recently seen by ENT due to cough and a globus sensation.  She had some inflammation of her vocal cords thought to be due to reflux.  Patient reports she had similar episode in  December which she was admitted, no specific diagnosis was obtained at that time. Past Medical History  Diagnosis Date  . Anxiety   . Hyperlipidemia   . GERD (gastroesophageal reflux disease)   . Osteopenia     dexa -2.0 hip 2008  . Rapid heart rate     hx of   . Laryngitis   . Hypertension   . Migraine     "not very often anymore; they were more menopausal" (04/16/2013)  . Depression    Past Surgical History  Procedure Laterality Date  . Colonoscopy  02/03/2004    internal hemorrhoids  . Upper gastrointestinal endoscopy  08/29/2008    gastritis, hiatal hernia   Family History  Problem Relation Age of Onset  . Hypertension Mother   . Migraines Mother   . Arthritis Father   . Ovarian cancer Maternal Grandmother   . Seizures Neg Hx   .  Esophageal cancer Neg Hx   . Colon cancer Neg Hx    History  Substance Use Topics  . Smoking status: Never Smoker   . Smokeless tobacco: Never Used  . Alcohol Use: No   OB History    No data available     Review of Systems   See History of Present Illness; otherwise all other systems are reviewed and negative  Allergies  Lisinopril; Erythromycin ethylsuccinate; Nitrofurantoin; and Sulfadiazine  Home Medications   Prior to Admission medications   Medication Sig Start Date End Date Taking? Authorizing Provider  chlorpheniramine-HYDROcodone (TUSSIONEX) 10-8 MG/5ML LQCR Take 5 mLs by mouth at bedtime as needed for cough. 05/09/14  Yes Gatha Mayer, MD  chlorthalidone (HYGROTON) 25 MG tablet Take 0.5 tablets (12.5 mg total) by mouth daily. 05/20/14  Yes Burnis Medin, MD  lidocaine (XYLOCAINE) 2 % solution Use as directed 10 mLs in the mouth or throat as needed for mouth pain (5-10 cc before meals as neded for abdominal pain). 05/17/14  Yes Gatha Mayer, MD  omeprazole (PRILOSEC) 20 MG capsule Take 20 mg by mouth daily before supper.   Yes Historical Provider, MD  omeprazole (PRILOSEC) 40 MG capsule Take 40 mg by mouth daily before breakfast.    Yes Historical Provider, MD  verapamil (CALAN-SR) 240 MG CR tablet Take 1 tablet (240 mg total) by mouth at bedtime. 05/20/14  Yes Burnis Medin, MD  aluminum hydroxide-magnesium carbonate (GAVISCON) 95-358 MG/15ML SUSP Take 15 mLs by mouth as needed for indigestion or heartburn. Patient not taking: Reported on 05/17/2014 05/07/14   Gatha Mayer, MD  cholecalciferol (VITAMIN D) 1000 UNITS tablet Take 2,000 Units by mouth daily.    Historical Provider, MD  Multiple Vitamin (MULTIVITAMIN WITH MINERALS) TABS tablet Take 1 tablet by mouth daily.    Historical Provider, MD   BP 128/62 mmHg  Pulse 89  Temp(Src) 97.5 F (36.4 C) (Oral)  Resp 14  Ht 5\' 6"  (1.676 m)  Wt 129 lb (58.514 kg)  BMI 20.83 kg/m2  SpO2 100% Physical Exam   Constitutional: She is oriented to person, place, and time. She appears well-developed and well-nourished. She appears distressed (patient is extremely anxious).  HENT:  Head: Normocephalic and atraumatic.  Nose: Nose normal.  Mouth/Throat: Oropharynx is clear and moist.  Dry mucous membranes  Eyes: Conjunctivae and EOM are normal. Pupils are equal, round, and reactive to light.  Neck: Normal range of motion. Neck supple. No JVD present. No tracheal deviation present. No thyromegaly present.  Cardiovascular: Regular rhythm, normal heart sounds and intact distal pulses.  Exam reveals no gallop and no friction rub.   No murmur heard. Tachycardia noted  Pulmonary/Chest: Effort normal and breath sounds normal. No stridor. No respiratory distress. She has no wheezes. She has no rales. She exhibits tenderness.  Abdominal: Soft. Bowel sounds are normal. She exhibits no distension and no mass. There is tenderness. There is no rebound and no guarding.  She has tenderness to the lower end of her xiphoid and the epigastrium  Musculoskeletal: Normal range of motion. She exhibits no edema or tenderness.  Lymphadenopathy:    She has no cervical adenopathy.  Neurological: She is alert and oriented to person, place, and time. She displays normal reflexes. No cranial nerve deficit. She exhibits normal muscle tone. Coordination normal.  Skin: Skin is warm and dry. No rash noted. No erythema. No pallor.  Psychiatric: She has a normal mood and affect. Her behavior is normal. Judgment and thought content normal.  Nursing note and vitals reviewed.   ED Course  Procedures (including critical care time) Labs Review Labs Reviewed  BASIC METABOLIC PANEL - Abnormal; Notable for the following:    Potassium 3.0 (*)    Glucose, Bld 136 (*)    GFR calc non Af Amer 59 (*)    GFR calc Af Amer 69 (*)    Anion gap 17 (*)    All other components within normal limits  CBC  LIPASE, BLOOD  TROPONIN I  HEPATIC  FUNCTION PANEL  Randolm Idol, ED    Imaging Review Dg Chest Port 1 View  05/22/2014   CLINICAL DATA:  Shortness of breath, tachycardia.  EXAM: PORTABLE CHEST - 1 VIEW  COMPARISON:  Chest radiograph April 26, 2014  FINDINGS: Cardiomediastinal silhouette is unremarkable. The lungs are clear without pleural effusions or focal consolidations. Trachea projects midline and there is no pneumothorax. Soft tissue planes and included osseous structures are non-suspicious. Multiple EKG lines overlie the patient and may obscure subtle underlying pathology.  IMPRESSION: Normal chest.   Electronically Signed   By: Elon Alas   On: 05/22/2014 04:38     EKG Interpretation   Date/Time:  Wednesday May 22 2014 03:49:01 EST Ventricular Rate:  127 PR Interval:  135 QRS Duration: 90 QT Interval:  362 QTC Calculation: 526 R Axis:   99 Text Interpretation:  Sinus tachycardia Probable left atrial enlargement  Right axis deviation Borderline ST depression, diffuse leads Prolonged QT  interval Since last tracing rate faster Confirmed by Sherron Mapp  MD, Efosa Treichler  ([id number]) on 05/22/2014 3:59:55 AM      MDM   Final diagnoses:  SOB (shortness of breath)  Atypical chest pain  Sinus tachycardia    69 year old female with acute onset of abdominal pain, tachycardia, shortness of breath.  Workup here unremarkable.  After Ativan, heart rate trended down.  Patient has follow-up with gastroenterology later this morning.  Patient given advice on reducing stress.    Kalman Drape, MD 05/22/14 [phone]

## 2014-05-22 NOTE — Patient Instructions (Addendum)
The exam was normal except for the small hiatal hernia as before.  I do not think reflux is your problem.  I need to review things with Dr. Regis Bill and I/we will get back to you.  I appreciate the opportunity to care for you. Gatha Mayer, MD, [facility]   Discharge instructions given. Handout on a hiatal hernia. Resume previous medications. YOU HAD AN ENDOSCOPIC PROCEDURE TODAY AT [facility]: Refer to the procedure report that was given to you for any specific questions about what was found during the examination.  If the procedure report does not answer your questions, please call your gastroenterologist to clarify.  If you requested that your care partner not be given the details of your procedure findings, then the procedure report has been included in a sealed envelope for you to review at your convenience later.  YOU SHOULD EXPECT: Some feelings of bloating in the abdomen. Passage of more gas than usual.  Walking can help get rid of the air that was put into your GI tract during the procedure and reduce the bloating. If you had a lower endoscopy (such as a colonoscopy or flexible sigmoidoscopy) you may notice spotting of blood in your stool or on the toilet paper. If you underwent a bowel prep for your procedure, then you may not have a normal bowel movement for a few days.  DIET: Your first meal following the procedure should be a light meal and then it is ok to progress to your normal diet.  A half-sandwich or bowl of soup is an example of a good first meal.  Heavy or fried foods are harder to digest and may make you feel nauseous or bloated.  Likewise meals heavy in dairy and vegetables can cause extra gas to form and this can also increase the bloating.  Drink plenty of fluids but you should avoid alcoholic beverages for 24 hours.  ACTIVITY: Your care partner should take you home directly after the procedure.  You should plan to take it easy, moving slowly for the  rest of the day.  You can resume normal activity the day after the procedure however you should NOT DRIVE or use heavy machinery for 24 hours (because of the sedation medicines used during the test).    SYMPTOMS TO REPORT IMMEDIATELY: A gastroenterologist can be reached at any hour.  During normal business hours, 8:30 AM to 5:00 PM Monday through Friday, call [phone].  After hours and on weekends, please call the GI answering service at [phone] who will take a message and have the physician on call contact you.   Following upper endoscopy (EGD)  Vomiting of blood or coffee ground material  New chest pain or pain under the shoulder blades  Painful or persistently difficult swallowing  New shortness of breath  Fever of 100F or higher  Black, tarry-looking stools  FOLLOW UP: If any biopsies were taken you will be contacted by phone or by letter within the next 1-3 weeks.  Call your gastroenterologist if you have not heard about the biopsies in 3 weeks.  Our staff will call the home number listed on your records the next business day following your procedure to check on you and address any questions or concerns that you may have at that time regarding the information given to you following your procedure. This is a courtesy call and so if there is no answer at the home number and we have not heard from you  through the emergency physician on call, we will assume that you have returned to your regular daily activities without incident.  SIGNATURES/CONFIDENTIALITY: You and/or your care partner have signed paperwork which will be entered into your electronic medical record.  These signatures attest to the fact that that the information above on your After Visit Summary has been reviewed and is understood.  Full responsibility of the confidentiality of this discharge information lies with you and/or your care-partner.

## 2014-05-22 NOTE — ED Notes (Signed)
Otter, MD at bedside.  

## 2014-05-22 NOTE — Progress Notes (Signed)
Report to PACU, RN, vss, BBS= Clear.  

## 2014-05-22 NOTE — Discharge Instructions (Signed)
Your workup tonight has not shown any serious abnormalities.  Follow-up with gastroenterology as scheduled later this morning.  Continue taking medications as prescribed.  We discussed trying to lower your stress levels through meditation, exercise or other stress relieving activities.   Chest Pain (Nonspecific) It is often hard to give a specific diagnosis for the cause of chest pain. There is always a chance that your pain could be related to something serious, such as a heart attack or a blood clot in the lungs. You need to follow up with your health care provider for further evaluation. CAUSES   Heartburn.  Pneumonia or bronchitis.  Anxiety or stress.  Inflammation around your heart (pericarditis) or lung (pleuritis or pleurisy).  A blood clot in the lung.  A collapsed lung (pneumothorax). It can develop suddenly on its own (spontaneous pneumothorax) or from trauma to the chest.  Shingles infection (herpes zoster virus). The chest wall is composed of bones, muscles, and cartilage. Any of these can be the source of the pain.  The bones can be bruised by injury.  The muscles or cartilage can be strained by coughing or overwork.  The cartilage can be affected by inflammation and become sore (costochondritis). DIAGNOSIS  Lab tests or other studies may be needed to find the cause of your pain. Your health care provider may have you take a test called an ambulatory electrocardiogram (ECG). An ECG records your heartbeat patterns over a 24-hour period. You may also have other tests, such as:  Transthoracic echocardiogram (TTE). During echocardiography, sound waves are used to evaluate how blood flows through your heart.  Transesophageal echocardiogram (TEE).  Cardiac monitoring. This allows your health care provider to monitor your heart rate and rhythm in real time.  Holter monitor. This is a portable device that records your heartbeat and can help diagnose heart arrhythmias. It  allows your health care provider to track your heart activity for several days, if needed.  Stress tests by exercise or by giving medicine that makes the heart beat faster. TREATMENT   Treatment depends on what may be causing your chest pain. Treatment may include:  Acid blockers for heartburn.  Anti-inflammatory medicine.  Pain medicine for inflammatory conditions.  Antibiotics if an infection is present.  You may be advised to change lifestyle habits. This includes stopping smoking and avoiding alcohol, caffeine, and chocolate.  You may be advised to keep your head raised (elevated) when sleeping. This reduces the chance of acid going backward from your stomach into your esophagus. Most of the time, nonspecific chest pain will improve within 2-3 days with rest and mild pain medicine.  HOME CARE INSTRUCTIONS   If antibiotics were prescribed, take them as directed. Finish them even if you start to feel better.  For the next few days, avoid physical activities that bring on chest pain. Continue physical activities as directed.  Do not use any tobacco products, including cigarettes, chewing tobacco, or electronic cigarettes.  Avoid drinking alcohol.  Only take medicine as directed by your health care provider.  Follow your health care provider's suggestions for further testing if your chest pain does not go away.  Keep any follow-up appointments you made. If you do not go to an appointment, you could develop lasting (chronic) problems with pain. If there is any problem keeping an appointment, call to reschedule. SEEK MEDICAL CARE IF:   Your chest pain does not go away, even after treatment.  You have a rash with blisters on your chest.  You have a fever. SEEK IMMEDIATE MEDICAL CARE IF:   You have increased chest pain or pain that spreads to your arm, neck, jaw, back, or abdomen.  You have shortness of breath.  You have an increasing cough, or you cough up blood.  You  have severe back or abdominal pain.  You feel nauseous or vomit.  You have severe weakness.  You faint.  You have chills. This is an emergency. Do not wait to see if the pain will go away. Get medical help at once. Call your local emergency services (911 in [place] Do not drive yourself to the hospital. MAKE SURE YOU:   Understand these instructions.  Will watch your condition.  Will get help right away if you are not doing well or get worse. Document Released: 12/23/2004 Document Revised: 03/20/2013 Document Reviewed: 10/19/2007 [facility] Patient Information 2015 [place], [place]. This information is not intended to replace advice given to you by your health care provider. Make sure you discuss any questions you have with your health care provider.  Shortness of Breath Shortness of breath means you have trouble breathing. Shortness of breath needs medical care right away. HOME CARE   Do not smoke.  Avoid being around chemicals or things (paint fumes, dust) that may bother your breathing.  Rest as needed. Slowly begin your normal activities.  Only take medicines as told by your doctor.  Keep all doctor visits as told. GET HELP RIGHT AWAY IF:   Your shortness of breath gets worse.  You feel lightheaded, pass out (faint), or have a cough that is not helped by medicine.  You cough up blood.  You have pain with breathing.  You have pain in your chest, arms, shoulders, or belly (abdomen).  You have a fever.  You cannot walk up stairs or exercise the way you normally do.  You do not get better in the time expected.  You have a hard time doing normal activities even with rest.  You have problems with your medicines.  You have any new symptoms. MAKE SURE YOU:  Understand these instructions.  Will watch your condition.  Will get help right away if you are not doing well or get worse. Document Released: 09/01/2007 Document Revised: 03/20/2013 Document Reviewed:  05/31/2011 [facility] Patient Information 2015 [place], [place]. This information is not intended to replace advice given to you by your health care provider. Make sure you discuss any questions you have with your health care provider.

## 2014-05-23 ENCOUNTER — Telehealth: Payer: Self-pay | Admitting: *Deleted

## 2014-05-23 NOTE — Telephone Encounter (Signed)
  Follow up Call-  Call back number 05/22/2014  Post procedure Call Back phone  # [phone]  Permission to leave phone message Yes    [facility]

## 2014-05-24 ENCOUNTER — Other Ambulatory Visit: Payer: Self-pay | Admitting: Family Medicine

## 2014-05-24 ENCOUNTER — Telehealth: Payer: Self-pay | Admitting: Internal Medicine

## 2014-05-24 ENCOUNTER — Encounter: Payer: Self-pay | Admitting: Internal Medicine

## 2014-05-24 DIAGNOSIS — F411 Generalized anxiety disorder: Secondary | ICD-10-CM

## 2014-05-24 MED ORDER — BUSPIRONE HCL 10 MG PO TABS: 10.0000 mg | ORAL_TABLET | Freq: Two times a day (BID) | ORAL | Status: DC

## 2014-05-24 MED ORDER — CHLORTHALIDONE 25 MG PO TABS: 12.5000 mg | ORAL_TABLET | Freq: Every day | ORAL | Status: DC

## 2014-05-24 NOTE — Telephone Encounter (Signed)
My Chart message re: next steps This encounter was created in error - please disregard.

## 2014-05-24 NOTE — Telephone Encounter (Addendum)
[facility]ist , [place] @ [phone] Ext [phone]  (direct line) Pt had [facility] called to clarify rx verapamil (CALAN-SR) 240 MG CR tablet Pt thought she was going to get a 24 hr tab  verapamil (VERELAN PM) 240 MG 24 hr capsule  Do you want to resend rx w/ this med   Pharm would like someone to call w/ a verbal of this med if posible

## 2014-05-26 ENCOUNTER — Encounter: Payer: Self-pay | Admitting: Internal Medicine

## 2014-05-27 ENCOUNTER — Telehealth: Payer: Self-pay | Admitting: Internal Medicine

## 2014-05-27 NOTE — Telephone Encounter (Signed)
All questions answered. Procedure report faxed to Dr. Carol Ada (ENT) at [facility] [phone] at the patient's request

## 2014-05-27 NOTE — Telephone Encounter (Signed)
Pharm following up on med request.  Pt would like to hear assurance from dr Regis Bill that it is ok for her to take the verapamil (CALAN-SR) 240 MG CR tablet  The last rx pt got in  December was the verapamil (VERELAN PM) 240 MG 24 hr capsule.,  Les would like a cb

## 2014-05-28 ENCOUNTER — Other Ambulatory Visit: Payer: Self-pay | Admitting: Family Medicine

## 2014-05-28 MED ORDER — VERAPAMIL HCL ER 240 MG PO CP24: 240.0000 mg | ORAL_CAPSULE | Freq: Every day | ORAL | Status: DC

## 2014-05-28 NOTE — Telephone Encounter (Signed)
Les called back to inform me that the Calan-SR is NOT a 24 hour tablet and that it will need to be changed to Verelan PM capsule.  Pt wants 24 hour.  Authorized change with the same amount of medication and refills as sent in before for Calan-SR.

## 2014-05-28 NOTE — Telephone Encounter (Signed)
Left a message for Brianna Harper informing him that the pt should be taking the verapamil (CALAN-SR) 240 MG CR tablet (24 hour).  Believed that another physicians office sent in the [place] he call back if he has any further questions.  Left telephone number with area code.

## 2014-05-29 DIAGNOSIS — J387 Other diseases of larynx: Secondary | ICD-10-CM | POA: Diagnosis not present

## 2014-05-29 DIAGNOSIS — F458 Other somatoform disorders: Secondary | ICD-10-CM | POA: Diagnosis not present

## 2014-05-29 DIAGNOSIS — I10 Essential (primary) hypertension: Secondary | ICD-10-CM | POA: Diagnosis not present

## 2014-05-29 DIAGNOSIS — Z888 Allergy status to other drugs, medicaments and biological substances status: Secondary | ICD-10-CM | POA: Diagnosis not present

## 2014-05-29 DIAGNOSIS — Z881 Allergy status to other antibiotic agents status: Secondary | ICD-10-CM | POA: Diagnosis not present

## 2014-05-29 DIAGNOSIS — R05 Cough: Secondary | ICD-10-CM | POA: Diagnosis not present

## 2014-05-29 DIAGNOSIS — Z882 Allergy status to sulfonamides status: Secondary | ICD-10-CM | POA: Diagnosis not present

## 2014-05-29 NOTE — Progress Notes (Unsigned)
Pt has tried miralax for 1 wk no relief

## 2014-05-31 ENCOUNTER — Telehealth: Payer: Self-pay | Admitting: Internal Medicine

## 2014-05-31 NOTE — Telephone Encounter (Signed)
[facility] - Client [place] [facility] Patient Name: Brianna Harper DOB: [birth year] Initial Comment Caller states having side effect to medication. Constipation. Nurse Assessment Nurse: Mechele Dawley, RN, Amy Date/Time Eilene Ghazi Time): 05/31/2014 5:04:50 PM Confirm and document reason for call. If symptomatic, describe symptoms. ---SHE IS HAVING SOME SIDE EFFECTS TO HER MEDICATIONS. SHE CALLED INTO THE OFFICES ON WEDNESDAY AND NOW TO FRIDAY AND THIS IS WHY SHE HAS CALLED. SHE IS ON VERAPAMIL 240 MG. SHE IS HAVING SOME CONSTIPATION. SHE HAS BEEN DOING MIRALAX FOR 7 NIGHTS. THURSDAY MORNING HAD A DIARRHEA STOOL. SHE HAS NOT HAD ANYTHING SINCE. SHE IS TAKING ONE CAPSULE IN 8 OUNCES OF WATER EXCEPT SHE DID 2 CAPFULS. SHE FINISHED UP THE BOTTLE. SHE HAD TRIED STOOL SOFTENERS IN THE MORNING AND THIS HAD DONE NOTHING FOR HER. SHE HAS TRIED TO DO 6 OUNCES OF PRUNE JUICE IN THE MORNING. SHE IS ALSO DOING RAISIN BRAN IN THE MORNING AND AN APPLE DURING THE DAY. SHE ALSO HAS ACID REFLUX AS WELL. 8 DAYS PRIOR BEFORE HAVING A SUCCESSFUL BM. Has the patient traveled out of the country within the last 30 days? ---Not Applicable Does the patient require triage? ---Yes Related visit to physician within the last 2 weeks? ---No Does the PT have any chronic conditions? (i.e. diabetes, asthma, etc.) ---Yes List chronic conditions. ---HTN, ACID REFULX Guidelines Guideline Title Affirmed Question Affirmed Notes Constipation Last bowel movement (BM) > 4 days ago Final Disposition User See Physician within [place], Therapist, sports, [place]

## 2014-06-01 ENCOUNTER — Encounter: Payer: Self-pay | Admitting: Family Medicine

## 2014-06-01 ENCOUNTER — Ambulatory Visit (INDEPENDENT_AMBULATORY_CARE_PROVIDER_SITE_OTHER): Payer: Medicare Other | Admitting: Family Medicine

## 2014-06-01 VITALS — BP 132/92 | HR 104 | Temp 98.3°F | Resp 20 | Ht 66.0 in | Wt 129.1 lb

## 2014-06-01 DIAGNOSIS — I10 Essential (primary) hypertension: Secondary | ICD-10-CM

## 2014-06-01 DIAGNOSIS — K219 Gastro-esophageal reflux disease without esophagitis: Secondary | ICD-10-CM

## 2014-06-01 DIAGNOSIS — K5900 Constipation, unspecified: Secondary | ICD-10-CM

## 2014-06-01 NOTE — Patient Instructions (Addendum)
Encouraged increased hydration and fiber in diet, Psyllium powder daily. Daily probiotics such as [employer] or [employer]. If bowels not moving can use MOM 2 tbls po in 4 oz of warm prune juice by mouth every 2-3 days. If no results then repeat in 4 to 6 hours with  Dulcolax suppository pr, may repeat again in 4 to 6 more hours as needed. Seek care if symptoms worsen. Consider daily Miralax and/or Dulcolax stool softener  if symptoms persist.    Constipation Constipation is when a person has fewer than three bowel movements a week, has difficulty having a bowel movement, or has stools that are dry, hard, or larger than normal. As people grow older, constipation is more common. If you try to fix constipation with medicines that make you have a bowel movement (laxatives), the problem may get worse. Long-term laxative use may cause the muscles of the colon to become weak. A low-fiber diet, not taking in enough fluids, and taking certain medicines may make constipation worse.  CAUSES   Certain medicines, such as antidepressants, pain medicine, iron supplements, antacids, and water pills.   Certain diseases, such as diabetes, irritable bowel syndrome (IBS), thyroid disease, or depression.   Not drinking enough water.   Not eating enough fiber-rich foods.   Stress or travel.   Lack of physical activity or exercise.   Ignoring the urge to have a bowel movement.   Using laxatives too much.  SIGNS AND SYMPTOMS   Having fewer than three bowel movements a week.   Straining to have a bowel movement.   Having stools that are hard, dry, or larger than normal.   Feeling full or bloated.   Pain in the lower abdomen.   Not feeling relief after having a bowel movement.  DIAGNOSIS  Your health care provider will take a medical history and perform a physical exam. Further testing may be done for severe constipation. Some tests may include:  A barium enema X-ray  to examine your rectum, colon, and, sometimes, your small intestine.   A sigmoidoscopy to examine your lower colon.   A colonoscopy to examine your entire colon. TREATMENT  Treatment will depend on the severity of your constipation and what is causing it. Some dietary treatments include drinking more fluids and eating more fiber-rich foods. Lifestyle treatments may include regular exercise. If these diet and lifestyle recommendations do not help, your health care provider may recommend taking over-the-counter laxative medicines to help you have bowel movements. Prescription medicines may be prescribed if over-the-counter medicines do not work.  HOME CARE INSTRUCTIONS   Eat foods that have a lot of fiber, such as fruits, vegetables, whole grains, and beans.  Limit foods high in fat and processed sugars, such as french fries, hamburgers, cookies, candies, and soda.   A fiber supplement may be added to your diet if you cannot get enough fiber from foods.   Drink enough fluids to keep your urine clear or pale yellow.   Exercise regularly or as directed by your health care provider.   Go to the restroom when you have the urge to go. Do not hold it.   Only take over-the-counter or prescription medicines as directed by your health care provider. Do not take other medicines for constipation without talking to your health care provider first.  [place] IF:   You have bright red blood in your stool.   Your constipation lasts for more than 4 days or gets worse.  You have abdominal or rectal pain.   You have thin, pencil-like stools.   You have unexplained weight loss. MAKE SURE YOU:   Understand these instructions.  Will watch your condition.  Will get help right away if you are not doing well or get worse. Document Released: 12/12/2003 Document Revised: 03/20/2013 Document Reviewed: 12/25/2012 [facility] Patient Information 2015 [place], [place]. This  information is not intended to replace advice given to you by your health care provider. Make sure you discuss any questions you have with your health care provider.

## 2014-06-01 NOTE — Progress Notes (Signed)
Pre visit review using our clinic review tool, if applicable. No additional management support is needed unless otherwise documented below in the visit note. 

## 2014-06-02 ENCOUNTER — Encounter: Payer: Self-pay | Admitting: Family Medicine

## 2014-06-02 NOTE — Progress Notes (Signed)
Brianna Harper  [id number] 1945/08/06 06/02/2014      Progress Note-Follow Up  Subjective  Chief Complaint  Chief Complaint  Patient presents with  . Constipation    HPI  Patient is a 69 y.o. female in today for routine medical care. Patient is in today accompanied by her husband. She has been struggling with constipation for nearly a month. She has only 2 watery stool in past week, moved 2 tbls spoons of watery stool last night, no bloody or tarry stoo. Some mild nausea but not vomiting. No anorexia or significant abdominal or back pain. No f/c. She has tried Miralax for 7 straight days, has tried phillips MOM, stool softeners with. She had a recent episode of tachycardia on 2/24 that took her to ER. Has not occurred again. She was noted to have a low potassium at that time but has since stopped HCTZ. No CP/palp/SOB/GU c/o.  Past Medical History  Diagnosis Date  . Anxiety   . Hyperlipidemia   . GERD (gastroesophageal reflux disease)   . Osteopenia     dexa -2.0 hip 2008  . Rapid heart rate     hx of   . Laryngitis   . Hypertension   . Migraine     "not very often anymore; they were more menopausal" (04/16/2013)  . Depression     Past Surgical History  Procedure Laterality Date  . Colonoscopy  02/03/2004    internal hemorrhoids  . Upper gastrointestinal endoscopy  08/29/2008    gastritis, hiatal hernia    Family History  Problem Relation Age of Onset  . Hypertension Mother   . Migraines Mother   . Arthritis Father   . Ovarian cancer Maternal Grandmother   . Seizures Neg Hx   . Esophageal cancer Neg Hx   . Colon cancer Neg Hx     History   Social History  . Marital Status: Married    Spouse Name: N/A  . Number of Children: 2  . Years of Education: college BS   Occupational History  . retired    Social History Main Topics  . Smoking status: Never Smoker   . Smokeless tobacco: Never Used  . Alcohol Use: No  . Drug Use: No  . Sexual Activity: Yes    Other Topics Concern  . Not on file   Social History Narrative   HHof 2    Non smoker   2 children  Also twins that did not survive birth   Married    CB x 2   No pets     Current Outpatient Prescriptions on File Prior to Visit  Medication Sig Dispense Refill  . cholecalciferol (VITAMIN D) 1000 UNITS tablet Take 2,000 Units by mouth daily.    Marland Kitchen lidocaine (XYLOCAINE) 2 % solution Use as directed 10 mLs in the mouth or throat as needed for mouth pain (5-10 cc before meals as neded for abdominal pain). 100 mL 0  . omeprazole (PRILOSEC) 20 MG capsule Take 20 mg by mouth daily before supper.    Marland Kitchen omeprazole (PRILOSEC) 40 MG capsule Take 40 mg by mouth daily before breakfast.     . verapamil (VERELAN) 240 MG 24 hr capsule Take 1 capsule (240 mg total) by mouth at bedtime. 90 capsule 0  . Multiple Vitamin (MULTIVITAMIN WITH MINERALS) TABS tablet Take 1 tablet by mouth daily.     No current facility-administered medications on file prior to visit.    Allergies  Allergen Reactions  .  Lisinopril Cough  . Erythromycin Ethylsuccinate Other (See Comments)    REACTION: GI upset  . Nitrofurantoin Hives and Swelling    macrobid   . Sulfadiazine Itching and Rash    Review of Systems  Review of Systems  Constitutional: Negative for fever and malaise/fatigue.  HENT: Negative for congestion.   Eyes: Negative for discharge.  Respiratory: Positive for cough. Negative for shortness of breath.   Cardiovascular: Positive for palpitations. Negative for chest pain and leg swelling.  Gastrointestinal: Positive for constipation. Negative for nausea, abdominal pain, diarrhea, blood in stool and melena.  Genitourinary: Negative for dysuria.  Musculoskeletal: Negative for falls.  Skin: Negative for rash.  Neurological: Negative for loss of consciousness and headaches.  Endo/Heme/Allergies: Negative for polydipsia.  Psychiatric/Behavioral: Negative for depression and suicidal ideas. The patient is  not nervous/anxious and does not have insomnia.     Objective  BP 132/92 mmHg  Pulse 104  Temp(Src) 98.3 F (36.8 C) (Oral)  Resp 20  Ht 5\' 6"  (1.676 m)  Wt 129 lb 1.3 oz (58.55 kg)  BMI 20.84 kg/m2  SpO2 98%  Physical Exam  Physical Exam  Constitutional: She is oriented to person, place, and time and well-developed, well-nourished, and in no distress. No distress.  HENT:  Head: Normocephalic and atraumatic.  Eyes: Conjunctivae are normal.  Neck: Neck supple. No thyromegaly present.  Cardiovascular: Normal rate, regular rhythm and normal heart sounds.   No murmur heard. Pulmonary/Chest: Effort normal and breath sounds normal. She has no wheezes.  Abdominal: She exhibits no distension and no mass.  Musculoskeletal: She exhibits no edema.  Lymphadenopathy:    She has no cervical adenopathy.  Neurological: She is alert and oriented to person, place, and time.  Skin: Skin is warm and dry. No rash noted. She is not diaphoretic.  Psychiatric: Memory, affect and judgment normal.    Lab Results  Component Value Date   TSH 1.87 03/09/2013   Lab Results  Component Value Date   WBC 5.5 05/22/2014   HGB 13.9 05/22/2014   HCT 40.0 05/22/2014   MCV 90.7 05/22/2014   PLT 248 05/22/2014   Lab Results  Component Value Date   CREATININE 0.97 05/22/2014   BUN 13 05/22/2014   NA 137 05/22/2014   K 3.0* 05/22/2014   CL 99 05/22/2014   CO2 21 05/22/2014   Lab Results  Component Value Date   ALT 11 05/22/2014   AST 18 05/22/2014   ALKPHOS 47 05/22/2014   BILITOT 0.8 05/22/2014   Lab Results  Component Value Date   CHOL 202* 04/16/2013   Lab Results  Component Value Date   HDL 69 04/16/2013   Lab Results  Component Value Date   LDLCALC 117* 04/16/2013   Lab Results  Component Value Date   TRIG 79 04/16/2013   Lab Results  Component Value Date   CHOLHDL 2.9 04/16/2013     Assessment & Plan  Essential hypertension Has been improved with Verapamil  adjustment but unfortunately has developed constipation. No change to bp meds for now. May need to consider in future if constipation does not remit. Did stop HCTZ and has had no more palpitations   Constipation Encouraged increased hydration and fiber in diet. Daily probiotics. If bowels not moving can use MOM 2 tbls po in 4 oz of warm prune juice by mouth every 2-3 days. If no results then repeat in 4 hours with  Dulcolax suppository pr, may repeat again in 4 more hours as  needed. Seek care if symptoms worsen. Consider daily Miralax and/or Dulcolax if symptoms persist. Counseled patient regarding need to be consistent with approach for greater than 25 minutes then if no response will need to seek care or notify us and come back in. Is referred todayf or colonoscopy since she had her last colonoscopy in 2005   GERD Avoid offending foods, start probiotics. Do not eat large meals in late evening and consider raising head of bed. Has recently had a EGD which showed no esophageal damage

## 2014-06-02 NOTE — Assessment & Plan Note (Signed)
Avoid offending foods, start probiotics. Do not eat large meals in late evening and consider raising head of bed. Has recently had a EGD which showed no esophageal damage

## 2014-06-02 NOTE — Assessment & Plan Note (Addendum)
Has been improved with Verapamil adjustment but unfortunately has developed constipation. No change to bp meds for now. May need to consider in future if constipation does not remit. Did stop HCTZ and has had no more palpitations

## 2014-06-02 NOTE — Assessment & Plan Note (Signed)
Encouraged increased hydration and fiber in diet. Daily probiotics. If bowels not moving can use MOM 2 tbls po in 4 oz of warm prune juice by mouth every 2-3 days. If no results then repeat in 4 hours with  Dulcolax suppository pr, may repeat again in 4 more hours as needed. Seek care if symptoms worsen. Consider daily Miralax and/or Dulcolax if symptoms persist. Counseled patient regarding need to be consistent with approach for greater than 25 minutes then if no response will need to seek care or notify us and come back in. Is referred todayf or colonoscopy since she had her last colonoscopy in 2005

## 2014-06-03 ENCOUNTER — Telehealth: Payer: Self-pay | Admitting: *Deleted

## 2014-06-03 NOTE — Telephone Encounter (Signed)
[place] Night - Client TELEPHONE ADVICE RECORD [facility] Patient Name: Brianna Harper Gender: Female DOB: Jan 03, 1946 Age: 69 Y 28 M 8 D Return Phone Number: Address: City/State/Zip: [place] [employer] [facility] Night - Client Client Site [place] - Night Contact Type Call Caller Name [place] Phone Number NA Relationship To Patient Self Is this call to report lab results? No Call Type General Information Initial Comment Caller states she has high BP medication, has had constipation issues for 3 weeks. She has been taking Miralax with no relief, she was advised yesterday to go to the Saturday clinic, Provided her with the correct number for the Saturday clinic to make an appointment. General Information Type Appointment Nurse Assessment Guidelines Guideline Title Affirmed Question Affirmed Notes Nurse Date/Time (Eastern Time) Disp. Time Eilene Ghazi Time) Disposition Final User 06/01/2014 9:18:23 AM General Information Provided Yes [facility] After Care Instructions Given Call Event Type User Date / Time Description

## 2014-06-04 ENCOUNTER — Encounter: Payer: Self-pay | Admitting: Internal Medicine

## 2014-06-12 DIAGNOSIS — H01001 Unspecified blepharitis right upper eyelid: Secondary | ICD-10-CM | POA: Diagnosis not present

## 2014-06-12 DIAGNOSIS — H04123 Dry eye syndrome of bilateral lacrimal glands: Secondary | ICD-10-CM | POA: Diagnosis not present

## 2014-06-12 DIAGNOSIS — H01004 Unspecified blepharitis left upper eyelid: Secondary | ICD-10-CM | POA: Diagnosis not present

## 2014-06-13 ENCOUNTER — Other Ambulatory Visit: Payer: Self-pay

## 2014-06-13 DIAGNOSIS — Z1239 Encounter for other screening for malignant neoplasm of breast: Secondary | ICD-10-CM

## 2014-06-17 ENCOUNTER — Encounter: Payer: Self-pay | Admitting: Internal Medicine

## 2014-06-17 ENCOUNTER — Telehealth: Payer: Self-pay

## 2014-06-17 ENCOUNTER — Ambulatory Visit: Payer: Medicare Other | Admitting: Internal Medicine

## 2014-06-17 VITALS — BP 118/76 | HR 85 | Ht 66.0 in | Wt 129.8 lb

## 2014-06-17 DIAGNOSIS — R059 Cough, unspecified: Secondary | ICD-10-CM

## 2014-06-17 DIAGNOSIS — K219 Gastro-esophageal reflux disease without esophagitis: Secondary | ICD-10-CM

## 2014-06-17 NOTE — Telephone Encounter (Signed)
Katie from Dr. Janee Morn office called and states pt is concerned because her e-mail from 2.28.2016 was never answered and pt has some concerns about her BP medication.  On the note from 2.26.2016 it states that pt's medication needed to be changed.  Per the note: Les called back to inform me that the Calan-SR is NOT a 24 hour tablet and that it will need to be changed to Verelan PM capsule. Pt wants 24 hour. Authorized change with the same amount of medication and refills as sent in before for Calan-SR.  Pt is concerned that the mail order pharmacy is not sending her a 24 hour pill due to her blood pressure reading being high.  Today the office pt's bp was 118/76.  Please call Katie to discuss this matter.

## 2014-06-17 NOTE — Progress Notes (Signed)
04/29/14- [name] never smoker referred courtesy of Dr Regis Bill.: cough with clear mucus since 03/17/14. Pt denies wheezing,chest congestion;tightness, sob. Pt has been switched  from Lisinopril to Losartan/HCTZ and has since developed a rash on chest and back.  Husband here She relates onset of annoying cough to change from verapamil to lisinopril several months ago. Subsequently changed to losartan/HCT on January 13 but cough has persisted. No wheeze or shortness of breath. Hard intermittent cough is dramatically worse lying down. Tussive soreness between xiphoid and umbilicus. Aware of globus sensation associated with hiatal hernia and some heartburn. Her omeprazole dose was recently increased to 40 mg before breakfast and 20 mg before dinner. She is having to sleep propped up. She denies postnasal drainage. Denies history of lung disease. She was given prescriptions for benzonatate 100 mg and for prednisone, which she has not filled. In the last few days she has noted a few small red, minimally productive spots between shoulder blades and on sternum. No generalized rash or adenopathy. Chest x-ray was clear  06/17/14- [name] never smoker followed for cough and rash FOLLOWS FOR: Pt states her cough is better; has questions aobut lisinopril and continues to have discomfort in lower throat area(has been seeen by ENT for this). Also,patient has questions about BP meds. Pt has noticed bruised area on her Right arm that is new and would like to discuss this.    ROS-see HPI Constitutional:   No-   weight loss, night sweats, fevers, chills, fatigue, lassitude. HEENT:   No-  headaches, difficulty swallowing, tooth/dental problems, sore throat,       No-  sneezing, itching, ear ache, nasal congestion, post nasal drip,  CV:  No-   chest pain, orthopnea, PND, swelling in lower extremities, anasarca,                                  dizziness, palpitations Resp: No-   shortness of breath with exertion or at rest.               No-   productive cough,  + non-productive cough,  No- coughing up of blood.              No-   change in color of mucus.  No- wheezing.   Skin:+ rash or lesions. GI:  + heartburn, indigestion, abdominal pain, no-nausea, vomiting, diarrhea,                 change in bowel habits, loss of appetite GU: No-   dysuria, change in color of urine, no urgency or frequency.  No- flank pain. MS:  No-   joint pain or swelling.  No- decreased range of motion.  No- back pain. Neuro-     nothing unusual Psych:  No- change in mood or affect. No depression or anxiety.  No memory loss.  OBJ- Physical Exam General- Alert, Oriented, Affect-appropriate, Distress- none acute Skin- rash-none, lesions- none, excoriation- none Lymphadenopathy- none Head- atraumatic            Eyes- Gross vision intact, PERRLA, conjunctivae and secretions clear            Ears- Hearing, canals-normal            Nose- Clear, no-Septal dev, mucus, polyps, erosion, perforation             Throat- Mallampati II , mucosa clear , drainage- none, tonsils- atrophic Neck-  flexible , trachea midline, no stridor , thyroid nl, carotid no bruit Chest - symmetrical excursion , unlabored           Heart/CV- RRR , no murmur , no gallop  , no rub, nl s1 s2                           - JVD- none , edema- none, stasis changes- none, varices- none           Lung- clear to P&A, wheeze- none, cough- none , dullness-none, rub- none           Chest wall-  Abd- tender-no, distended-no, bowel sounds-present, HSM- no Br/ Gen/ Rectal- Not done, not indicated Extrem- cyanosis- none, clubbing, none, atrophy- none, strength- nl Neuro- grossly intact to observation

## 2014-06-17 NOTE — Patient Instructions (Signed)
We will be happy to see you again if we can help  It sounds as if the combination of reflux and the previous lisinopril may have caused most of the cough problem

## 2014-06-17 NOTE — Telephone Encounter (Signed)
Dr. Regis Bill would like to see this patient in the office along with her blood pressure cuff.  Pt notified.  Will come at 1:45 on 06/18/14

## 2014-06-18 ENCOUNTER — Encounter: Payer: Self-pay | Admitting: Internal Medicine

## 2014-06-18 ENCOUNTER — Ambulatory Visit (INDEPENDENT_AMBULATORY_CARE_PROVIDER_SITE_OTHER): Payer: Medicare Other | Admitting: Internal Medicine

## 2014-06-18 ENCOUNTER — Encounter: Payer: Self-pay | Admitting: Family Medicine

## 2014-06-18 VITALS — BP 180/80 | Temp 98.0°F | Ht 66.0 in | Wt 130.3 lb

## 2014-06-18 DIAGNOSIS — E876 Hypokalemia: Secondary | ICD-10-CM

## 2014-06-18 DIAGNOSIS — R002 Palpitations: Secondary | ICD-10-CM | POA: Diagnosis not present

## 2014-06-18 DIAGNOSIS — R739 Hyperglycemia, unspecified: Secondary | ICD-10-CM

## 2014-06-18 DIAGNOSIS — K219 Gastro-esophageal reflux disease without esophagitis: Secondary | ICD-10-CM | POA: Insufficient documentation

## 2014-06-18 DIAGNOSIS — J387 Other diseases of larynx: Secondary | ICD-10-CM | POA: Diagnosis not present

## 2014-06-18 DIAGNOSIS — I10 Essential (primary) hypertension: Secondary | ICD-10-CM | POA: Diagnosis not present

## 2014-06-18 LAB — CBC WITH DIFFERENTIAL/PLATELET
Basophils Absolute: 0 10*3/uL (ref 0.0–0.1)
Basophils Relative: 0.6 % (ref 0.0–3.0)
EOS PCT: 1.5 % (ref 0.0–5.0)
Eosinophils Absolute: 0.1 10*3/uL (ref 0.0–0.7)
HCT: 41.7 % (ref 36.0–46.0)
Hemoglobin: 14.1 g/dL (ref 12.0–15.0)
Lymphocytes Relative: 20.2 % (ref 12.0–46.0)
Lymphs Abs: 1.1 10*3/uL (ref 0.7–4.0)
MCHC: 33.7 g/dL (ref 30.0–36.0)
MCV: 93.4 fl (ref 78.0–100.0)
MONO ABS: 0.4 10*3/uL (ref 0.1–1.0)
Monocytes Relative: 7.5 % (ref 3.0–12.0)
NEUTROS PCT: 70.2 % (ref 43.0–77.0)
Neutro Abs: 3.7 10*3/uL (ref 1.4–7.7)
PLATELETS: 262 10*3/uL (ref 150.0–400.0)
RBC: 4.47 Mil/uL (ref 3.87–5.11)
RDW: 13.4 % (ref 11.5–15.5)
WBC: 5.3 10*3/uL (ref 4.0–10.5)

## 2014-06-18 LAB — MAGNESIUM: Magnesium: 2.2 mg/dL (ref 1.5–2.5)

## 2014-06-18 LAB — HEMOGLOBIN A1C: Hgb A1c MFr Bld: 5.8 % (ref 4.6–6.5)

## 2014-06-18 LAB — BASIC METABOLIC PANEL
BUN: 17 mg/dL (ref 6–23)
CHLORIDE: 104 meq/L (ref 96–112)
CO2: 30 meq/L (ref 19–32)
Calcium: 10 mg/dL (ref 8.4–10.5)
Creatinine, Ser: 0.84 mg/dL (ref 0.40–1.20)
GFR: 71.79 mL/min (ref >60.00)
Glucose, Bld: 95 mg/dL (ref 70–99)
POTASSIUM: 5 meq/L (ref 3.5–5.1)
SODIUM: 141 meq/L (ref 135–145)

## 2014-06-18 LAB — T4, FREE: Free T4: 0.67 ng/dL (ref 0.60–1.60)

## 2014-06-18 LAB — TSH: TSH: 3.25 u[IU]/mL (ref 0.35–4.50)

## 2014-06-18 NOTE — Patient Instructions (Signed)
Blood pressure in not controlled with  current regimen.  Checking  Potassium magnesium  Because of the palpitations at night  Will contact der Johnsie Cancel about possibility of further evaluation.  Consider adding back an arb   If dr Annamaria Boots thinks appropriate  otherwise  will have to use another  medication group.

## 2014-06-18 NOTE — Progress Notes (Signed)
Pre visit review using our clinic review tool, if applicable. No additional management support is needed unless otherwise documented below in the visit note.  Chief Complaint  Patient presents with  . Follow-up    HPI: Brianna Harper 69 y.o. comes in as asked  To evaluated situation   Here with husband Update all since last visit   Battling HT ;Readings at home 143- 150s ocass 160s  Pulse 70 - 80 range issue with med  240 er.   Having wakenings about 3-4 am with  Hearing heart beat in ear and palpitations  Not every night but about 2-3 x per week   Had racing heart after  Endoscopy.    Had  Dehydration and low potassium on diuretic at this time   Went to ed and K was 3.0 no longer on diuretic .   Cough has subsided  Off ace arb and other interveion  [employer] second opinion ; all agreed upper airway is the inflamed part .   Had sever constipation from verapmil now controlled with fiber fluid measures   ROS: See pertinent positives and negatives per HPI.no current cp sob sig anxiety  Bruise on right arm no injury  No bleeding  Past Medical History  Diagnosis Date  . Anxiety   . Hyperlipidemia   . GERD (gastroesophageal reflux disease)   . Osteopenia     dexa -2.0 hip 2008  . Rapid heart rate     hx of   . Laryngitis   . Hypertension   . Migraine     "not very often anymore; they were more menopausal" (04/16/2013)  . Depression     Family History  Problem Relation Age of Onset  . Hypertension Mother   . Migraines Mother   . Arthritis Father   . Ovarian cancer Maternal Grandmother   . Seizures Neg Hx   . Esophageal cancer Neg Hx   . Colon cancer Neg Hx     History   Social History  . Marital Status: Married    Spouse Name: N/A  . Number of Children: 2  . Years of Education: college BS   Occupational History  . retired    Social History Main Topics  . Smoking status: Never Smoker   . Smokeless tobacco: Never Used  . Alcohol Use: No  . Drug  Use: No  . Sexual Activity: Yes   Other Topics Concern  . None   Social History Narrative   HHof 2    Non smoker   2 children  Also twins that did not survive birth   Married    CB x 2   No pets     Outpatient Encounter Prescriptions as of 06/18/2014  Medication Sig  . cholecalciferol (VITAMIN D) 1000 UNITS tablet Take 2,000 Units by mouth daily.  . Multiple Vitamin (MULTIVITAMIN WITH MINERALS) TABS tablet Take 1 tablet by mouth daily.  Marland Kitchen omeprazole (PRILOSEC) 20 MG capsule Take 20 mg by mouth daily before supper.  Marland Kitchen omeprazole (PRILOSEC) 40 MG capsule Take 40 mg by mouth daily before breakfast.   . verapamil (VERELAN) 240 MG 24 hr capsule Take 1 capsule (240 mg total) by mouth at bedtime.    EXAM:  BP 180/80 mmHg  Temp(Src) 98 F (36.7 C) (Oral)  Ht 5\' 6"  (1.676 m)  Wt 130 lb 4.8 oz (59.104 kg)  BMI 21.04 kg/m2  Body mass index is 21.04 kg/(m^2).  GENERAL: vitals reviewed and listed above, alert,  oriented, appears well hydrated and in no acute distress HEENT: atraumatic, conjunctiva  clear, no obvious abnormalities on inspection of external nose and ears OP : no lesion edema or exudate  NECK: no obvious masses on inspection palpation  LUNGS: clear to auscultation bilaterally, no wheezes, rales or rhonchi, good air movement CV: HRRR, no clubbing cyanosis or  peripheral edema nl cap refill  MS: moves all extremities without noticeable focal  Abnormality Arm right 2 cm path lookls like senile ecchymosis but isolated and skin not that thinning.  PSYCH: pleasant and cooperative, no obvious depression or anxiety frustrated   BP Readings from Last 3 Encounters:  06/18/14 180/80  06/17/14 118/76  06/01/14 132/92   Wt Readings from Last 3 Encounters:  06/18/14 130 lb 4.8 oz (59.104 kg)  06/17/14 129 lb 12.8 oz (58.877 kg)  06/01/14 129 lb 1.3 oz (58.55 kg)    180/88 right arm sitting    Pt monitor   190/ AT home 143- 158  ocass 160 Pulse   70 80s    ASSESSMENT AND  PLAN:  Discussed the following assessment and plan:  Essential hypertension - was best with the acrb but off  tp get rid of cough   ? if can reintroduce arb  vs other med  - Plan: Basic metabolic panel, Magnesium, CBC with Differential/Platelet, Hemoglobin A1c, TSH, T4, free  Laryngopharyngeal reflux (LPR) - under monitor and rx  - Plan: Basic metabolic panel, Magnesium, CBC with Differential/Platelet, Hemoglobin A1c, TSH, T4, free  Hypokalemia - ercheck from diuretic  - Plan: Basic metabolic panel, Magnesium, CBC with Differential/Platelet, Hemoglobin A1c, TSH, T4, free  Palpitations - night awakenings metabolic nl ask cards about   monitor to define what is happening - Plan: Basic metabolic panel, Magnesium, CBC with Differential/Platelet, Hemoglobin A1c, TSH, T4, free  Hyperglycemia - check a1c  - Plan: Basic metabolic panel, Magnesium, CBC with Differential/Platelet, Hemoglobin A1c, TSH, T4, free Low potassium    Was on diuretic at the time? elevated bg  In ed check a1c  Cough finally gone  Had follow up  Dr . Merleen Nicely ER 240 capsule.     Gets about 2 x per week .  Wakens up. 3-4 am .  Cozaar seem to help BP But  Got Resp sx from this so off  And now BP up high  Diuretic chlorthalidone  caused low K ..  Helped bp     Back on verapamil  Not controlled   Slight change could aggravate reflux Pulse i s not even low  Had se of constipation  Would plan on  Getting back ion arb asking dr Annamaria Boots opinion otherwise different drg group.  wakening with palpitations    Advise dr Johnsie Cancel if hr monitor would be helpful   -Patient advised to return or notify health care team  if symptoms worsen ,persist or new concerns arise.  Patient Instructions  Blood pressure in not controlled with  current regimen.  Checking  Potassium magnesium  Because of the palpitations at night  Will contact der Johnsie Cancel about possibility of further evaluation.  Consider adding back an arb   If dr Annamaria Boots thinks appropriate   otherwise  will have to use another  medication group.    Standley Brooking. Panosh M.D.  Total visit 67mins > 50% spent counseling and coordinating care

## 2014-06-19 ENCOUNTER — Other Ambulatory Visit: Payer: Self-pay | Admitting: Internal Medicine

## 2014-06-19 ENCOUNTER — Telehealth: Payer: Self-pay | Admitting: Internal Medicine

## 2014-06-19 NOTE — Telephone Encounter (Signed)
emmi emailed °

## 2014-06-20 ENCOUNTER — Other Ambulatory Visit: Payer: Self-pay | Admitting: Family Medicine

## 2014-06-20 MED ORDER — TELMISARTAN 80 MG PO TABS: 80.0000 mg | ORAL_TABLET | Freq: Every day | ORAL | Status: DC

## 2014-06-24 ENCOUNTER — Encounter: Payer: Self-pay | Admitting: Internal Medicine

## 2014-06-24 ENCOUNTER — Ambulatory Visit (INDEPENDENT_AMBULATORY_CARE_PROVIDER_SITE_OTHER): Payer: Medicare Other | Admitting: Internal Medicine

## 2014-06-24 VITALS — BP 190/98 | Temp 98.1°F | Ht 66.0 in | Wt 127.6 lb

## 2014-06-24 DIAGNOSIS — R42 Dizziness and giddiness: Secondary | ICD-10-CM | POA: Diagnosis not present

## 2014-06-24 DIAGNOSIS — T887XXA Unspecified adverse effect of drug or medicament, initial encounter: Secondary | ICD-10-CM | POA: Diagnosis not present

## 2014-06-24 DIAGNOSIS — T50905A Adverse effect of unspecified drugs, medicaments and biological substances, initial encounter: Secondary | ICD-10-CM

## 2014-06-24 DIAGNOSIS — I10 Essential (primary) hypertension: Secondary | ICD-10-CM

## 2014-06-24 MED ORDER — METOPROLOL TARTRATE 25 MG PO TABS: 25.0000 mg | ORAL_TABLET | Freq: Two times a day (BID) | ORAL | Status: AC

## 2014-06-24 NOTE — Patient Instructions (Signed)
Stay off the  micardis for now .    soimetimes lower dose 40 mg can help.   Begin lopressor  2 x per day  Call in 1 week   About how you are doing.  FU in  2 weeks  OV.

## 2014-06-24 NOTE — Progress Notes (Signed)
Pre visit review using our clinic review tool, if applicable. No additional management support is needed unless otherwise documented below in the visit note.   Chief Complaint  Patient presents with  . Medication Reaction    Dizziness/Lightheaded    HPI: Brianna Harper   Comes in for acute visit    See phone notes last visit  Began  First dose of micardiis  Am  10  And took [employer]  Then second day in afternoon.  Soon after taking  Trying to to grocery store and felt  Light headed and dizzy and laid d down . Nausea  Felt  Weak feeling .  All over .  And  Didn't take the next day.   Then dizzy standing up .  Took no bp medicine  Including the verapamil Yesterday.   Was 168 / 9  174  Sunday  Off meds after that.  Log of readings prev  140 - 150s some 160 pulse usually 80 - 90s  Now has headache. Swelling throat feeling   Last day not initially ( has a reflux tendency but no edema sob or cough)  No rash  Didn't sleep well last pm  Has ha today .  bp up today .higher .  ROS: See pertinent positives and negatives per HPI.  Past Medical History  Diagnosis Date  . Anxiety   . Hyperlipidemia   . GERD (gastroesophageal reflux disease)   . Osteopenia     dexa -2.0 hip 2008  . Rapid heart rate     hx of   . Laryngitis   . Hypertension   . Migraine     "not very often anymore; they were more menopausal" (04/16/2013)  . Depression     Family History  Problem Relation Age of Onset  . Hypertension Mother   . Migraines Mother   . Arthritis Father   . Ovarian cancer Maternal Grandmother   . Seizures Neg Hx   . Esophageal cancer Neg Hx   . Colon cancer Neg Hx     History   Social History  . Marital Status: Married    Spouse Name: N/A  . Number of Children: 2  . Years of Education: college BS   Occupational History  . retired    Social History Main Topics  . Smoking status: Never Smoker   . Smokeless tobacco: Never Used  . Alcohol Use: No  . Drug Use: No  . Sexual  Activity: Yes   Other Topics Concern  . None   Social History Narrative   HHof 2    Non smoker   2 children  Also twins that did not survive birth   Married    CB x 2   No pets     Outpatient Encounter Prescriptions as of 06/24/2014  Medication Sig  . cholecalciferol (VITAMIN D) 1000 UNITS tablet Take 2,000 Units by mouth daily.  . Multiple Vitamin (MULTIVITAMIN WITH MINERALS) TABS tablet Take 1 tablet by mouth daily.  Marland Kitchen omeprazole (PRILOSEC) 20 MG capsule Take 20 mg by mouth daily before supper.  Marland Kitchen omeprazole (PRILOSEC) 40 MG capsule Take 40 mg by mouth daily before breakfast.   . telmisartan (MICARDIS) 80 MG tablet Take 1 tablet (80 mg total) by mouth daily.  . metoprolol tartrate (LOPRESSOR) 25 MG tablet Take 1 tablet (25 mg total) by mouth 2 (two) times daily.  . [DISCONTINUED] verapamil (VERELAN) 240 MG 24 hr capsule Take 1 capsule (240 mg total)  by mouth at bedtime.    EXAM:  BP 190/98 mmHg  Temp(Src) 98.1 F (36.7 C) (Oral)  Ht 5\' 6"  (1.676 m)  Wt 127 lb 9.6 oz (57.879 kg)  BMI 20.61 kg/m2  Body mass index is 20.61 kg/(m^2).  GENERAL: vitals reviewed and listed above, alert, oriented, appears well hydrated and in no acute distress looks tired non toxic  HEENT: atraumatic, conjunctiva  clear, no obvious abnormalities on inspection of external nose and ears OP : no lesion edema or exudate  NECK: no obvious masses on inspection palpation  LUNGS: clear to auscultation bilaterally, no wheezes, rales or rhonchi, good air movement CV: HRRR, no clubbing cyanosis or  peripheral edema nl cap refill  MS: moves all extremities without noticeable focal  abnormality PSYCH oriented tired   Log reviewed  Lab Results  Component Value Date   WBC 5.3 06/18/2014   HGB 14.1 06/18/2014   HCT 41.7 06/18/2014   PLT 262.0 06/18/2014   GLUCOSE 95 06/18/2014   CHOL 202* 04/16/2013   TRIG 79 04/16/2013   HDL 69 04/16/2013   LDLDIRECT 109.8 05/01/2012   LDLCALC 117* 04/16/2013   ALT  11 05/22/2014   AST 18 05/22/2014   NA 141 06/18/2014   K 5.0 06/18/2014   CL 104 06/18/2014   CREATININE 0.84 06/18/2014   BUN 17 06/18/2014   CO2 30 06/18/2014   TSH 3.25 06/18/2014   HGBA1C 5.8 06/18/2014    ASSESSMENT AND PLAN:  Discussed the following assessment and plan:  Medication side effect, initial encounter - felt dizzy and weak with micardis  one dose   - Plan: Ambulatory referral to Cardiology  Dizziness and giddiness  Uncontrolled hypertension - stopped all bp medication for a day over weekend hx of se of various meds    onsider secondary ht but hasnt been on triple therapy failure not tolerating meds . - Plan: Ambulatory referral to Cardiology Se of meds various issues    Se should be gone days after stopping if so  First dose tolerated Pt ask and ai agreed that ht specialist    Ok for consults ut in interim add b blocker ( wason one years ago) consider adding clonidine if not coming dow to 160 range   Contact i a week and rov in 2 weeks  Referral  -Patient advised to return or notify health care team  if symptoms worsen ,persist or new concerns arise.  Patient Instructions  Stay off the  micardis for now .    soimetimes lower dose 40 mg can help.   Begin lopressor  2 x per day  Call in 1 week   About how you are doing.  FU in  2 weeks  OV.    Standley Brooking. Panosh M.D.

## 2014-06-25 DIAGNOSIS — F411 Generalized anxiety disorder: Secondary | ICD-10-CM | POA: Diagnosis not present

## 2014-06-25 DIAGNOSIS — I10 Essential (primary) hypertension: Secondary | ICD-10-CM | POA: Diagnosis not present

## 2014-06-27 ENCOUNTER — Telehealth: Payer: Self-pay | Admitting: Family Medicine

## 2014-06-27 NOTE — Telephone Encounter (Signed)
Spoke to the patient's husband.  Her blood pressures today have been 158/79 and 162/78.  Yesterday her bp was 179/77.

## 2014-06-27 NOTE — Telephone Encounter (Signed)
Continue  Fu in 2 weeks if not in the ht clinic by then . Hopefully tolerating  The med

## 2014-06-28 ENCOUNTER — Encounter: Payer: Self-pay | Admitting: Internal Medicine

## 2014-06-28 NOTE — Telephone Encounter (Signed)
Pt sent me a message on MyChart.  I sent the below information back in a message.  See MyChart message.

## 2014-06-30 ENCOUNTER — Emergency Department (HOSPITAL_COMMUNITY)
Admission: EM | Admit: 2014-06-30 | Discharge: 2014-06-30 | Disposition: A | Discharge status: Home / Self Care | Payer: Medicare Other | Attending: Emergency Medicine | Admitting: Emergency Medicine

## 2014-06-30 ENCOUNTER — Encounter (HOSPITAL_COMMUNITY): Payer: Self-pay | Admitting: Emergency Medicine

## 2014-06-30 ENCOUNTER — Emergency Department (HOSPITAL_COMMUNITY): Payer: Medicare Other

## 2014-06-30 DIAGNOSIS — I6789 Other cerebrovascular disease: Secondary | ICD-10-CM | POA: Diagnosis not present

## 2014-06-30 DIAGNOSIS — K219 Gastro-esophageal reflux disease without esophagitis: Secondary | ICD-10-CM | POA: Diagnosis not present

## 2014-06-30 DIAGNOSIS — G43909 Migraine, unspecified, not intractable, without status migrainosus: Secondary | ICD-10-CM | POA: Diagnosis not present

## 2014-06-30 DIAGNOSIS — M8580 Other specified disorders of bone density and structure, unspecified site: Secondary | ICD-10-CM | POA: Diagnosis not present

## 2014-06-30 DIAGNOSIS — Z8659 Personal history of other mental and behavioral disorders: Secondary | ICD-10-CM | POA: Insufficient documentation

## 2014-06-30 DIAGNOSIS — Z79899 Other long term (current) drug therapy: Secondary | ICD-10-CM | POA: Diagnosis not present

## 2014-06-30 DIAGNOSIS — E785 Hyperlipidemia, unspecified: Secondary | ICD-10-CM | POA: Insufficient documentation

## 2014-06-30 DIAGNOSIS — Z8669 Personal history of other diseases of the nervous system and sense organs: Secondary | ICD-10-CM | POA: Diagnosis not present

## 2014-06-30 DIAGNOSIS — R200 Anesthesia of skin: Secondary | ICD-10-CM | POA: Insufficient documentation

## 2014-06-30 DIAGNOSIS — R42 Dizziness and giddiness: Secondary | ICD-10-CM | POA: Diagnosis not present

## 2014-06-30 DIAGNOSIS — Z8709 Personal history of other diseases of the respiratory system: Secondary | ICD-10-CM | POA: Diagnosis not present

## 2014-06-30 DIAGNOSIS — R4182 Altered mental status, unspecified: Secondary | ICD-10-CM

## 2014-06-30 DIAGNOSIS — I10 Essential (primary) hypertension: Secondary | ICD-10-CM | POA: Diagnosis present

## 2014-06-30 LAB — BASIC METABOLIC PANEL
Anion gap: 8 (ref 5–15)
BUN: 10 mg/dL (ref 6–23)
CO2: 28 mmol/L (ref 19–32)
CREATININE: 0.84 mg/dL (ref 0.50–1.10)
Calcium: 9.3 mg/dL (ref 8.4–10.5)
Chloride: 105 mmol/L (ref 96–112)
GFR, EST AFRICAN AMERICAN: 82 mL/min — AB (ref >90)
GFR, EST NON AFRICAN AMERICAN: 70 mL/min — AB (ref >90)
Glucose, Bld: 106 mg/dL — ABNORMAL HIGH (ref 70–99)
Potassium: 4.1 mmol/L (ref 3.5–5.1)
SODIUM: 141 mmol/L (ref 135–145)

## 2014-06-30 LAB — CBC WITH DIFFERENTIAL/PLATELET
Basophils Absolute: 0 10*3/uL (ref 0.0–0.1)
Basophils Relative: 1 % (ref 0–1)
EOS ABS: 0.1 10*3/uL (ref 0.0–0.7)
EOS PCT: 1 % (ref 0–5)
HCT: 40.4 % (ref 36.0–46.0)
Hemoglobin: 13.7 g/dL (ref 12.0–15.0)
Lymphocytes Relative: 19 % (ref 12–46)
Lymphs Abs: 0.9 10*3/uL (ref 0.7–4.0)
MCH: 31.2 pg (ref 26.0–34.0)
MCHC: 33.9 g/dL (ref 30.0–36.0)
MCV: 92 fL (ref 78.0–100.0)
MONOS PCT: 7 % (ref 3–12)
Monocytes Absolute: 0.3 10*3/uL (ref 0.1–1.0)
NEUTROS ABS: 3.3 10*3/uL (ref 1.7–7.7)
Neutrophils Relative %: 72 % (ref 43–77)
PLATELETS: 204 10*3/uL (ref 150–400)
RBC: 4.39 MIL/uL (ref 3.87–5.11)
RDW: 12.5 % (ref 11.5–15.5)
WBC: 4.6 10*3/uL (ref 4.0–10.5)

## 2014-06-30 MED ORDER — SODIUM CHLORIDE 0.9 % IV SOLN
INTRAVENOUS | Status: DC
  Administered 2014-06-30: 11:00:00 via INTRAVENOUS

## 2014-06-30 MED ORDER — ASPIRIN 81 MG PO CHEW: 81.0000 mg | CHEWABLE_TABLET | Freq: Every day | ORAL | Status: DC

## 2014-06-30 MED ORDER — LORAZEPAM 1 MG PO TABS
1.0000 mg | ORAL_TABLET | Freq: Once | ORAL | Status: AC
  Administered 2014-06-30: 1 mg via ORAL
  Filled 2014-06-30: qty 1

## 2014-06-30 NOTE — Discharge Instructions (Signed)
Take an aspirin a day and follow-up with her Dr. tomorrow for your high blood pressure

## 2014-06-30 NOTE — ED Notes (Signed)
Patient returned from MRI.

## 2014-06-30 NOTE — ED Notes (Signed)
Patient transported to MRI 

## 2014-06-30 NOTE — ED Notes (Signed)
At 0900 had numbness and tingling in left face and left leg. About a 10 minute period of confusion per husband. Denies any pain. All symptoms currently resolved. No numbness, no weakness, no balance issues, no dizziness, no confusion. HTN on EMS arrival 192/94, reports history of HTN, has not had medication yet today.

## 2014-06-30 NOTE — ED Provider Notes (Signed)
CSN: [id number]     Arrival date & time 06/30/14  1000 History   First MD Initiated Contact with Patient 06/30/14 1007     Chief Complaint  Patient presents with  . Hypertension  . Numbness     (Consider location/radiation/quality/duration/timing/severity/associated sxs/prior Treatment) HPI Comments: Patient here with increased blood pressure times several days with associated transient confusion characterized as not being able to read member her blood pressure medication. Last for approximately 10 minutes. She also noted paresthesias to her her left face as well as her left arm. They have all resolved. Denies any chest pain or shortness of breath. No severe headache or dizziness or weakness. No ataxia noted. 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 She has been evaluated with carotid Doppler as well as echocardiogram. Did not take her blood pressure medication this morning. Did see her physician this week who started her on a new antihypertensive.  Patient is a 69 y.o. female presenting with hypertension. The history is provided by the patient.  Hypertension    Past Medical History  Diagnosis Date  . Anxiety   . Hyperlipidemia   . GERD (gastroesophageal reflux disease)   . Osteopenia     dexa -2.0 hip 2008  . Rapid heart rate     hx of   . Laryngitis   . Hypertension   . Migraine     "not very often anymore; they were more menopausal" (04/16/2013)  . Depression    Past Surgical History  Procedure Laterality Date  . Colonoscopy  02/03/2004    internal hemorrhoids  . Upper gastrointestinal endoscopy  08/29/2008    gastritis, hiatal hernia   Family History  Problem Relation Age of Onset  . Hypertension Mother   . Migraines Mother   . Arthritis Father   . Ovarian cancer Maternal Grandmother   . Seizures Neg Hx   . Esophageal cancer Neg Hx   . Colon cancer Neg Hx    History  Substance Use Topics  . Smoking status: Never Smoker   . Smokeless tobacco: Never Used  .  Alcohol Use: No   OB History    No data available     Review of Systems  All other systems reviewed and are negative.     Allergies  Lisinopril; Erythromycin ethylsuccinate; Nitrofurantoin; and Sulfadiazine  Home Medications   Prior to Admission medications   Medication Sig Start Date End Date Taking? Authorizing Provider  cholecalciferol (VITAMIN D) 1000 UNITS tablet Take 2,000 Units by mouth daily.    Historical Provider, MD  metoprolol tartrate (LOPRESSOR) 25 MG tablet Take 1 tablet (25 mg total) by mouth 2 (two) times daily. 06/24/14   Burnis Medin, MD  Multiple Vitamin (MULTIVITAMIN WITH MINERALS) TABS tablet Take 1 tablet by mouth daily.    Historical Provider, MD  omeprazole (PRILOSEC) 20 MG capsule Take 20 mg by mouth daily before supper.    Historical Provider, MD  omeprazole (PRILOSEC) 40 MG capsule Take 40 mg by mouth daily before breakfast.     Historical Provider, MD   BP 178/84 mmHg  Pulse 73  Temp(Src) 97.9 F (36.6 C) (Oral)  Resp 12  Ht 5\' 6"  (1.676 m)  Wt 128 lb (58.06 kg)  BMI 20.67 kg/m2  SpO2 99% Physical Exam  Constitutional: She is oriented to person, place, and time. She appears well-developed and well-nourished.  Non-toxic appearance. No distress.  HENT:  Head: Normocephalic and atraumatic.  Eyes: Conjunctivae, EOM and lids are normal. Pupils  are equal, round, and reactive to light.  Neck: Normal range of motion. Neck supple. No tracheal deviation present. No thyroid mass present.  Cardiovascular: Normal rate, regular rhythm and normal heart sounds.  Exam reveals no gallop.   No murmur heard. Pulmonary/Chest: Effort normal and breath sounds normal. No stridor. No respiratory distress. She has no decreased breath sounds. She has no wheezes. She has no rhonchi. She has no rales.  Abdominal: Soft. Normal appearance and bowel sounds are normal. She exhibits no distension. There is no tenderness. There is no rebound and no CVA tenderness.   Musculoskeletal: Normal range of motion. She exhibits no edema or tenderness.  Neurological: She is alert and oriented to person, place, and time. She has normal strength. No cranial nerve deficit or sensory deficit. GCS eye subscore is 4. GCS verbal subscore is 5. GCS motor subscore is 6.  Skin: Skin is warm and dry. No abrasion and no rash noted.  Psychiatric: She has a normal mood and affect. Her speech is normal and behavior is normal.  Nursing note and vitals reviewed.   ED Course  Procedures (including critical care time) Labs Review Labs Reviewed  BASIC METABOLIC PANEL - Abnormal; Notable for the following:    Glucose, Bld 106 (*)    GFR calc non Af Amer 70 (*)    GFR calc Af Amer 82 (*)    All other components within normal limits  CBC WITH DIFFERENTIAL/PLATELET    Imaging Review No results found.   EKG Interpretation   Date/Time:  Sunday June 30 2014 10:09:00 EDT Ventricular Rate:  67 PR Interval:    QRS Duration: 92 QT Interval:  408 QTC Calculation: 431 R Axis:   97 Text Interpretation:  Normal sinus rhythm Right axis deviation Confirmed  by Juanda Luba  MD, Felton Buczynski ([id number]) on 06/30/2014 10:26:39 AM      MDM   Final diagnoses:  Altered mental state      Patient had a negative MRI according to radiology of her brain. Patient recently has had a Doppler of her carotid as well as cardiac echo within the past year both of which were negative for acute process. I discussed her case with the neurologist on call, Dr. Doy Mince, and she recommends the patient start an aspirin and follow-up with her Dr. Repeat neurological exam at time of discharge remains unchanged and stable  Lacretia Leigh, MD 06/30/14 1314

## 2014-07-02 ENCOUNTER — Encounter: Payer: Self-pay | Admitting: Internal Medicine

## 2014-07-02 DIAGNOSIS — R030 Elevated blood-pressure reading, without diagnosis of hypertension: Secondary | ICD-10-CM | POA: Diagnosis not present

## 2014-07-08 DIAGNOSIS — Z8669 Personal history of other diseases of the nervous system and sense organs: Secondary | ICD-10-CM | POA: Diagnosis not present

## 2014-07-08 DIAGNOSIS — I10 Essential (primary) hypertension: Secondary | ICD-10-CM | POA: Diagnosis not present

## 2014-07-08 DIAGNOSIS — F411 Generalized anxiety disorder: Secondary | ICD-10-CM | POA: Diagnosis not present

## 2014-07-17 ENCOUNTER — Ambulatory Visit: Payer: Medicare Other | Admitting: Internal Medicine

## 2014-07-19 ENCOUNTER — Ambulatory Visit: Payer: Medicare Other | Admitting: Internal Medicine

## 2014-07-31 DIAGNOSIS — J384 Edema of larynx: Secondary | ICD-10-CM | POA: Diagnosis not present

## 2014-07-31 DIAGNOSIS — Z79899 Other long term (current) drug therapy: Secondary | ICD-10-CM | POA: Diagnosis not present

## 2014-07-31 DIAGNOSIS — F458 Other somatoform disorders: Secondary | ICD-10-CM | POA: Diagnosis not present

## 2014-07-31 DIAGNOSIS — J387 Other diseases of larynx: Secondary | ICD-10-CM | POA: Diagnosis not present

## 2014-07-31 DIAGNOSIS — F419 Anxiety disorder, unspecified: Secondary | ICD-10-CM | POA: Diagnosis not present

## 2014-07-31 DIAGNOSIS — K219 Gastro-esophageal reflux disease without esophagitis: Secondary | ICD-10-CM | POA: Diagnosis not present

## 2014-08-05 ENCOUNTER — Ambulatory Visit
Admission: RE | Admit: 2014-08-05 | Discharge: 2014-08-05 | Disposition: A | Discharge status: Home / Self Care | Payer: Medicare Other | Source: Ambulatory Visit

## 2014-08-05 DIAGNOSIS — Z1231 Encounter for screening mammogram for malignant neoplasm of breast: Secondary | ICD-10-CM | POA: Diagnosis not present

## 2014-08-05 DIAGNOSIS — Z1239 Encounter for other screening for malignant neoplasm of breast: Secondary | ICD-10-CM

## 2014-08-22 ENCOUNTER — Encounter: Payer: Medicare Other | Admitting: Internal Medicine

## 2014-09-17 DIAGNOSIS — R002 Palpitations: Secondary | ICD-10-CM | POA: Diagnosis not present

## 2014-09-17 DIAGNOSIS — I10 Essential (primary) hypertension: Secondary | ICD-10-CM | POA: Diagnosis not present

## 2014-09-17 DIAGNOSIS — E876 Hypokalemia: Secondary | ICD-10-CM | POA: Diagnosis not present

## 2014-09-17 DIAGNOSIS — K219 Gastro-esophageal reflux disease without esophagitis: Secondary | ICD-10-CM | POA: Diagnosis not present

## 2014-09-17 DIAGNOSIS — Z1389 Encounter for screening for other disorder: Secondary | ICD-10-CM | POA: Diagnosis not present

## 2014-09-17 DIAGNOSIS — Z6821 Body mass index (BMI) 21.0-21.9, adult: Secondary | ICD-10-CM | POA: Diagnosis not present

## 2014-10-16 DIAGNOSIS — I10 Essential (primary) hypertension: Secondary | ICD-10-CM | POA: Diagnosis not present

## 2014-10-16 DIAGNOSIS — R51 Headache: Secondary | ICD-10-CM | POA: Diagnosis not present

## 2014-11-29 DIAGNOSIS — Z6822 Body mass index (BMI) 22.0-22.9, adult: Secondary | ICD-10-CM | POA: Diagnosis not present

## 2014-11-29 DIAGNOSIS — Z23 Encounter for immunization: Secondary | ICD-10-CM | POA: Diagnosis not present

## 2014-11-29 DIAGNOSIS — J309 Allergic rhinitis, unspecified: Secondary | ICD-10-CM | POA: Diagnosis not present

## 2014-11-29 DIAGNOSIS — K219 Gastro-esophageal reflux disease without esophagitis: Secondary | ICD-10-CM | POA: Diagnosis not present

## 2014-11-29 DIAGNOSIS — I10 Essential (primary) hypertension: Secondary | ICD-10-CM | POA: Diagnosis not present

## 2014-12-16 ENCOUNTER — Other Ambulatory Visit: Payer: Self-pay | Admitting: Internal Medicine

## 2014-12-16 NOTE — Telephone Encounter (Signed)
Looks like this came from [facility].  Please advise.  Thanks!

## 2014-12-16 NOTE — Telephone Encounter (Signed)
i know she is being seen at  [facility]  For her bp ,  However  she has changed pcps to [facility]    Please send requests for meds to them

## 2015-02-07 DIAGNOSIS — L819 Disorder of pigmentation, unspecified: Secondary | ICD-10-CM | POA: Diagnosis not present

## 2015-02-07 DIAGNOSIS — L720 Epidermal cyst: Secondary | ICD-10-CM | POA: Diagnosis not present

## 2015-02-07 DIAGNOSIS — D1801 Hemangioma of skin and subcutaneous tissue: Secondary | ICD-10-CM | POA: Diagnosis not present

## 2015-02-07 DIAGNOSIS — D225 Melanocytic nevi of trunk: Secondary | ICD-10-CM | POA: Diagnosis not present

## 2015-02-07 DIAGNOSIS — L570 Actinic keratosis: Secondary | ICD-10-CM | POA: Diagnosis not present

## 2015-02-07 DIAGNOSIS — L400 Psoriasis vulgaris: Secondary | ICD-10-CM | POA: Diagnosis not present

## 2015-03-12 ENCOUNTER — Other Ambulatory Visit: Payer: Self-pay | Admitting: Internal Medicine

## 2015-03-12 DIAGNOSIS — Z6822 Body mass index (BMI) 22.0-22.9, adult: Secondary | ICD-10-CM | POA: Diagnosis not present

## 2015-03-12 DIAGNOSIS — J309 Allergic rhinitis, unspecified: Secondary | ICD-10-CM | POA: Diagnosis not present

## 2015-03-12 DIAGNOSIS — I10 Essential (primary) hypertension: Secondary | ICD-10-CM | POA: Diagnosis not present

## 2015-03-21 ENCOUNTER — Other Ambulatory Visit: Payer: Medicare Other

## 2015-04-09 ENCOUNTER — Ambulatory Visit
Admission: RE | Admit: 2015-04-09 | Discharge: 2015-04-09 | Disposition: A | Discharge status: Home / Self Care | Payer: Medicare Other | Source: Ambulatory Visit | Attending: Internal Medicine | Admitting: Internal Medicine

## 2015-04-09 DIAGNOSIS — J309 Allergic rhinitis, unspecified: Secondary | ICD-10-CM

## 2015-04-09 DIAGNOSIS — J320 Chronic maxillary sinusitis: Secondary | ICD-10-CM | POA: Diagnosis not present

## 2015-04-11 DIAGNOSIS — N390 Urinary tract infection, site not specified: Secondary | ICD-10-CM | POA: Diagnosis not present

## 2015-04-11 DIAGNOSIS — I10 Essential (primary) hypertension: Secondary | ICD-10-CM | POA: Diagnosis not present

## 2015-04-11 DIAGNOSIS — E876 Hypokalemia: Secondary | ICD-10-CM | POA: Diagnosis not present

## 2015-04-11 DIAGNOSIS — R8299 Other abnormal findings in urine: Secondary | ICD-10-CM | POA: Diagnosis not present

## 2015-04-18 DIAGNOSIS — Z1389 Encounter for screening for other disorder: Secondary | ICD-10-CM | POA: Diagnosis not present

## 2015-04-18 DIAGNOSIS — J309 Allergic rhinitis, unspecified: Secondary | ICD-10-CM | POA: Diagnosis not present

## 2015-04-18 DIAGNOSIS — K219 Gastro-esophageal reflux disease without esophagitis: Secondary | ICD-10-CM | POA: Diagnosis not present

## 2015-04-18 DIAGNOSIS — I10 Essential (primary) hypertension: Secondary | ICD-10-CM | POA: Diagnosis not present

## 2015-04-18 DIAGNOSIS — N390 Urinary tract infection, site not specified: Secondary | ICD-10-CM | POA: Diagnosis not present

## 2015-04-18 DIAGNOSIS — Z0000 Encounter for general adult medical examination without abnormal findings: Secondary | ICD-10-CM | POA: Diagnosis not present

## 2015-04-18 DIAGNOSIS — Z23 Encounter for immunization: Secondary | ICD-10-CM | POA: Diagnosis not present

## 2015-04-18 DIAGNOSIS — R002 Palpitations: Secondary | ICD-10-CM | POA: Diagnosis not present

## 2015-04-18 DIAGNOSIS — R946 Abnormal results of thyroid function studies: Secondary | ICD-10-CM | POA: Diagnosis not present

## 2015-04-18 DIAGNOSIS — Z6822 Body mass index (BMI) 22.0-22.9, adult: Secondary | ICD-10-CM | POA: Diagnosis not present

## 2015-05-05 ENCOUNTER — Encounter: Payer: Self-pay | Admitting: Internal Medicine

## 2015-05-09 DIAGNOSIS — R21 Rash and other nonspecific skin eruption: Secondary | ICD-10-CM | POA: Diagnosis not present

## 2015-05-09 DIAGNOSIS — D485 Neoplasm of uncertain behavior of skin: Secondary | ICD-10-CM | POA: Diagnosis not present

## 2015-05-09 DIAGNOSIS — D0472 Carcinoma in situ of skin of left lower limb, including hip: Secondary | ICD-10-CM | POA: Diagnosis not present

## 2015-05-09 DIAGNOSIS — D0471 Carcinoma in situ of skin of right lower limb, including hip: Secondary | ICD-10-CM | POA: Diagnosis not present

## 2015-05-09 DIAGNOSIS — L400 Psoriasis vulgaris: Secondary | ICD-10-CM | POA: Diagnosis not present

## 2015-05-19 DIAGNOSIS — J384 Edema of larynx: Secondary | ICD-10-CM | POA: Diagnosis not present

## 2015-05-19 DIAGNOSIS — J029 Acute pharyngitis, unspecified: Secondary | ICD-10-CM | POA: Diagnosis not present

## 2015-05-19 DIAGNOSIS — F458 Other somatoform disorders: Secondary | ICD-10-CM | POA: Diagnosis not present

## 2015-05-19 DIAGNOSIS — F419 Anxiety disorder, unspecified: Secondary | ICD-10-CM | POA: Diagnosis not present

## 2015-05-19 DIAGNOSIS — J387 Other diseases of larynx: Secondary | ICD-10-CM | POA: Diagnosis not present

## 2015-06-04 DIAGNOSIS — D0472 Carcinoma in situ of skin of left lower limb, including hip: Secondary | ICD-10-CM | POA: Diagnosis not present

## 2015-06-11 DIAGNOSIS — H6691 Otitis media, unspecified, right ear: Secondary | ICD-10-CM | POA: Diagnosis not present

## 2015-06-11 DIAGNOSIS — R0982 Postnasal drip: Secondary | ICD-10-CM | POA: Diagnosis not present

## 2015-06-11 DIAGNOSIS — J309 Allergic rhinitis, unspecified: Secondary | ICD-10-CM | POA: Diagnosis not present

## 2015-06-11 DIAGNOSIS — Z6823 Body mass index (BMI) 23.0-23.9, adult: Secondary | ICD-10-CM | POA: Diagnosis not present

## 2015-07-01 ENCOUNTER — Telehealth: Payer: Self-pay | Admitting: *Deleted

## 2015-07-01 ENCOUNTER — Other Ambulatory Visit: Payer: Self-pay

## 2015-07-01 DIAGNOSIS — Z1231 Encounter for screening mammogram for malignant neoplasm of breast: Secondary | ICD-10-CM

## 2015-07-01 NOTE — Telephone Encounter (Signed)
I will have the patient a message that we had made and air and were looking at 2016 notes instead of 2017 notes by mistake, the 2016 notes for the most recent ones and there and there were for the same month and several unless missed that the ER visit she had was a year ago. I apologized and told her to call back if any other specific questions.

## 2015-07-01 NOTE — Telephone Encounter (Signed)
Please contact the patient and cancel the PV and colonoscopy at this time  She needs to have BP and neuro sxs sorted out first - colonoscopy is elective - she can reschedule for later in year .

## 2015-07-01 NOTE — Telephone Encounter (Signed)
Dr Carlean Purl ,I spoke with the pt and she denies being seen in the ED yesterday. She states someone is using her name and falsifying her records.She did correctly verify her DOB and address with me, but she wants to talk to you or someone from [place]. I informed her I was calling from Dr Celesta Aver office. .I informed her she would  need to follow-up with her PCP for her B/P and reschedule her colonoscopy at a later date.She did not want to cancel her PV or her colonoscopy Pt is requesting for someone to call her.Please advise!

## 2015-07-01 NOTE — Telephone Encounter (Signed)
Dr. Carlean Purl,  This pt is coming in for a PV on 07-04-15 for a screening colonoscopy  On 07-25-15.  She was just seen in the ED yesterday for altered mental state.  I wanted to make you aware of this and see if ok to proceed as a direct?  Or wait til she follows up with her PCP?  Please advise,  Thanks, Cyril Mourning

## 2015-07-04 ENCOUNTER — Ambulatory Visit (AMBULATORY_SURGERY_CENTER): Payer: Self-pay

## 2015-07-04 VITALS — Ht 66.0 in | Wt 138.6 lb

## 2015-07-04 DIAGNOSIS — Z1211 Encounter for screening for malignant neoplasm of colon: Secondary | ICD-10-CM

## 2015-07-04 NOTE — Progress Notes (Signed)
No allergies to eggs or soy No home oxygen No past problems with anesthesia No diet meds  Has email and internet; refused emmi; has had test before

## 2015-07-18 DIAGNOSIS — Z85828 Personal history of other malignant neoplasm of skin: Secondary | ICD-10-CM | POA: Diagnosis not present

## 2015-07-18 DIAGNOSIS — R946 Abnormal results of thyroid function studies: Secondary | ICD-10-CM | POA: Diagnosis not present

## 2015-07-18 DIAGNOSIS — L400 Psoriasis vulgaris: Secondary | ICD-10-CM | POA: Diagnosis not present

## 2015-07-21 DIAGNOSIS — R51 Headache: Secondary | ICD-10-CM | POA: Diagnosis not present

## 2015-07-21 DIAGNOSIS — G4700 Insomnia, unspecified: Secondary | ICD-10-CM | POA: Diagnosis not present

## 2015-07-21 DIAGNOSIS — I10 Essential (primary) hypertension: Secondary | ICD-10-CM | POA: Diagnosis not present

## 2015-07-21 DIAGNOSIS — R946 Abnormal results of thyroid function studies: Secondary | ICD-10-CM | POA: Diagnosis not present

## 2015-07-21 DIAGNOSIS — R002 Palpitations: Secondary | ICD-10-CM | POA: Diagnosis not present

## 2015-07-22 ENCOUNTER — Telehealth: Payer: Self-pay | Admitting: Internal Medicine

## 2015-07-22 NOTE — Telephone Encounter (Signed)
Left message for pt to call back  °

## 2015-07-23 ENCOUNTER — Encounter: Payer: Medicare Other | Admitting: Internal Medicine

## 2015-07-23 NOTE — Telephone Encounter (Signed)
Pt states she is supposed to have a colon done this Friday. Pt reports that she had a spike in her BP Monday and was seen by her PCP Dr. Dagmar Hait. Pt states Dr. Dagmar Hait was supposed to send Dr. Carlean Purl a note regarding BP. Pt reports this am her BP was 186/110 but she has not taken her clonidine yet that Dr. Dagmar Hait gave her. Pt wants to know if she should still keep the colon as scheduled. Dr. Carlean Purl please advise.

## 2015-07-23 NOTE — Telephone Encounter (Signed)
I advised that she cancel and reschedule her colonoscopy so please cancel it. No charge. She will reschedule.

## 2015-07-24 NOTE — Telephone Encounter (Signed)
Colonoscopy appt cancelled.

## 2015-07-25 ENCOUNTER — Encounter: Payer: Medicare Other | Admitting: Internal Medicine

## 2015-08-06 ENCOUNTER — Ambulatory Visit: Payer: Medicare Other

## 2015-08-19 DIAGNOSIS — H01001 Unspecified blepharitis right upper eyelid: Secondary | ICD-10-CM | POA: Diagnosis not present

## 2015-08-19 DIAGNOSIS — H04123 Dry eye syndrome of bilateral lacrimal glands: Secondary | ICD-10-CM | POA: Diagnosis not present

## 2015-08-19 DIAGNOSIS — H2513 Age-related nuclear cataract, bilateral: Secondary | ICD-10-CM | POA: Diagnosis not present

## 2015-08-19 DIAGNOSIS — H01004 Unspecified blepharitis left upper eyelid: Secondary | ICD-10-CM | POA: Diagnosis not present

## 2015-08-20 ENCOUNTER — Ambulatory Visit
Admission: RE | Admit: 2015-08-20 | Discharge: 2015-08-20 | Disposition: A | Discharge status: Home / Self Care | Payer: Medicare Other | Source: Ambulatory Visit

## 2015-08-20 DIAGNOSIS — Z1231 Encounter for screening mammogram for malignant neoplasm of breast: Secondary | ICD-10-CM

## 2015-11-03 ENCOUNTER — Telehealth: Payer: Self-pay | Admitting: Internal Medicine

## 2015-11-03 NOTE — Telephone Encounter (Signed)
1 week history of diarrhea and mucus in stool, nausea, abdominal pain,  and a HA.  She has tried OTC products for diarrhea that improved diarrhea, but she still has nausea and abdominal pain.  She will come in and see Nicoletta Ba PA tomorrow at 1:30

## 2015-11-04 ENCOUNTER — Ambulatory Visit (INDEPENDENT_AMBULATORY_CARE_PROVIDER_SITE_OTHER): Payer: Medicare Other | Admitting: Physician Assistant

## 2015-11-04 ENCOUNTER — Encounter: Payer: Self-pay | Admitting: Physician Assistant

## 2015-11-04 ENCOUNTER — Other Ambulatory Visit (INDEPENDENT_AMBULATORY_CARE_PROVIDER_SITE_OTHER): Payer: Medicare Other

## 2015-11-04 VITALS — BP 148/80 | HR 64 | Ht 66.0 in | Wt 135.8 lb

## 2015-11-04 DIAGNOSIS — K529 Noninfective gastroenteritis and colitis, unspecified: Secondary | ICD-10-CM

## 2015-11-04 DIAGNOSIS — R109 Unspecified abdominal pain: Secondary | ICD-10-CM | POA: Diagnosis not present

## 2015-11-04 DIAGNOSIS — R110 Nausea: Secondary | ICD-10-CM | POA: Diagnosis not present

## 2015-11-04 LAB — CBC WITH DIFFERENTIAL/PLATELET
Basophils Absolute: 0 10*3/uL (ref 0.0–0.1)
Basophils Relative: 0.5 % (ref 0.0–3.0)
Eosinophils Absolute: 0.1 10*3/uL (ref 0.0–0.7)
Eosinophils Relative: 1 % (ref 0.0–5.0)
HCT: 39.5 % (ref 36.0–46.0)
HEMOGLOBIN: 13.4 g/dL (ref 12.0–15.0)
Lymphocytes Relative: 20.7 % (ref 12.0–46.0)
Lymphs Abs: 1.3 10*3/uL (ref 0.7–4.0)
MCHC: 33.8 g/dL (ref 30.0–36.0)
MCV: 91.9 fl (ref 78.0–100.0)
MONOS PCT: 7.5 % (ref 3.0–12.0)
Monocytes Absolute: 0.5 10*3/uL (ref 0.1–1.0)
Neutro Abs: 4.3 10*3/uL (ref 1.4–7.7)
Neutrophils Relative %: 70.3 % (ref 43.0–77.0)
Platelets: 241 10*3/uL (ref 150.0–400.0)
RBC: 4.3 Mil/uL (ref 3.87–5.11)
RDW: 13.6 % (ref 11.5–15.5)
WBC: 6.1 10*3/uL (ref 4.0–10.5)

## 2015-11-04 LAB — COMPREHENSIVE METABOLIC PANEL
ALBUMIN: 4.4 g/dL (ref 3.5–5.2)
ALK PHOS: 46 U/L (ref 39–117)
ALT: 7 U/L (ref 0–35)
AST: 12 U/L (ref 0–37)
BUN: 19 mg/dL (ref 6–23)
CO2: 30 mEq/L (ref 19–32)
Calcium: 9.8 mg/dL (ref 8.4–10.5)
Chloride: 105 mEq/L (ref 96–112)
Creatinine, Ser: 1 mg/dL (ref 0.40–1.20)
GFR: 58.47 mL/min — AB (ref >60.00)
Glucose, Bld: 111 mg/dL — ABNORMAL HIGH (ref 70–99)
POTASSIUM: 4.2 meq/L (ref 3.5–5.1)
Sodium: 141 mEq/L (ref 135–145)
TOTAL PROTEIN: 6.7 g/dL (ref 6.0–8.3)
Total Bilirubin: 0.7 mg/dL (ref 0.2–1.2)

## 2015-11-04 MED ORDER — ONDANSETRON 4 MG PO TBDP: ORAL_TABLET | ORAL | 0 refills | Status: DC

## 2015-11-04 MED ORDER — SACCHAROMYCES BOULARDII 250 MG PO CAPS: ORAL_CAPSULE | ORAL | 0 refills | Status: DC

## 2015-11-04 MED ORDER — ONDANSETRON HCL 4 MG PO TABS
ORAL_TABLET | ORAL | 0 refills | Status: DC
Start: 2015-11-04 — End: 2016-01-02

## 2015-11-04 NOTE — Progress Notes (Signed)
Subjective:    Patient ID: Brianna Harper, female    DOB: 03/15/1946, 70 y.o.   MRN: [id number]  HPI Brianna Harper is a pleasant 70 year old white female known to Dr. Carlean Purl. She has history of GERD, hypertension and hyperlipidemia. She did undergone an EGD in April 2016 with finding of a 2 cm hiatal hernia otherwise negative exam and colonoscopy was last done in November 2005 with finding of internal hemorrhoids otherwise negative exam. Patient comes in today with acute symptoms onset about 8 days ago with watery diarrhea. She had diarrhea for about 2 days which resolved after Imodium. She also had onset of nausea and a generalized "upset stomach" feeling. She has not had any vomiting. She has felt quite fatigued and has had a mild headache and intermittent sweats. No documented fever. This time her diarrhea has completely resolved that she continues to feel poorly, has been very nauseated and unable to eat or drink much. She has no appetite and says nothing sounds good. She had not been started on any new medications, no recent antibiotics, no travel or known infectious exposures.  Review of Systems Pertinent positive and negative review of systems were noted in the above HPI section.  All other review of systems was otherwise negative.  Outpatient Encounter Prescriptions as of 11/04/2015  Medication Sig  . cholecalciferol (VITAMIN D) 1000 UNITS tablet Take 2,000 Units by mouth daily.  . metoprolol tartrate (LOPRESSOR) 25 MG tablet Take 1 tablet (25 mg total) by mouth 2 (two) times daily.  Marland Kitchen VERAPAMIL HCL ER, CO, PO Take 240 mg by mouth.  . ondansetron (ZOFRAN ODT) 4 MG disintegrating tablet Take 1 tablet by mouth eery 6 hours as needed for nausea.  . ondansetron (ZOFRAN) 4 MG tablet Take 1 tab by mouth every 6 hours as needed for nausea.  Marland Kitchen saccharomyces boulardii (FLORASTOR) 250 MG capsule Take 1 tab twice daily for 2-4 weeks.  . [DISCONTINUED] omeprazole (PRILOSEC) 40 MG capsule Take 40 mg by  mouth daily before breakfast.    No facility-administered encounter medications on file as of 11/04/2015.    Allergies  Allergen Reactions  . Lisinopril Cough  . Erythromycin Ethylsuccinate Other (See Comments)    REACTION: GI upset  . Nitrofurantoin Hives and Swelling    macrobid   . Sulfadiazine Itching and Rash   Patient Active Problem List   Diagnosis Date Noted  . Laryngopharyngeal reflux (LPR) 06/18/2014  . Hypokalemia 06/18/2014  . Palpitations 06/18/2014  . Anxiety state 05/24/2014  . Atypical chest pain 04/22/2014  . Cough 04/09/2014  . Chest pain 03/14/2014  . Neck and shoulder pain 12/07/2013  . AC (acromioclavicular) arthritis 11/12/2013  . Acute onset of severe vertigo 09/20/2013  . Essential hypertension 09/20/2013  . Medication management 09/20/2013  . Subacromial bursitis 06/22/2013  . Shoulder pain, right 06/05/2013  . Arm pain, right 06/05/2013  . Acid reflux 05/04/2013  . Hyperglycemia 05/04/2013  . TGA (transient global amnesia) 04/25/2013  . Amnesia 04/15/2013  . Neck pain 03/09/2013  . Laryngitis from reflux of stomach acid- ? 01/01/2013  . Welcome to Medicare preventive visit 05/01/2012  . Leg cramp 05/01/2012  . Intermittent lightheadedness 04/03/2012  . Sinusitis 04/03/2012  . Visit for preventive health examination 07/18/2011  . Constipation 07/18/2011  . Hypertension 07/18/2011  . Urgency of urination 04/06/2011  . Sore throat 06/11/2010  . History of recurrent UTIs 06/11/2010  . OSTEOPENIA 05/18/2009  . Urticaria 07/10/2007  . HYPERLIPIDEMIA 10/28/2006  . ANXIETY 10/28/2006  .  DEPRESSION 10/28/2006  . Migraine 10/28/2006  . GERD 10/28/2006  . PMS 10/28/2006   Social History   Social History  . Marital status: Married    Spouse name: N/A  . Number of children: 2  . Years of education: college BS   Occupational History  . retired    Social History Main Topics  . Smoking status: Never Smoker  . Smokeless tobacco: Never Used    . Alcohol use 0.0 oz/week     Comment: extremely rare ; only on special occasion  . Drug use: No  . Sexual activity: Yes   Other Topics Concern  . Not on file   Social History Narrative   HHof 2    Non smoker   2 children  Also twins that did not survive birth   Married    CB x 2   No pets     Ms. Cavins's family history includes Arthritis in her father; Hypertension in her mother; Migraines in her mother; Ovarian cancer in her maternal grandmother.      Objective:    Vitals:   11/04/15 1339  BP: (!) 148/80  Pulse: 64    Physical Exam  well-developed older white female in no acute distress, accompanied by her husband blood pressure 148/80 pulse 64, weight 135 height 5 foot 6. HEENT; nontraumatic normocephalic EOMI PERRLA sclera anicteric, Cardiovascular; regular rate and rhythm with S1-S2 no murmur or gallop, Pulmonary; clear bilaterally and soft nontender nondistended bowel sounds are active is no palpable mass or hepatosplenomegaly, Rectal ;exam not done, Extremities ;no clubbing cyanosis or edema skin warm and dry, Neuropsych ;mood and affect appropriate      Assessment & Plan:   #66 70 year old female with 1 week history of acute illness initially with diarrhea which is now resolved, patient with persistent nausea, queasiness mild abdominal discomfort, fatigue mild headache and intermittent sweats. Symptoms are most consistent with an acute gastroenteritis likely viral #2 GERD-patient has been off of PPI therapy over the past several months as she was concerned about long-term side effects. She has been doing well without any regular reflux symptoms.  #3 Colon  Cancer screening-last colonoscopy 2005, due for follow-up  Plan; CBC with differential, see met Will order GI pathogen panel and asked her to submit only if diarrhea recurs Brat diet reviewed with the patient and her husband and asked her to push fluids Start Zofran 4 mg by mouth every 6 hours when necessary  for nausea Florastor one by mouth twice a day 2-3 weeks Patient is asked to call back in 4-5 days if she is not feeling significantly better. We will need to discuss follow-up colonoscopy with her, which was not done today in setting of her acute symptoms.  Christena Sunderlin S Harshika Mago PA-C 11/04/2015   Cc: Prince Solian, MD

## 2015-11-04 NOTE — Patient Instructions (Signed)
   Please go to the basement level to have your labs drawn.  We sent prescriptions to your pharmacy. 1. Zofran for nausea. 2. Florastor probiotic.  Call next Monday the 14th if you are not significantly betterl      Food Choices for Gastroesophageal Reflux Disease, Adult When you have gastroesophageal reflux disease (GERD), the foods you eat and your eating habits are very important. Choosing the right foods can help ease the discomfort of GERD. WHAT GENERAL GUIDELINES DO I NEED TO FOLLOW?  Choose fruits, vegetables, whole grains, low-fat dairy products, and low-fat meat, fish, and poultry.  Limit fats such as oils, salad dressings, butter, nuts, and avocado.  Keep a food diary to identify foods that cause symptoms.  Avoid foods that cause reflux. These may be different for different people.  Eat frequent small meals instead of three large meals each day.  Eat your meals slowly, in a relaxed setting.  Limit fried foods.  Cook foods using methods other than frying.  Avoid drinking alcohol.  Avoid drinking large amounts of liquids with your meals.  Avoid bending over or lying down until 2-3 hours after eating. WHAT FOODS ARE NOT RECOMMENDED? The following are some foods and drinks that may worsen your symptoms: Vegetables Tomatoes. Tomato juice. Tomato and spaghetti sauce. Chili peppers. Onion and garlic. Horseradish. Fruits Oranges, grapefruit, and lemon (fruit and juice). Meats High-fat meats, fish, and poultry. This includes hot dogs, ribs, ham, sausage, salami, and bacon. Dairy Whole milk and chocolate milk. Sour cream. Cream. Butter. Ice cream. Cream cheese.  Beverages Coffee and tea, with or without caffeine. Carbonated beverages or energy drinks. Condiments Hot sauce. Barbecue sauce.  Sweets/Desserts Chocolate and cocoa. Donuts. Peppermint and spearmint. Fats and Oils High-fat foods, including [place] fries and potato chips. Other Vinegar. Strong spices,  such as black pepper, white pepper, red pepper, cayenne, curry powder, cloves, ginger, and chili powder. The items listed above may not be a complete list of foods and beverages to avoid. Contact your dietitian for more information.   This information is not intended to replace advice given to you by your health care provider. Make sure you discuss any questions you have with your health care provider.   Document Released: 03/15/2005 Document Revised: 04/05/2014 Document Reviewed: 01/17/2013 Elsevier Interactive Patient Education [employer].

## 2015-11-05 ENCOUNTER — Telehealth: Payer: Self-pay | Admitting: Physician Assistant

## 2015-11-05 NOTE — Telephone Encounter (Signed)
Why dont you have her try OTC pepcid BID for 2 weeks ...as she did not want to be on a PPI long term because or side effects

## 2015-11-05 NOTE — Telephone Encounter (Signed)
Patient advised.

## 2015-11-05 NOTE — Telephone Encounter (Signed)
Called patient back about her worsening symptoms. She reports she ate a plain baked potato last night without problems. Zofran has helped with her nausea. She is having soft formed stool. She ate yogurt and drank coffee. Now says her stomach hurts again. She asks should she resume Omeprazole. Please advise.

## 2015-11-26 DIAGNOSIS — Z23 Encounter for immunization: Secondary | ICD-10-CM | POA: Diagnosis not present

## 2015-11-26 DIAGNOSIS — Z6822 Body mass index (BMI) 22.0-22.9, adult: Secondary | ICD-10-CM | POA: Diagnosis not present

## 2015-11-26 DIAGNOSIS — K219 Gastro-esophageal reflux disease without esophagitis: Secondary | ICD-10-CM | POA: Diagnosis not present

## 2015-11-26 DIAGNOSIS — I10 Essential (primary) hypertension: Secondary | ICD-10-CM | POA: Diagnosis not present

## 2015-11-26 DIAGNOSIS — G4700 Insomnia, unspecified: Secondary | ICD-10-CM | POA: Diagnosis not present

## 2015-11-30 NOTE — Progress Notes (Signed)
Agree with Ms. Genia Harold assessment and plan.  Will have staff contact her about setting up colonoscopy.  Gatha Mayer, MD, [facility]

## 2015-12-02 ENCOUNTER — Telehealth: Payer: Self-pay

## 2015-12-02 NOTE — Telephone Encounter (Signed)
Ok sounds good

## 2015-12-02 NOTE — Telephone Encounter (Signed)
Patient contacted and has not been cleared by her primary .  She has follow up with him in 2 months.  She wants to wait until after this appt to consider colonoscopy

## 2015-12-02 NOTE — Telephone Encounter (Signed)
-----   Message from Gatha Mayer, MD sent at 11/30/2015  8:03 AM EDT ----- Regarding: screening colonoscopy She had postponed this earlier in the year due to BP elevations  Please ask if she is ready and willing to reschedule and set up pre-visit if so

## 2015-12-30 ENCOUNTER — Telehealth: Payer: Self-pay | Admitting: Internal Medicine

## 2015-12-30 NOTE — Telephone Encounter (Signed)
Patient with diarrhea and abdominal pain after each meal.  She reports similar symptoms 6 weeks ago when she saw Nicoletta Ba PA .  She will come in and see Dr. Carlean Purl on 01/02/16 at 2:15

## 2016-01-02 ENCOUNTER — Ambulatory Visit (INDEPENDENT_AMBULATORY_CARE_PROVIDER_SITE_OTHER): Payer: Medicare Other | Admitting: Internal Medicine

## 2016-01-02 ENCOUNTER — Encounter: Payer: Self-pay | Admitting: Internal Medicine

## 2016-01-02 VITALS — BP 128/74 | HR 76 | Ht 66.0 in | Wt 134.0 lb

## 2016-01-02 DIAGNOSIS — R197 Diarrhea, unspecified: Secondary | ICD-10-CM

## 2016-01-02 DIAGNOSIS — I10 Essential (primary) hypertension: Secondary | ICD-10-CM | POA: Diagnosis not present

## 2016-01-02 DIAGNOSIS — R1032 Left lower quadrant pain: Secondary | ICD-10-CM | POA: Diagnosis not present

## 2016-01-02 NOTE — Progress Notes (Signed)
Brianna Harper 70 y.o. [birth year] [id number]  Assessment & Plan:   Encounter Diagnoses  Name Primary?  . Diarrhea, unspecified type Yes  . Essential hypertension    Worsening symptoms of diarrhea and also LLQ pain ? IBS ? IBD ? Side effect of melatonin (on the list) ? Neoplasia (doubt)  Low fiber diet  Try ondansetron before meals expecially breakfast 4-8 mg  Colonoscopy to evaluate The risks and benefits as well as alternatives of endoscopic procedure(s) have been discussed and reviewed. All questions answered. The patient agrees to proceed.  IB Gard prn  [id number] R, MD  Subjective:   Chief Complaint: diarrhea  HPI Seen in august for 1+ week of diarrjhea and nausea that resolved. Then ok until 5-6 days ago. Upset stomach Sat and Sun/Mon LLQ abd pain and loose stools Called and RN recommended IB Donald Prose which has alleviated pain but still having a formed and then progressively looser stools in AM 3-4 x post breakfast usu. Not nocturnal  She is past due for screening colonoscopy - has been on hold due to BP elevations No antibiotics No travel  BRAT diet x 1 week   Off omeprazole x 1 year and ok off that Allergies  Allergen Reactions  . Lisinopril Cough  . Erythromycin Ethylsuccinate Other (See Comments)    REACTION: GI upset  . Nitrofurantoin Hives and Swelling    macrobid   . Sulfadiazine Itching and Rash   Outpatient Medications Prior to Visit  Medication Sig Dispense Refill  . cholecalciferol (VITAMIN D) 1000 UNITS tablet Take 2,000 Units by mouth daily.    . metoprolol tartrate (LOPRESSOR) 25 MG tablet Take 1 tablet (25 mg total) by mouth 2 (two) times daily. 30 tablet 3  . VERAPAMIL HCL ER, CO, PO Take 240 mg by mouth.    . ondansetron (ZOFRAN ODT) 4 MG disintegrating tablet Take 1 tablet by mouth eery 6 hours as needed for nausea. 40 tablet 0  . ondansetron (ZOFRAN) 4 MG tablet Take 1 tab by mouth every 6 hours as needed for nausea. 40  tablet 0  . saccharomyces boulardii (FLORASTOR) 250 MG capsule Take 1 tab twice daily for 2-4 weeks. 60 capsule 0   No facility-administered medications prior to visit.    Past Medical History:  Diagnosis Date  . Anxiety   . Depression   . GERD (gastroesophageal reflux disease)   . Hyperlipidemia   . Hypertension   . Laryngitis   . Migraine    "not very often anymore; they were more menopausal" (04/16/2013)  . Osteopenia    dexa -2.0 hip 2008  . Rapid heart rate    hx of    Past Surgical History:  Procedure Laterality Date  . COLONOSCOPY  02/03/2004   internal hemorrhoids  . UPPER GASTROINTESTINAL ENDOSCOPY  08/29/2008   gastritis, hiatal hernia   Social History   Social History  . Marital status: Married    Spouse name: N/A  . Number of children: 2  . Years of education: college BS   Occupational History  . retired    Social History Main Topics  . Smoking status: Never Smoker  . Smokeless tobacco: Never Used  . Alcohol use 0.0 oz/week     Comment: extremely rare ; only on special occasion  . Drug use: No  . Sexual activity: Yes   Other Topics Concern  . None   Social History Narrative   HHof 2    Non smoker   2 children  Also twins that did not survive birth   Married    CB x 2   No pets    Family History  Problem Relation Age of Onset  . Hypertension Mother   . Migraines Mother   . Arthritis Father   . Ovarian cancer Maternal Grandmother   . Seizures Neg Hx   . Esophageal cancer Neg Hx   . Colon cancer Neg Hx   . Pancreatic cancer Neg Hx   . Stomach cancer Neg Hx   . Liver disease Neg Hx   . Inflammatory bowel disease Neg Hx        Review of Systems Mild back pain BP at home - 142/82   Objective:   Physical Exam @BP  128/74   Pulse 76   Ht 5\' 6"  (1.676 m)   Wt 134 lb (60.8 kg)   BMI 21.63 kg/m @  General:  NAD Eyes:   anicteric Lungs:  clear Abdomen:  soft and nontender, BS+ Ext:   no edema, cyanosis or clubbing    Data  Reviewed:  As per HPI

## 2016-01-02 NOTE — Patient Instructions (Addendum)
   You have been scheduled for a colonoscopy. Please follow written instructions given to you at your visit today.  Please pick up your prep supplies at the pharmacy. If you use inhalers (even only as needed), please bring them with you on the day of your procedure.  Today we are giving you samples of IBgard and a coupon to try.   Stop melatonin and see if that helps.    Try taking your zofran 4 to 8 mg before breakfast to see if that helps the diarrhea.   Today we are giving you a low fiber diet to read and follow.   I appreciate the opportunity to care for you. Silvano Rusk, MD, [facility]

## 2016-01-05 ENCOUNTER — Telehealth: Payer: Self-pay | Admitting: Internal Medicine

## 2016-01-05 DIAGNOSIS — R197 Diarrhea, unspecified: Secondary | ICD-10-CM

## 2016-01-05 NOTE — Telephone Encounter (Signed)
Patient notified .  If stools are watery tonight or tomorrow she will come pick up containers

## 2016-01-05 NOTE — Telephone Encounter (Signed)
Patient reports that she is still having diarrhea.  We moved up her colonoscopy to next Tuesday.  She wants to know what else you have to recommend? She has not tried the IBgard again.  I recommended that she try the IBgard and the zofran as recommended at the office visit on 01/02/16

## 2016-01-05 NOTE — Telephone Encounter (Signed)
If stools are watery have her do C diff PCR and GI pathogen panel

## 2016-01-06 ENCOUNTER — Telehealth: Payer: Self-pay

## 2016-01-06 NOTE — Telephone Encounter (Signed)
  Received a note back from Dr Dagmar Hait saying she's cleared BP wise for upcoming colon.  Pt informed.

## 2016-01-12 ENCOUNTER — Telehealth: Payer: Self-pay | Admitting: Internal Medicine

## 2016-01-12 NOTE — Telephone Encounter (Signed)
Called pt back, pt is actually having loose stools, not watery, pt states there is some formed stool, was going to have her pick up containers for test Dr Carlean Purl ordered but since stool is formed and not watery she can not do the labs that were ordered. Informed pt to go ahead and prep according to instructions given, no change in prep instructions for colonoscopy tomorrow, pt states understanding-adm

## 2016-01-13 ENCOUNTER — Ambulatory Visit (AMBULATORY_SURGERY_CENTER): Payer: Medicare Other | Admitting: Internal Medicine

## 2016-01-13 ENCOUNTER — Encounter: Payer: Self-pay | Admitting: Internal Medicine

## 2016-01-13 VITALS — BP 149/73 | HR 70 | Temp 98.2°F | Resp 14 | Ht 66.0 in | Wt 134.0 lb

## 2016-01-13 DIAGNOSIS — I10 Essential (primary) hypertension: Secondary | ICD-10-CM | POA: Diagnosis not present

## 2016-01-13 DIAGNOSIS — R197 Diarrhea, unspecified: Secondary | ICD-10-CM

## 2016-01-13 DIAGNOSIS — K529 Noninfective gastroenteritis and colitis, unspecified: Secondary | ICD-10-CM | POA: Diagnosis not present

## 2016-01-13 MED ORDER — DIPHENOXYLATE-ATROPINE 2.5-0.025 MG PO TABS: 1.0000 | ORAL_TABLET | Freq: Four times a day (QID) | ORAL | 0 refills | Status: DC | PRN

## 2016-01-13 MED ORDER — SODIUM CHLORIDE 0.9 % IV SOLN: 500.0000 mL | INTRAVENOUS | Status: DC

## 2016-01-13 NOTE — Op Note (Signed)
[facility] Patient Name: Brianna Harper Procedure Date: 01/13/2016 3:50 PM MRN: [id number] Endoscopist: Gatha Mayer , MD Age: 71 Referring MD:  Date of Birth: 03-22-1946 Gender: Female Account #: [id number] Procedure:                Colonoscopy Indications:              Clinically significant diarrhea of unexplained                            origin Medicines:                Propofol per Anesthesia, Monitored Anesthesia Care Procedure:                Pre-Anesthesia Assessment:                           - Prior to the procedure, a History and Physical                            was performed, and patient medications and                            allergies were reviewed. The patient's tolerance of                            previous anesthesia was also reviewed. The risks                            and benefits of the procedure and the sedation                            options and risks were discussed with the patient.                            All questions were answered, and informed consent                            was obtained. Prior Anticoagulants: The patient has                            taken no previous anticoagulant or antiplatelet                            agents. ASA Grade Assessment: III - A patient with                            severe systemic disease. After reviewing the risks                            and benefits, the patient was deemed in                            satisfactory condition to undergo the procedure.  After obtaining informed consent, the colonoscope                            was passed under direct vision. Throughout the                            procedure, the patient's blood pressure, pulse, and                            oxygen saturations were monitored continuously. The                            Model PCF-H190L [id number]) scope was introduced                            through the anus and  advanced to the the terminal                            ileum, with identification of the appendiceal                            orifice and IC valve. The quality of the bowel                            preparation was excellent. The colonoscopy was                            performed without difficulty. The patient tolerated                            the procedure well. The bowel preparation used was                            Miralax. The terminal ileum, ileocecal valve,                            appendiceal orifice, and rectum were photographed. Scope In: 4:04:12 PM Scope Out: 4:18:41 PM Scope Withdrawal Time: 0 hours 10 minutes 19 seconds  Total Procedure Duration: 0 hours 14 minutes 29 seconds  Findings:                 The perianal and digital rectal examinations were                            normal.                           The colon (entire examined portion) appeared normal.                           No additional abnormalities were found on                            retroflexion.  The terminal ileum appeared normal.                           Biopsies for histology were taken with a cold                            forceps from the right colon and left colon for                            evaluation of microscopic colitis. Complications:            No immediate complications. Estimated blood loss:                            None. Estimated Blood Loss:     Estimated blood loss: none. Impression:               - The entire examined colon is normal.                           - The examined portion of the ileum was normal.                           - Biopsies were taken with a cold forceps from the                            right colon and left colon for evaluation of                            microscopic colitis. Recommendation:           - Repeat colonoscopy in 10 years for screening                            purposes.                           -  Patient has a contact number available for                            emergencies. The signs and symptoms of potential                            delayed complications were discussed with the                            patient. Return to normal activities tomorrow.                            Written discharge instructions were provided to the                            patient.                           - Resume previous diet.                           -  Continue present medications.                           - Resume previous diet.                           - Continue present medications.                           -                           RX FOR DIPHENOXYLATE AND ATROPINE 1 QID PRN # 30                           THINK IBS LIKELY - SUSPECT SOME ANXIETY OVERLAY                            THAT MIGHT ALSO BE CONTRIBUTING TO HER ELEVATED BP                            ISSUES Gatha Mayer, MD 01/13/2016 4:31:02 PM This report has been signed electronically.

## 2016-01-13 NOTE — Progress Notes (Signed)
Pt's blood pressure on arrival- right arm- 196/120, left arm- 168/108.  I did manually check her BP- both arms about 168/108.  Dr. Carlean Purl and Waynard Edwards CRNA both made aware of this.  Dr. Carlean Purl in to see pt and ok to proceed.

## 2016-01-13 NOTE — Progress Notes (Signed)
Called to room to assist during endoscopic procedure.  Patient ID and intended procedure confirmed with present staff. Received instructions for my participation in the procedure from the performing physician.  

## 2016-01-13 NOTE — Progress Notes (Signed)
To recovery, report to Tyrell, RN, VSS. 

## 2016-01-13 NOTE — Patient Instructions (Addendum)
Things look ok. This is most likely IBS - Irritable Bowel syndrome. Anxiety/stress could be playing a role in this and with blood pressure elevation.  I took biopsies to see if there is microscopic colitis - colon inflammation only visible microscope. Will let you know.  I am prescribing a medication called diphenoxylate and atropine to see if it helps the diarrhea - can take as needed after diarrhea or perhaps 1 in AM - would not take if no bowel movement in 24 hrs occurs. It is to treat symptoms - will not fix anything.  I appreciate the opportunity to care for you. Gatha Mayer, MD, FACG   YOU HAD AN ENDOSCOPIC PROCEDURE TODAY AT [facility]:   Refer to the procedure report that was given to you for any specific questions about what was found during the examination.  If the procedure report does not answer your questions, please call your gastroenterologist to clarify.  If you requested that your care partner not be given the details of your procedure findings, then the procedure report has been included in a sealed envelope for you to review at your convenience later.  YOU SHOULD EXPECT: Some feelings of bloating in the abdomen. Passage of more gas than usual.  Walking can help get rid of the air that was put into your GI tract during the procedure and reduce the bloating. If you had a lower endoscopy (such as a colonoscopy or flexible sigmoidoscopy) you may notice spotting of blood in your stool or on the toilet paper. If you underwent a bowel prep for your procedure, you may not have a normal bowel movement for a few days.  Please Note:  You might notice some irritation and congestion in your nose or some drainage.  This is from the oxygen used during your procedure.  There is no need for concern and it should clear up in a day or so.  SYMPTOMS TO REPORT IMMEDIATELY:   Following lower endoscopy (colonoscopy or flexible sigmoidoscopy):  Excessive amounts of  blood in the stool  Significant tenderness or worsening of abdominal pains  Swelling of the abdomen that is new, acute  Fever of 100F or higher   For urgent or emergent issues, a gastroenterologist can be reached at any hour by calling [phone].   DIET:  We do recommend a small meal at first, but then you may proceed to your regular diet.  Drink plenty of fluids but you should avoid alcoholic beverages for 24 hours.  ACTIVITY:  You should plan to take it easy for the rest of today and you should NOT DRIVE or use heavy machinery until tomorrow (because of the sedation medicines used during the test).    FOLLOW UP: Our staff will call the number listed on your records the next business day following your procedure to check on you and address any questions or concerns that you may have regarding the information given to you following your procedure. If we do not reach you, we will leave a message.  However, if you are feeling well and you are not experiencing any problems, there is no need to return our call.  We will assume that you have returned to your regular daily activities without incident.  If any biopsies were taken you will be contacted by phone or by letter within the next 1-3 weeks.  Please call us at [phone] if you have not heard about the biopsies in 3 weeks.  SIGNATURES/CONFIDENTIALITY: You and/or your care partner have signed paperwork which will be entered into your electronic medical record.  These signatures attest to the fact that that the information above on your After Visit Summary has been reviewed and is understood.  Full responsibility of the confidentiality of this discharge information lies with you and/or your care-partner.

## 2016-01-14 ENCOUNTER — Telehealth: Payer: Self-pay

## 2016-01-14 NOTE — Telephone Encounter (Signed)
  Follow up Call-  Call back number 01/13/2016 05/22/2014  Post procedure Call Back phone  # [phone]  Permission to leave phone message Yes Yes  Some recent data might be hidden     Patient questions:  Do you have a fever, pain , or abdominal swelling? No. Pain Score  0 *  Have you tolerated food without any problems? Yes.    Have you been able to return to your normal activities? Yes.    Do you have any questions about your discharge instructions: Diet   No. Medications  No. Follow up visit  No.  Do you have questions or concerns about your Care? No.  Actions: * If pain score is 4 or above: No action needed, pain <4.  No problems per the pt. maw

## 2016-01-15 ENCOUNTER — Encounter: Payer: Medicare Other | Admitting: Internal Medicine

## 2016-01-15 MED ORDER — SODIUM CHLORIDE 0.9 % IV SOLN: 500.0000 mL | INTRAVENOUS | Status: DC

## 2016-01-19 NOTE — Progress Notes (Signed)
I called results- bxs show acute focal colitis - very non-specific She is better 1 formed stool/day  Working dx remains IBS  No letter needed Colon recall 10 yrs

## 2016-01-28 DIAGNOSIS — R51 Headache: Secondary | ICD-10-CM | POA: Diagnosis not present

## 2016-01-28 DIAGNOSIS — G4700 Insomnia, unspecified: Secondary | ICD-10-CM | POA: Diagnosis not present

## 2016-01-28 DIAGNOSIS — K219 Gastro-esophageal reflux disease without esophagitis: Secondary | ICD-10-CM | POA: Diagnosis not present

## 2016-01-28 DIAGNOSIS — K529 Noninfective gastroenteritis and colitis, unspecified: Secondary | ICD-10-CM | POA: Diagnosis not present

## 2016-01-28 DIAGNOSIS — Z6822 Body mass index (BMI) 22.0-22.9, adult: Secondary | ICD-10-CM | POA: Diagnosis not present

## 2016-01-28 DIAGNOSIS — I10 Essential (primary) hypertension: Secondary | ICD-10-CM | POA: Diagnosis not present

## 2016-02-09 DIAGNOSIS — L814 Other melanin hyperpigmentation: Secondary | ICD-10-CM | POA: Diagnosis not present

## 2016-02-09 DIAGNOSIS — D225 Melanocytic nevi of trunk: Secondary | ICD-10-CM | POA: Diagnosis not present

## 2016-02-09 DIAGNOSIS — D1801 Hemangioma of skin and subcutaneous tissue: Secondary | ICD-10-CM | POA: Diagnosis not present

## 2016-02-09 DIAGNOSIS — L400 Psoriasis vulgaris: Secondary | ICD-10-CM | POA: Diagnosis not present

## 2016-02-09 DIAGNOSIS — L821 Other seborrheic keratosis: Secondary | ICD-10-CM | POA: Diagnosis not present

## 2016-02-09 DIAGNOSIS — Z85828 Personal history of other malignant neoplasm of skin: Secondary | ICD-10-CM | POA: Diagnosis not present

## 2016-02-13 ENCOUNTER — Telehealth: Payer: Self-pay | Admitting: Internal Medicine

## 2016-02-13 ENCOUNTER — Encounter: Payer: Self-pay | Admitting: Internal Medicine

## 2016-02-13 NOTE — Telephone Encounter (Signed)
Patient is having abdominal pain.  Her diarrhea has resolved.  She is having intermittent abdominal pain. She reports that she is having mid abdomen pain.  She has no antispasmodics prescribed for her IBS.  She is asking what she can do until her appt in January for the pain?

## 2016-02-13 NOTE — Telephone Encounter (Signed)
ERROR

## 2016-02-16 MED ORDER — DICYCLOMINE HCL 20 MG PO TABS: 20.0000 mg | ORAL_TABLET | Freq: Four times a day (QID) | ORAL | 1 refills | Status: DC | PRN

## 2016-02-16 NOTE — Telephone Encounter (Signed)
Dicyclomine 20 mg 1 evety 6 hrs prn abd pain # 60 1 RF  OK to bypass elderly warning

## 2016-02-16 NOTE — Telephone Encounter (Signed)
Patient notified

## 2016-03-11 DIAGNOSIS — Z124 Encounter for screening for malignant neoplasm of cervix: Secondary | ICD-10-CM | POA: Diagnosis not present

## 2016-03-17 ENCOUNTER — Telehealth: Payer: Self-pay | Admitting: Internal Medicine

## 2016-03-18 NOTE — Telephone Encounter (Signed)
Pt states she has a flare of colitis and is traveling a lot over the next 3 weeks, dicyclomine helps while she takes it.  She wants to make sure its ok that she continues. She is advised that she can take the bentyl as directed. Pt advised that she can call if she needs further refills. Pt will keep f.u as scheduled with Dr Carlean Purl on 04/20/16

## 2016-03-18 NOTE — Telephone Encounter (Signed)
Left message on machine to call back  

## 2016-04-02 DIAGNOSIS — R05 Cough: Secondary | ICD-10-CM | POA: Diagnosis not present

## 2016-04-02 DIAGNOSIS — J029 Acute pharyngitis, unspecified: Secondary | ICD-10-CM | POA: Diagnosis not present

## 2016-04-02 MED ADMIN — ibuprofen (ADVIL,MOTRIN) tablet 600 mg: 600 mg | ORAL | @ 14:00:00 | NDC 00904585461

## 2016-04-02 MED ADMIN — famotidine (PEPCID) tablet 20 mg: 20 mg | ORAL | @ 14:00:00 | NDC 51079096601

## 2016-04-02 MED ADMIN — dexamethasone (DECADRON) injection 10 mg: INTRAVENOUS | @ 14:00:00 | NDC 55150023805

## 2016-04-02 NOTE — ED Provider Notes (Signed)
ED First Attending       History     Chief Complaint   Patient presents with    Cough     Dry cough x 1 wk, sore throat x 2-3 days, painful to swallow.  + sob.  No cp/n/v/d/f/c.  [place]        History provided by:  Patient   History limited by: nothing    HPI  This is a 70yof hx of GERD, HTN,    here for URI x1 [place] from [place]  Thought it would get better  Now with throat pai and swelling, globus sensation  No fevers, no chills  Husband sick with similar sx  + cough, congestion, HA   [facility]  + sob mild but no chest pain  No n/v/d/abd pain  + flu shot   Poor apetitie  + painful to swallow  Hoarse voice  + able to swallow  No drooling   No fever  Allergies   Allergen Reactions    Lisinopril Cough    Nitrofurantoin Monohyd/M-Cryst Hives     Lip swelling    Sulfa (Sulfonamide Antibiotics) Rash       Previous Medications    No medications on file       No past medical history on file.    No past surgical history on file.    Social History     Social History    Marital status: Married     Spouse name: N/A    Number of children: N/A    Years of education: N/A     Occupational History    Not on file.     Social History Main Topics    Smoking status: Not on file    Smokeless tobacco: Not on file    Alcohol use Not on file    Drug use: Unknown    Sexual activity: Not on file     Other Topics Concern    Not on file     Social History Narrative    No narrative on file         No family history on file.    History, Medications and Nursing Notes were reviewed by Frazier Butthloe Evelyn Jamiyla Ishee    Review of Systems     Review of Systems   Constitutional: Negative for chills, fatigue and fever.   HENT: Negative for rhinorrhea and sore throat.    Eyes: Negative for photophobia and visual disturbance.   Respiratory: Negative for cough and shortness of breath.    Cardiovascular: Negative for chest pain.   Gastrointestinal: Negative for abdominal pain, diarrhea, nausea and  vomiting.   Endocrine: Negative for polyuria.   Genitourinary: Negative for dysuria and hematuria.   Musculoskeletal: Negative for neck stiffness.   Skin: Negative for rash.   Allergic/Immunologic: Negative for immunocompromised state.   Neurological: Negative for seizures, facial asymmetry, speech difficulty and numbness.   Psychiatric/Behavioral: Negative for confusion.       Physical Exam   Triage Vital Signs:  BP: (!) 184/91, Pulse - Palpated/Pleth: 88, Temp: 37.2 C (99 F), Resp: 16, SpO2: 96 %    Physical Exam   Nursing note and vitals reviewed.  Constitutional: She is oriented to person, place, and time. She appears well-developed and well-nourished. No distress.   HENT:   Head: Normocephalic and atraumatic.   Right Ear: External ear normal.   Left Ear: External ear normal.   Nose: Nose  normal.   Mild OP erythema w/o any exudate   Eyes: Conjunctivae and EOM are normal. No scleral icterus.   Neck: Normal range of motion. Neck supple. No tracheal deviation present.   Full ROM of neck   Cardiovascular: Normal rate, regular rhythm and normal heart sounds.  Exam reveals no gallop and no friction rub.    No murmur heard.  Pulmonary/Chest: Effort normal and breath sounds normal. No stridor. No respiratory distress. She has no wheezes. She has no rales. She exhibits no tenderness.   Abdominal: Soft. She exhibits no distension and no mass. There is no tenderness. There is no rebound and no guarding.   Musculoskeletal: Normal range of motion. She exhibits no edema or tenderness.   Lymphadenopathy:     She has no cervical adenopathy.   Neurological: She is alert and oriented to person, place, and time.   Skin: Skin is warm and dry. No rash noted. She is not diaphoretic.   Psychiatric: She has a normal mood and affect. Her behavior is normal. Judgment and thought content normal.         Interpretations:  Lab, Imaging, EKG & Rhythm Strip       ED Course (Document differential diagnosis, ED treatment, response to  treatment, reasons for choice of disposition, and whether further outpatient workup is needed or if patient is going to OR or ICU)     ED Course      Pt is a 71 y.o. female here complaining of URI sx and sore throat, well appearing here w/ nml vitals, full ROM or neck and does not appear systemically ill so doubt RPA, doubt meningitis, no e/o of strep or airway obstruction. Likely laryngitis with possible globus sensation from GERD, cxr done at triage negative for PNA. No e/o of bacterial tracheitis given very well appearing. Plan for basic labs given age, sx treatment w/ dex and ibuprofen    Reassessment   Pt reports feeling better dc home.     Coding and Billing Info     MDM               Frazier Butt, MD  Resident  04/02/16 [phone]       Tharon Aquas, MD  04/02/16 [phone]

## 2016-04-05 DIAGNOSIS — J209 Acute bronchitis, unspecified: Secondary | ICD-10-CM | POA: Diagnosis not present

## 2016-04-05 DIAGNOSIS — R05 Cough: Secondary | ICD-10-CM | POA: Diagnosis not present

## 2016-04-05 DIAGNOSIS — Z6821 Body mass index (BMI) 21.0-21.9, adult: Secondary | ICD-10-CM | POA: Diagnosis not present

## 2016-04-16 DIAGNOSIS — R8299 Other abnormal findings in urine: Secondary | ICD-10-CM | POA: Diagnosis not present

## 2016-04-16 DIAGNOSIS — I10 Essential (primary) hypertension: Secondary | ICD-10-CM | POA: Diagnosis not present

## 2016-04-20 ENCOUNTER — Ambulatory Visit (INDEPENDENT_AMBULATORY_CARE_PROVIDER_SITE_OTHER): Payer: Medicare Other | Admitting: Internal Medicine

## 2016-04-20 ENCOUNTER — Encounter: Payer: Self-pay | Admitting: Internal Medicine

## 2016-04-20 VITALS — BP 128/80 | HR 76 | Ht 66.0 in | Wt 135.0 lb

## 2016-04-20 DIAGNOSIS — K580 Irritable bowel syndrome with diarrhea: Secondary | ICD-10-CM

## 2016-04-20 NOTE — Patient Instructions (Signed)
   We are glad your better from your IBS infection.    Follow up with Dr Carlean Purl as needed.    I appreciate the opportunity to care for you. Silvano Rusk, MD, [facility]

## 2016-04-20 NOTE — Progress Notes (Signed)
   Brianna Harper 71 y.o. [birth year] [id number]  Assessment & Plan:   Encounter Diagnosis  Name Primary?  . 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 We reviewed her biopsies taken last fall - she had a misunderstanding - though she did have a microscopic colitis it was focal acute in nature and non-specific possibly due to the prep. 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   I appreciate the opportunity to care for this patient. [id number] R, MD    Subjective:   Chief Complaint: f/u after colonoscopy for diarrhea ? Microscopic colitis  HPI Had a terrible time with diarrhea late Summer and Fall 2017 - now stopped and back to 2 stools/week - formed and baseline and feels well. I had called her and told her I thought it was post-infectious IBS but she has focused on the focal acute colitis in some random bxs.  Medications, allergies, past medical history, past surgical history, family history and social history are reviewed and updated in the EMR.   Review of Systems As above, BP much better  Objective:   Physical Exam BP 128/80   Pulse 76   Ht 5\' 6"  (1.676 m)   Wt 135 lb (61.2 kg)   SpO2 98%   BMI 21.79 kg/m  NAD   15 minutes time spent with patient > half in counseling coordination of care

## 2016-04-21 DIAGNOSIS — J309 Allergic rhinitis, unspecified: Secondary | ICD-10-CM | POA: Diagnosis not present

## 2016-04-21 DIAGNOSIS — G4700 Insomnia, unspecified: Secondary | ICD-10-CM | POA: Diagnosis not present

## 2016-04-21 DIAGNOSIS — I10 Essential (primary) hypertension: Secondary | ICD-10-CM | POA: Diagnosis not present

## 2016-04-21 DIAGNOSIS — K219 Gastro-esophageal reflux disease without esophagitis: Secondary | ICD-10-CM | POA: Diagnosis not present

## 2016-04-21 DIAGNOSIS — J209 Acute bronchitis, unspecified: Secondary | ICD-10-CM | POA: Diagnosis not present

## 2016-04-21 DIAGNOSIS — K529 Noninfective gastroenteritis and colitis, unspecified: Secondary | ICD-10-CM | POA: Diagnosis not present

## 2016-04-21 DIAGNOSIS — Z1389 Encounter for screening for other disorder: Secondary | ICD-10-CM | POA: Diagnosis not present

## 2016-04-21 DIAGNOSIS — R002 Palpitations: Secondary | ICD-10-CM | POA: Diagnosis not present

## 2016-04-21 DIAGNOSIS — Z6822 Body mass index (BMI) 22.0-22.9, adult: Secondary | ICD-10-CM | POA: Diagnosis not present

## 2016-04-21 DIAGNOSIS — Z0000 Encounter for general adult medical examination without abnormal findings: Secondary | ICD-10-CM | POA: Diagnosis not present

## 2016-07-15 ENCOUNTER — Other Ambulatory Visit: Payer: Self-pay | Admitting: Internal Medicine

## 2016-07-15 DIAGNOSIS — Z1231 Encounter for screening mammogram for malignant neoplasm of breast: Secondary | ICD-10-CM

## 2016-07-16 DIAGNOSIS — R300 Dysuria: Secondary | ICD-10-CM | POA: Diagnosis not present

## 2016-08-13 DIAGNOSIS — D1801 Hemangioma of skin and subcutaneous tissue: Secondary | ICD-10-CM | POA: Diagnosis not present

## 2016-08-13 DIAGNOSIS — D280 Benign neoplasm of vulva: Secondary | ICD-10-CM | POA: Diagnosis not present

## 2016-09-01 DIAGNOSIS — H2513 Age-related nuclear cataract, bilateral: Secondary | ICD-10-CM | POA: Diagnosis not present

## 2016-09-01 DIAGNOSIS — H01004 Unspecified blepharitis left upper eyelid: Secondary | ICD-10-CM | POA: Diagnosis not present

## 2016-09-01 DIAGNOSIS — H01001 Unspecified blepharitis right upper eyelid: Secondary | ICD-10-CM | POA: Diagnosis not present

## 2016-09-03 ENCOUNTER — Ambulatory Visit
Admission: RE | Admit: 2016-09-03 | Discharge: 2016-09-03 | Disposition: A | Discharge status: Home / Self Care | Payer: Medicare Other | Source: Ambulatory Visit | Attending: Internal Medicine | Admitting: Internal Medicine

## 2016-09-03 DIAGNOSIS — Z1231 Encounter for screening mammogram for malignant neoplasm of breast: Secondary | ICD-10-CM | POA: Diagnosis not present

## 2016-09-22 DIAGNOSIS — J384 Edema of larynx: Secondary | ICD-10-CM | POA: Diagnosis not present

## 2016-09-22 DIAGNOSIS — J383 Other diseases of vocal cords: Secondary | ICD-10-CM | POA: Diagnosis not present

## 2016-09-22 DIAGNOSIS — R05 Cough: Secondary | ICD-10-CM | POA: Diagnosis not present

## 2016-09-22 DIAGNOSIS — F458 Other somatoform disorders: Secondary | ICD-10-CM | POA: Diagnosis not present

## 2016-10-28 DIAGNOSIS — I10 Essential (primary) hypertension: Secondary | ICD-10-CM | POA: Diagnosis not present

## 2016-10-28 DIAGNOSIS — Z6822 Body mass index (BMI) 22.0-22.9, adult: Secondary | ICD-10-CM | POA: Diagnosis not present

## 2016-10-28 DIAGNOSIS — R002 Palpitations: Secondary | ICD-10-CM | POA: Diagnosis not present

## 2016-10-28 DIAGNOSIS — K219 Gastro-esophageal reflux disease without esophagitis: Secondary | ICD-10-CM | POA: Diagnosis not present

## 2016-12-25 DIAGNOSIS — Z23 Encounter for immunization: Secondary | ICD-10-CM | POA: Diagnosis not present

## 2017-01-06 DIAGNOSIS — R0789 Other chest pain: Secondary | ICD-10-CM | POA: Diagnosis not present

## 2017-01-06 DIAGNOSIS — Z6822 Body mass index (BMI) 22.0-22.9, adult: Secondary | ICD-10-CM | POA: Diagnosis not present

## 2017-01-06 DIAGNOSIS — K219 Gastro-esophageal reflux disease without esophagitis: Secondary | ICD-10-CM | POA: Diagnosis not present

## 2017-02-09 DIAGNOSIS — D1801 Hemangioma of skin and subcutaneous tissue: Secondary | ICD-10-CM | POA: Diagnosis not present

## 2017-02-09 DIAGNOSIS — L298 Other pruritus: Secondary | ICD-10-CM | POA: Diagnosis not present

## 2017-02-09 DIAGNOSIS — L821 Other seborrheic keratosis: Secondary | ICD-10-CM | POA: Diagnosis not present

## 2017-02-09 DIAGNOSIS — I788 Other diseases of capillaries: Secondary | ICD-10-CM | POA: Diagnosis not present

## 2017-02-09 DIAGNOSIS — L819 Disorder of pigmentation, unspecified: Secondary | ICD-10-CM | POA: Diagnosis not present

## 2017-02-09 DIAGNOSIS — L400 Psoriasis vulgaris: Secondary | ICD-10-CM | POA: Diagnosis not present

## 2017-02-09 DIAGNOSIS — L814 Other melanin hyperpigmentation: Secondary | ICD-10-CM | POA: Diagnosis not present

## 2017-02-09 DIAGNOSIS — D2271 Melanocytic nevi of right lower limb, including hip: Secondary | ICD-10-CM | POA: Diagnosis not present

## 2017-02-09 DIAGNOSIS — L570 Actinic keratosis: Secondary | ICD-10-CM | POA: Diagnosis not present

## 2017-04-25 DIAGNOSIS — R82998 Other abnormal findings in urine: Secondary | ICD-10-CM | POA: Diagnosis not present

## 2017-04-25 DIAGNOSIS — R946 Abnormal results of thyroid function studies: Secondary | ICD-10-CM | POA: Diagnosis not present

## 2017-04-25 DIAGNOSIS — I10 Essential (primary) hypertension: Secondary | ICD-10-CM | POA: Diagnosis not present

## 2017-05-02 DIAGNOSIS — K219 Gastro-esophageal reflux disease without esophagitis: Secondary | ICD-10-CM | POA: Diagnosis not present

## 2017-05-02 DIAGNOSIS — R0789 Other chest pain: Secondary | ICD-10-CM | POA: Diagnosis not present

## 2017-05-02 DIAGNOSIS — M25551 Pain in right hip: Secondary | ICD-10-CM | POA: Diagnosis not present

## 2017-05-02 DIAGNOSIS — Z6822 Body mass index (BMI) 22.0-22.9, adult: Secondary | ICD-10-CM | POA: Diagnosis not present

## 2017-05-02 DIAGNOSIS — G4709 Other insomnia: Secondary | ICD-10-CM | POA: Diagnosis not present

## 2017-05-02 DIAGNOSIS — Z0000 Encounter for general adult medical examination without abnormal findings: Secondary | ICD-10-CM | POA: Diagnosis not present

## 2017-05-02 DIAGNOSIS — Z1389 Encounter for screening for other disorder: Secondary | ICD-10-CM | POA: Diagnosis not present

## 2017-05-02 DIAGNOSIS — J3089 Other allergic rhinitis: Secondary | ICD-10-CM | POA: Diagnosis not present

## 2017-05-02 DIAGNOSIS — I10 Essential (primary) hypertension: Secondary | ICD-10-CM | POA: Diagnosis not present

## 2017-05-04 DIAGNOSIS — Z1212 Encounter for screening for malignant neoplasm of rectum: Secondary | ICD-10-CM | POA: Diagnosis not present

## 2017-06-18 ENCOUNTER — Observation Stay (HOSPITAL_BASED_OUTPATIENT_CLINIC_OR_DEPARTMENT_OTHER)
Admission: EM | Admit: 2017-06-18 | Discharge: 2017-06-18 | Disposition: A | Discharge status: Home / Self Care | Payer: Medicare Other | Attending: Internal Medicine | Admitting: Internal Medicine

## 2017-06-18 ENCOUNTER — Emergency Department (HOSPITAL_BASED_OUTPATIENT_CLINIC_OR_DEPARTMENT_OTHER): Payer: Medicare Other

## 2017-06-18 ENCOUNTER — Encounter (HOSPITAL_BASED_OUTPATIENT_CLINIC_OR_DEPARTMENT_OTHER): Payer: Self-pay | Admitting: Emergency Medicine

## 2017-06-18 ENCOUNTER — Other Ambulatory Visit: Payer: Self-pay

## 2017-06-18 DIAGNOSIS — I160 Hypertensive urgency: Secondary | ICD-10-CM | POA: Diagnosis not present

## 2017-06-18 DIAGNOSIS — E876 Hypokalemia: Secondary | ICD-10-CM | POA: Diagnosis not present

## 2017-06-18 DIAGNOSIS — K219 Gastro-esophageal reflux disease without esophagitis: Secondary | ICD-10-CM | POA: Insufficient documentation

## 2017-06-18 DIAGNOSIS — F419 Anxiety disorder, unspecified: Secondary | ICD-10-CM | POA: Insufficient documentation

## 2017-06-18 DIAGNOSIS — Z888 Allergy status to other drugs, medicaments and biological substances status: Secondary | ICD-10-CM | POA: Insufficient documentation

## 2017-06-18 DIAGNOSIS — Z882 Allergy status to sulfonamides status: Secondary | ICD-10-CM | POA: Diagnosis not present

## 2017-06-18 DIAGNOSIS — Z79899 Other long term (current) drug therapy: Secondary | ICD-10-CM | POA: Insufficient documentation

## 2017-06-18 DIAGNOSIS — F329 Major depressive disorder, single episode, unspecified: Secondary | ICD-10-CM | POA: Insufficient documentation

## 2017-06-18 DIAGNOSIS — Z881 Allergy status to other antibiotic agents status: Secondary | ICD-10-CM | POA: Diagnosis not present

## 2017-06-18 DIAGNOSIS — Z87440 Personal history of urinary (tract) infections: Secondary | ICD-10-CM | POA: Diagnosis not present

## 2017-06-18 DIAGNOSIS — I10 Essential (primary) hypertension: Secondary | ICD-10-CM | POA: Insufficient documentation

## 2017-06-18 DIAGNOSIS — R51 Headache: Secondary | ICD-10-CM | POA: Diagnosis not present

## 2017-06-18 DIAGNOSIS — R0789 Other chest pain: Secondary | ICD-10-CM | POA: Diagnosis not present

## 2017-06-18 DIAGNOSIS — R079 Chest pain, unspecified: Secondary | ICD-10-CM | POA: Diagnosis present

## 2017-06-18 DIAGNOSIS — E785 Hyperlipidemia, unspecified: Secondary | ICD-10-CM | POA: Insufficient documentation

## 2017-06-18 DIAGNOSIS — M8580 Other specified disorders of bone density and structure, unspecified site: Secondary | ICD-10-CM | POA: Diagnosis not present

## 2017-06-18 DIAGNOSIS — R0602 Shortness of breath: Secondary | ICD-10-CM | POA: Diagnosis not present

## 2017-06-18 DIAGNOSIS — R519 Headache, unspecified: Secondary | ICD-10-CM

## 2017-06-18 LAB — TROPONIN I: Troponin I: <0.03 ng/mL (ref <0.03)

## 2017-06-18 LAB — CBC
HCT: 42.1 % (ref 36.0–46.0)
Hemoglobin: 14.4 g/dL (ref 12.0–15.0)
MCH: 31.9 pg (ref 26.0–34.0)
MCHC: 34.2 g/dL (ref 30.0–36.0)
MCV: 93.1 fL (ref 78.0–100.0)
Platelets: 230 10*3/uL (ref 150–400)
RBC: 4.52 MIL/uL (ref 3.87–5.11)
RDW: 12.5 % (ref 11.5–15.5)
WBC: 5.2 10*3/uL (ref 4.0–10.5)

## 2017-06-18 LAB — BASIC METABOLIC PANEL WITH GFR
Anion gap: 10 (ref 5–15)
BUN: 13 mg/dL (ref 6–20)
CO2: 26 mmol/L (ref 22–32)
Calcium: 9.7 mg/dL (ref 8.9–10.3)
Chloride: 105 mmol/L (ref 101–111)
Creatinine, Ser: 0.9 mg/dL (ref 0.44–1.00)
GFR calc Af Amer: >60 mL/min
GFR calc non Af Amer: >60 mL/min
Glucose, Bld: 98 mg/dL (ref 65–99)
Potassium: 3.9 mmol/L (ref 3.5–5.1)
Sodium: 141 mmol/L (ref 135–145)

## 2017-06-18 MED ORDER — LABETALOL HCL 5 MG/ML IV SOLN
INTRAVENOUS | Status: AC
  Filled 2017-06-18: qty 4

## 2017-06-18 MED ORDER — AMLODIPINE BESYLATE 5 MG PO TABS
10.0000 mg | ORAL_TABLET | Freq: Once | ORAL | Status: DC
  Filled 2017-06-18: qty 2

## 2017-06-18 MED ORDER — METOCLOPRAMIDE HCL 5 MG/ML IJ SOLN
10.0000 mg | Freq: Once | INTRAMUSCULAR | Status: AC
  Administered 2017-06-18: 10 mg via INTRAVENOUS
  Filled 2017-06-18: qty 2

## 2017-06-18 MED ORDER — DIAZEPAM 5 MG/ML IJ SOLN
2.5000 mg | Freq: Once | INTRAMUSCULAR | Status: AC
  Administered 2017-06-18: 2.5 mg via INTRAVENOUS
  Filled 2017-06-18: qty 2

## 2017-06-18 MED ORDER — DIPHENHYDRAMINE HCL 50 MG/ML IJ SOLN
25.0000 mg | Freq: Once | INTRAMUSCULAR | Status: AC
  Administered 2017-06-18: 25 mg via INTRAVENOUS
  Filled 2017-06-18: qty 1

## 2017-06-18 NOTE — ED Notes (Signed)
Patient denies pain and is resting comfortably.  

## 2017-06-18 NOTE — ED Triage Notes (Signed)
Pt states she woke up with a headache, checked her BP and found it to be high. Pt states she has been experiencing central chest pressure x 2 hours with mild SOB. Pt took a clonidine 0.1mg  this morning that her dr instructed her to take when her BP is high.

## 2017-06-18 NOTE — ED Notes (Signed)
[facility] notified that the patient will not be admitted

## 2017-06-18 NOTE — ED Notes (Signed)
ED Provider at bedside. 

## 2017-06-18 NOTE — ED Notes (Signed)
After benadryl and reglan pt became very agitated and c/o nausea, dizziness and not feeling well. Her BP increased to 200/105 and she c/o that her head felt it could explode. Dr. Colvin Caroli notified, ordered per verbal 10mg  of labetolol and 2.5 mg of diazepam. PT refused labetalol but was willing to try diazepam at this time. After diazepam BP came down and pt is resting.

## 2017-06-18 NOTE — Significant Event (Signed)
72 year old with past medical history of hypertension coming in with severe headache and some chest pressure with systolic blood pressure in the 190s.  Patient apparently refused IV blood pressure medications and blood pressure came down on its own with treatment of hypertension.  Patient is being admitted here for evaluation of chest pain and uncontrolled hypertension.  She will need a telemetry bed.

## 2017-06-18 NOTE — ED Provider Notes (Signed)
[facility] Provider Note   CSN: [id number] Arrival date & time: 06/18/17  1035     History   Chief Complaint Chief Complaint  Patient presents with  . Chest Pain  . Hypertension    HPI Brianna Harper is a 72 y.o. female.  HPI  Patient reports that she woke up this morning and had a headache.  She reports to pressure at the back of her head and she thought her blood pressure might be elevated.  She reports she took her blood pressure and it was 190/100.  She is compliant with her blood pressure medications.  She denies any missed doses.  She reports typically her blood pressures are well controlled in the 140s over 80s, reports at that pressure she usually feels very good.  She reports occasionally she does get unexplained spikes in her blood pressure.  Her doctor has given her clonidine to take on an as-needed basis.  Reports she took 1 clonidine tablet and retook her blood pressure in about an hour.  It was still elevated and she did not feel well.  She reports she also had a pressure sensation in the middle of her chest.  Nausea, no vomiting, no focal weakness numbness or tingling.  She reports she does not take the clonidine very often and this is probably only the second or third time she is taken in this type of situation.  The patient reports that she used to have problems with migraine headaches.  She reports that migraines are not typical for her anymore and she felt that this headache felt different.  She however denies that the headache is "severe".  She reports this just a lot of pressure.  Past Medical History:  Diagnosis Date  . Anxiety    no per pt  . 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 Depression   . GERD (gastroesophageal reflux disease)   . Hyperlipidemia   . Hypertension   . Laryngitis   . Migraine    "not very often anymore; they were more menopausal" (04/16/2013)  . Osteopenia    dexa -2.0 hip 2008  . Rapid heart rate      hx of     Patient Active Problem List   Diagnosis Date Noted  . Laryngopharyngeal reflux (LPR) 06/18/2014  . Hypokalemia 06/18/2014  . Palpitations 06/18/2014  . Anxiety state 05/24/2014  . Atypical chest pain 04/22/2014  . Cough 04/09/2014  . Chest pain 03/14/2014  . Neck and shoulder pain 12/07/2013  . AC (acromioclavicular) arthritis 11/12/2013  . Acute onset of severe vertigo 09/20/2013  . Essential hypertension 09/20/2013  . Medication management 09/20/2013  . Subacromial bursitis 06/22/2013  . Shoulder pain, right 06/05/2013  . Arm pain, right 06/05/2013  . Acid reflux 05/04/2013  . Hyperglycemia 05/04/2013  . TGA (transient global amnesia) 04/25/2013  . Amnesia 04/15/2013  . Neck pain 03/09/2013  . Laryngitis from reflux of stomach acid- ? 01/01/2013  . Welcome to Medicare preventive visit 05/01/2012  . Leg cramp 05/01/2012  . Intermittent lightheadedness 04/03/2012  . Sinusitis 04/03/2012  . Visit for preventive health examination 07/18/2011  . Constipation 07/18/2011  . Hypertension 07/18/2011  . Urgency of urination 04/06/2011  . Sore throat 06/11/2010  . History of recurrent UTIs 06/11/2010  . OSTEOPENIA 05/18/2009  . Urticaria 07/10/2007  . HYPERLIPIDEMIA 10/28/2006  . ANXIETY 10/28/2006  . DEPRESSION 10/28/2006  . Migraine 10/28/2006  . GERD 10/28/2006  . PMS 10/28/2006  Past Surgical History:  Procedure Laterality Date  . COLONOSCOPY  02/03/2004   internal hemorrhoids  . UPPER GASTROINTESTINAL ENDOSCOPY  08/29/2008   gastritis, hiatal hernia     OB History   None      Home Medications    Prior to Admission medications   Medication Sig Start Date End Date Taking? Authorizing Provider  cloNIDine (CATAPRES) 0.1 MG tablet Take 0.1 mg by mouth.   Yes [provider]  dicyclomine (BENTYL) 20 MG tablet Take 1 tablet (20 mg total) by mouth every 6 (six) hours as needed for spasms. 02/16/16   Gatha Mayer, MD  diphenoxylate-atropine  (LOMOTIL) 2.5-0.025 MG tablet Take 1 tablet by mouth 4 (four) times daily as needed for diarrhea or loose stools. 01/13/16   Gatha Mayer, MD  metoprolol tartrate (LOPRESSOR) 25 MG tablet Take 1 tablet (25 mg total) by mouth 2 (two) times daily. 06/24/14   Panosh, Standley Brooking, MD  Probiotic Product ([place]) Take by mouth.    [provider]  VERAPAMIL HCL ER, CO, PO Take 240 mg by mouth.    [provider]    Family History Family History  Problem Relation Age of Onset  . Hypertension Mother   . Migraines Mother   . Arthritis Father   . Ovarian cancer Maternal Grandmother   . Seizures Neg Hx   . Esophageal cancer Neg Hx   . Colon cancer Neg Hx   . Pancreatic cancer Neg Hx   . Stomach cancer Neg Hx   . Liver disease Neg Hx   . Inflammatory bowel disease Neg Hx   . Rectal cancer Neg Hx     Social History Social History   Tobacco Use  . Smoking status: Never Smoker  . Smokeless tobacco: Never Used  Substance Use Topics  . Alcohol use: Yes    Alcohol/week: 0.0 oz    Comment: extremely rare ; only on special occasion  . Drug use: No     Allergies   Lisinopril; Erythromycin ethylsuccinate; Nitrofurantoin; and Sulfadiazine   Review of Systems Review of Systems 10 Systems reviewed and are negative for acute change except as noted in the HPI.   Physical Exam Updated Vital Signs BP 115/71   Pulse 65   Temp 97.9 F (36.6 C) (Oral)   Resp 16   Ht 5\' 6"  (1.676 m)   Wt 62.6 kg (138 lb)   SpO2 98%   BMI 22.27 kg/m   Physical Exam  Constitutional: She is oriented to person, place, and time. She appears well-developed and well-nourished. No distress.  HENT:  Head: Normocephalic and atraumatic.  Right Ear: External ear normal.  Left Ear: External ear normal.  Nose: Nose normal.  Mouth/Throat: Oropharynx is clear and moist.  Eyes: Pupils are equal, round, and reactive to light. Conjunctivae and EOM are normal.  Neck: Neck supple.    Cardiovascular: Normal rate, regular rhythm, normal heart sounds and intact distal pulses.  No murmur heard. Pulmonary/Chest: Effort normal and breath sounds normal. No respiratory distress.  Abdominal: Soft. There is no tenderness.  Musculoskeletal: Normal range of motion. She exhibits no edema or tenderness.  Neurological: She is alert and oriented to person, place, and time. No cranial nerve deficit. She exhibits normal muscle tone. Coordination normal.  Skin: Skin is warm and dry.  Psychiatric: She has a normal mood and affect.  Nursing note and vitals reviewed.    ED Treatments / Results  Labs (all labs  ordered are listed, but only abnormal results are displayed) Labs Reviewed  BASIC METABOLIC PANEL  CBC  TROPONIN I  URINALYSIS, ROUTINE W REFLEX MICROSCOPIC    EKG EKG Interpretation  Date/Time:  Saturday June 18 2017 10:43:20 EDT Ventricular Rate:  86 PR Interval:    QRS Duration: 92 QT Interval:  380 QTC Calculation: 455 R Axis:   83 Text Interpretation:  Sinus rhythm Probable left atrial enlargement Borderline right axis deviation no change from previous Confirmed by Charlesetta Shanks [phone]) on 06/18/2017 11:16:36 AM   Radiology Dg Chest 2 View  Result Date: 06/18/2017 CLINICAL DATA:  Headache, elevated blood pressure, central che 05/22/2014 st pressure for 2 hours, mild shortness of breath, history hypertension EXAM: CHEST - 2 VIEW COMPARISON:  05/22/2014 FINDINGS: Normal heart size, mediastinal contours, and pulmonary vascularity. Lungs clear. No pleural effusion or pneumothorax. Bones unremarkable. IMPRESSION: Normal exam. Electronically Signed   By: Lavonia [name] M.D.   On: 06/18/2017 11:08   Ct Head Wo Contrast  Result Date: 06/18/2017 CLINICAL DATA:  Occipital headache. EXAM: CT HEAD WITHOUT CONTRAST TECHNIQUE: Contiguous axial images were obtained from the base of the skull through the vertex without intravenous contrast. COMPARISON:  MR brain dated June 30, 2014. CT head dated September 19, 2013. FINDINGS: Brain: No evidence of acute infarction, hemorrhage, hydrocephalus, extra-axial collection or mass lesion/mass effect. Vascular: Atherosclerotic vascular calcification of the carotid siphons. No hyperdense vessel. Skull: Normal. Negative for fracture or focal lesion. Prominent arachnoid granulations in the occipital bone, unchanged. Sinuses/Orbits: No acute finding. Other: None. IMPRESSION: 1. Normal for age noncontrast head CT. Electronically Signed   By: Titus Dubin M.D.   On: 06/18/2017 11:55    Procedures Procedures (including critical care time)  Medications Ordered in ED Medications  amLODipine (NORVASC) tablet 10 mg (10 mg Oral Not Given 06/18/17 1245)  labetalol (NORMODYNE,TRANDATE) 5 MG/ML injection (  Not Given 06/18/17 1245)  metoCLOPramide (REGLAN) injection 10 mg (10 mg Intravenous Given 06/18/17 1208)  diphenhydrAMINE (BENADRYL) injection 25 mg (25 mg Intravenous Given 06/18/17 1209)  diazepam (VALIUM) injection 2.5 mg (2.5 mg Intravenous Given 06/18/17 1235)     Initial Impression / Assessment and Plan / ED Course  I have reviewed the triage vital signs and the nursing notes.  Pertinent labs & imaging results that were available during my care of the patient were reviewed by me and considered in my medical decision making (see chart for details).    Consult: Reviewed with hospitalist Dr.Purohit for admission.  Final Clinical Impressions(s) / ED Diagnoses   Final diagnoses:  Hypertensive urgency  Acute nonintractable headache, unspecified headache type  Chest pain, unspecified type   Patient presented with hypertension with systolic pressure at 782/423 and complaint of posterior pressure headache and chest discomfort.  Patient's exam was normal.  She has clear mental status, clear lungs and normal cardiac exam.  I had concern for hypertensive emergency\urgency with patient's blood pressure significantly elevated above baseline  and symptoms of chest pain and headache.  Diagnostic workup initiated with CT head and EKG and screening lab work.  While in the process of working the patient up, I had plan to administer Reglan and Benadryl for pain control.  Patient's blood pressure had actually improved prior to administration of these medications without any IV antihypertensives.  Once given the Reglan Benadryl patient became very agitated and pressure rebounded to 536 systolic.  At that time, it was unclear if this was the patient having agitation secondary to Reglan  versus spike in blood pressure endogenous causes.  She was reassured and given Valium 2.5 mg.  Patient's symptoms resolved and her blood pressure improved.  I did plan for patient admission for hypertensive urgency\emergency with monitoring and rule out for possible  ACS due to report of chest pressure with hypertension.  Patient and her husband had been advised of this plan but apparently did not understand or process this information at that time.  Once patient's admission had been arranged, she reported she felt improved and her blood pressure is now normalized.  She wished to be discharged to follow-up with her primary care doctor and discuss her ongoing blood pressure management.  I did advise the patient the reason for her admission was for rule out of MI with possible coronary ischemic disease manifesting with hypertension as well as the fact that she had significant headache when her blood pressure was elevated.  At this time, patient refuses admission and wishes to follow-up with her PCP.  Patient is normotensive.  She will continue her home medications and contact her PCP. ED Discharge Orders    None       Charlesetta Shanks, MD 06/18/17 1510

## 2017-06-21 DIAGNOSIS — R0789 Other chest pain: Secondary | ICD-10-CM | POA: Diagnosis not present

## 2017-06-21 DIAGNOSIS — I10 Essential (primary) hypertension: Secondary | ICD-10-CM | POA: Diagnosis not present

## 2017-06-21 DIAGNOSIS — Z6822 Body mass index (BMI) 22.0-22.9, adult: Secondary | ICD-10-CM | POA: Diagnosis not present

## 2017-06-21 DIAGNOSIS — R946 Abnormal results of thyroid function studies: Secondary | ICD-10-CM | POA: Diagnosis not present

## 2017-06-21 DIAGNOSIS — G4700 Insomnia, unspecified: Secondary | ICD-10-CM | POA: Diagnosis not present

## 2017-06-21 DIAGNOSIS — R51 Headache: Secondary | ICD-10-CM | POA: Diagnosis not present

## 2017-08-01 ENCOUNTER — Other Ambulatory Visit: Payer: Self-pay | Admitting: Internal Medicine

## 2017-08-01 DIAGNOSIS — Z1231 Encounter for screening mammogram for malignant neoplasm of breast: Secondary | ICD-10-CM

## 2017-08-17 DIAGNOSIS — R946 Abnormal results of thyroid function studies: Secondary | ICD-10-CM | POA: Diagnosis not present

## 2017-08-17 DIAGNOSIS — K649 Unspecified hemorrhoids: Secondary | ICD-10-CM | POA: Diagnosis not present

## 2017-08-17 DIAGNOSIS — G4709 Other insomnia: Secondary | ICD-10-CM | POA: Diagnosis not present

## 2017-08-17 DIAGNOSIS — M533 Sacrococcygeal disorders, not elsewhere classified: Secondary | ICD-10-CM | POA: Diagnosis not present

## 2017-08-17 DIAGNOSIS — Z6822 Body mass index (BMI) 22.0-22.9, adult: Secondary | ICD-10-CM | POA: Diagnosis not present

## 2017-08-17 DIAGNOSIS — I10 Essential (primary) hypertension: Secondary | ICD-10-CM | POA: Diagnosis not present

## 2017-08-30 ENCOUNTER — Telehealth: Payer: Self-pay | Admitting: Internal Medicine

## 2017-08-30 NOTE — Telephone Encounter (Signed)
Anusol HC 2.5% 30 g no refill  Use prn

## 2017-08-30 NOTE — Telephone Encounter (Signed)
Patient is asking for rx for hemorrhoids. She has tried OTC products with no relief.  She reports rectal pressure with sitting and standing.

## 2017-08-31 MED ORDER — HYDROCORTISONE 2.5 % RE CREA: TOPICAL_CREAM | RECTAL | 0 refills | Status: DC

## 2017-08-31 NOTE — Telephone Encounter (Signed)
Patient notified rx sent 

## 2017-09-01 DIAGNOSIS — H2513 Age-related nuclear cataract, bilateral: Secondary | ICD-10-CM | POA: Diagnosis not present

## 2017-09-05 ENCOUNTER — Ambulatory Visit
Admission: RE | Admit: 2017-09-05 | Discharge: 2017-09-05 | Disposition: A | Discharge status: Home / Self Care | Payer: Medicare Other | Source: Ambulatory Visit | Attending: Internal Medicine | Admitting: Internal Medicine

## 2017-09-05 DIAGNOSIS — Z1231 Encounter for screening mammogram for malignant neoplasm of breast: Secondary | ICD-10-CM

## 2017-10-05 DIAGNOSIS — R05 Cough: Secondary | ICD-10-CM | POA: Diagnosis not present

## 2017-10-05 DIAGNOSIS — Z6821 Body mass index (BMI) 21.0-21.9, adult: Secondary | ICD-10-CM | POA: Diagnosis not present

## 2017-10-05 DIAGNOSIS — J3089 Other allergic rhinitis: Secondary | ICD-10-CM | POA: Diagnosis not present

## 2017-10-05 DIAGNOSIS — K219 Gastro-esophageal reflux disease without esophagitis: Secondary | ICD-10-CM | POA: Diagnosis not present

## 2017-10-31 DIAGNOSIS — J3489 Other specified disorders of nose and nasal sinuses: Secondary | ICD-10-CM | POA: Diagnosis not present

## 2017-10-31 DIAGNOSIS — F458 Other somatoform disorders: Secondary | ICD-10-CM | POA: Diagnosis not present

## 2017-10-31 DIAGNOSIS — Z79899 Other long term (current) drug therapy: Secondary | ICD-10-CM | POA: Diagnosis not present

## 2017-10-31 DIAGNOSIS — R198 Other specified symptoms and signs involving the digestive system and abdomen: Secondary | ICD-10-CM | POA: Diagnosis not present

## 2017-10-31 DIAGNOSIS — K219 Gastro-esophageal reflux disease without esophagitis: Secondary | ICD-10-CM | POA: Diagnosis not present

## 2017-10-31 DIAGNOSIS — J384 Edema of larynx: Secondary | ICD-10-CM | POA: Diagnosis not present

## 2017-12-19 DIAGNOSIS — Z6822 Body mass index (BMI) 22.0-22.9, adult: Secondary | ICD-10-CM | POA: Diagnosis not present

## 2017-12-19 DIAGNOSIS — M545 Low back pain: Secondary | ICD-10-CM | POA: Diagnosis not present

## 2017-12-22 DIAGNOSIS — Z23 Encounter for immunization: Secondary | ICD-10-CM | POA: Diagnosis not present

## 2018-01-11 DIAGNOSIS — M549 Dorsalgia, unspecified: Secondary | ICD-10-CM | POA: Diagnosis not present

## 2018-01-11 DIAGNOSIS — M545 Low back pain: Secondary | ICD-10-CM | POA: Diagnosis not present

## 2018-01-20 DIAGNOSIS — J3089 Other allergic rhinitis: Secondary | ICD-10-CM | POA: Diagnosis not present

## 2018-01-20 DIAGNOSIS — M545 Low back pain: Secondary | ICD-10-CM | POA: Diagnosis not present

## 2018-01-20 DIAGNOSIS — K219 Gastro-esophageal reflux disease without esophagitis: Secondary | ICD-10-CM | POA: Diagnosis not present

## 2018-01-20 DIAGNOSIS — Z6822 Body mass index (BMI) 22.0-22.9, adult: Secondary | ICD-10-CM | POA: Diagnosis not present

## 2018-01-20 DIAGNOSIS — I10 Essential (primary) hypertension: Secondary | ICD-10-CM | POA: Diagnosis not present

## 2018-02-03 DIAGNOSIS — Z6822 Body mass index (BMI) 22.0-22.9, adult: Secondary | ICD-10-CM | POA: Diagnosis not present

## 2018-02-03 DIAGNOSIS — M542 Cervicalgia: Secondary | ICD-10-CM | POA: Diagnosis not present

## 2018-02-03 DIAGNOSIS — I10 Essential (primary) hypertension: Secondary | ICD-10-CM | POA: Diagnosis not present

## 2018-02-03 DIAGNOSIS — R51 Headache: Secondary | ICD-10-CM | POA: Diagnosis not present

## 2018-02-03 DIAGNOSIS — R0602 Shortness of breath: Secondary | ICD-10-CM | POA: Diagnosis not present

## 2018-02-09 DIAGNOSIS — M542 Cervicalgia: Secondary | ICD-10-CM | POA: Diagnosis not present

## 2018-02-17 ENCOUNTER — Other Ambulatory Visit (HOSPITAL_COMMUNITY): Payer: Self-pay | Admitting: Internal Medicine

## 2018-02-17 DIAGNOSIS — I10 Essential (primary) hypertension: Secondary | ICD-10-CM | POA: Diagnosis not present

## 2018-02-17 DIAGNOSIS — I6529 Occlusion and stenosis of unspecified carotid artery: Secondary | ICD-10-CM | POA: Diagnosis not present

## 2018-02-17 DIAGNOSIS — M542 Cervicalgia: Secondary | ICD-10-CM

## 2018-02-17 DIAGNOSIS — Z6822 Body mass index (BMI) 22.0-22.9, adult: Secondary | ICD-10-CM | POA: Diagnosis not present

## 2018-02-21 DIAGNOSIS — M50322 Other cervical disc degeneration at C5-C6 level: Secondary | ICD-10-CM | POA: Diagnosis not present

## 2018-03-01 ENCOUNTER — Encounter (HOSPITAL_COMMUNITY): Payer: Medicare Other

## 2018-03-01 DIAGNOSIS — M5481 Occipital neuralgia: Secondary | ICD-10-CM | POA: Diagnosis not present

## 2018-03-02 ENCOUNTER — Encounter (HOSPITAL_COMMUNITY): Payer: Self-pay

## 2018-03-02 ENCOUNTER — Emergency Department (HOSPITAL_COMMUNITY)
Admission: EM | Admit: 2018-03-02 | Discharge: 2018-03-02 | Disposition: A | Discharge status: Home / Self Care | Payer: Medicare Other | Attending: Emergency Medicine | Admitting: Emergency Medicine

## 2018-03-02 DIAGNOSIS — M5481 Occipital neuralgia: Secondary | ICD-10-CM | POA: Diagnosis not present

## 2018-03-02 DIAGNOSIS — F419 Anxiety disorder, unspecified: Secondary | ICD-10-CM | POA: Diagnosis not present

## 2018-03-02 DIAGNOSIS — Z79899 Other long term (current) drug therapy: Secondary | ICD-10-CM | POA: Insufficient documentation

## 2018-03-02 DIAGNOSIS — F411 Generalized anxiety disorder: Secondary | ICD-10-CM | POA: Diagnosis not present

## 2018-03-02 DIAGNOSIS — R202 Paresthesia of skin: Secondary | ICD-10-CM | POA: Diagnosis not present

## 2018-03-02 DIAGNOSIS — R000 Tachycardia, unspecified: Secondary | ICD-10-CM | POA: Diagnosis not present

## 2018-03-02 DIAGNOSIS — I10 Essential (primary) hypertension: Secondary | ICD-10-CM | POA: Diagnosis not present

## 2018-03-02 DIAGNOSIS — R51 Headache: Secondary | ICD-10-CM | POA: Diagnosis not present

## 2018-03-02 DIAGNOSIS — R0602 Shortness of breath: Secondary | ICD-10-CM | POA: Diagnosis not present

## 2018-03-02 DIAGNOSIS — M542 Cervicalgia: Secondary | ICD-10-CM | POA: Diagnosis not present

## 2018-03-02 MED ORDER — ACETAMINOPHEN 325 MG PO TABS
650.0000 mg | ORAL_TABLET | Freq: Once | ORAL | Status: AC
  Administered 2018-03-02: 650 mg via ORAL
  Filled 2018-03-02: qty 2

## 2018-03-02 MED ORDER — LORAZEPAM 1 MG PO TABS
0.5000 mg | ORAL_TABLET | Freq: Once | ORAL | Status: AC
  Administered 2018-03-02: 0.5 mg via ORAL
  Filled 2018-03-02: qty 1

## 2018-03-02 NOTE — ED Provider Notes (Signed)
[facility] Provider Note   CSN: [id number] Arrival date & time: 03/02/18  1444     History   Chief Complaint Chief Complaint  Patient presents with  . tingling post injection    HPI Brianna Harper is a 72 y.o. female.  HPI   She presents for onset of symptoms immediately after a cervical injection which was done today for neck pain.  She apparently received a steroid injection.  She states that she immediately felt discomfort which was painful in the middle upper part of her neck and a tingling sensation over her whole body.  She also felt short of breath.  The symptoms waxed to a severe status immediately and then within 10 minutes were almost gone.  She sat there recovering, the symptoms returned milder.  She was therefore transferred here by EMS.  She had received the injections for "neck pain."  Since arrival, about 10 minutes before I saw her the symptoms have almost completely abated.  She denies headache, nausea, vomiting, fever, chills, weakness or dizziness.  She has had injections like this in the past but none in the last 4 years.  She ate breakfast this morning but did not eat lunch.  She also took her usual medicine this morning.  Her PCP has been trying to moderate her blood pressure by adding a diuretic, about 3 weeks ago.  She felt it was making her uncomfortable, so 2 days ago stopped taking it.  There are no other known modifying factors.  Past Medical History:  Diagnosis Date  . Anxiety    no per pt  . 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 Depression   . GERD (gastroesophageal reflux disease)   . Hyperlipidemia   . Hypertension   . Laryngitis   . Migraine    "not very often anymore; they were more menopausal" (04/16/2013)  . Osteopenia    dexa -2.0 hip 2008  . Rapid heart rate    hx of     Patient Active Problem List   Diagnosis Date Noted  . Laryngopharyngeal reflux (LPR) 06/18/2014  . Hypokalemia  06/18/2014  . Palpitations 06/18/2014  . Anxiety state 05/24/2014  . Atypical chest pain 04/22/2014  . Cough 04/09/2014  . Chest pain 03/14/2014  . Neck and shoulder pain 12/07/2013  . AC (acromioclavicular) arthritis 11/12/2013  . Acute onset of severe vertigo 09/20/2013  . Essential hypertension 09/20/2013  . Medication management 09/20/2013  . Subacromial bursitis 06/22/2013  . Shoulder pain, right 06/05/2013  . Arm pain, right 06/05/2013  . Acid reflux 05/04/2013  . Hyperglycemia 05/04/2013  . TGA (transient global amnesia) 04/25/2013  . Amnesia 04/15/2013  . Neck pain 03/09/2013  . Laryngitis from reflux of stomach acid- ? 01/01/2013  . Welcome to Medicare preventive visit 05/01/2012  . Leg cramp 05/01/2012  . Intermittent lightheadedness 04/03/2012  . Sinusitis 04/03/2012  . Visit for preventive health examination 07/18/2011  . Constipation 07/18/2011  . Hypertension 07/18/2011  . Urgency of urination 04/06/2011  . Sore throat 06/11/2010  . History of recurrent UTIs 06/11/2010  . OSTEOPENIA 05/18/2009  . Urticaria 07/10/2007  . HYPERLIPIDEMIA 10/28/2006  . ANXIETY 10/28/2006  . DEPRESSION 10/28/2006  . Migraine 10/28/2006  . GERD 10/28/2006  . PMS 10/28/2006    Past Surgical History:  Procedure Laterality Date  . COLONOSCOPY  02/03/2004   internal hemorrhoids  . UPPER GASTROINTESTINAL ENDOSCOPY  08/29/2008   gastritis, hiatal hernia  OB History   None      Home Medications    Prior to Admission medications   Medication Sig Start Date End Date Taking? Authorizing Provider  cloNIDine (CATAPRES) 0.1 MG tablet Take 0.1 mg by mouth.    [provider]  dicyclomine (BENTYL) 20 MG tablet Take 1 tablet (20 mg total) by mouth every 6 (six) hours as needed for spasms. 02/16/16   Gatha Mayer, MD  diphenoxylate-atropine (LOMOTIL) 2.5-0.025 MG tablet Take 1 tablet by mouth 4 (four) times daily as needed for diarrhea or loose stools. 01/13/16    Gatha Mayer, MD  hydrocortisone (ANUSOL-HC) 2.5 % rectal cream Apply to rectum as needed for hemorrhoids 08/31/17   Gatha Mayer, MD  metoprolol tartrate (LOPRESSOR) 25 MG tablet Take 1 tablet (25 mg total) by mouth 2 (two) times daily. 06/24/14   Panosh, Standley Brooking, MD  Probiotic Product ([place]) Take by mouth.    [provider]  VERAPAMIL HCL ER, CO, PO Take 240 mg by mouth.    [provider]    Family History Family History  Problem Relation Age of Onset  . Hypertension Mother   . Migraines Mother   . Arthritis Father   . Ovarian cancer Maternal Grandmother   . Seizures Neg Hx   . Esophageal cancer Neg Hx   . Colon cancer Neg Hx   . Pancreatic cancer Neg Hx   . Stomach cancer Neg Hx   . Liver disease Neg Hx   . Inflammatory bowel disease Neg Hx   . Rectal cancer Neg Hx   . Breast cancer Neg Hx     Social History Social History   Tobacco Use  . Smoking status: Never Smoker  . Smokeless tobacco: Never Used  Substance Use Topics  . Alcohol use: Yes    Alcohol/week: 0.0 standard drinks    Comment: extremely rare ; only on special occasion  . Drug use: No     Allergies   Lisinopril; Erythromycin ethylsuccinate; Nitrofurantoin; and Sulfadiazine   Review of Systems Review of Systems  All other systems reviewed and are negative.    Physical Exam Updated Vital Signs BP (!) 153/76 (BP Location: Right Arm)   Pulse 73   Temp 98.2 F (36.8 C) (Oral)   Resp 16   Ht 5\' 6"  (1.676 m)   Wt 61.7 kg   SpO2 100%   BMI 21.95 kg/m   Physical Exam  Constitutional: She is oriented to person, place, and time. She appears well-developed and well-nourished. No distress.  HENT:  Head: Normocephalic and atraumatic.  Eyes: Pupils are equal, round, and reactive to light. Conjunctivae and EOM are normal.  Neck: Normal range of motion and phonation normal. Neck supple.  Cardiovascular: Normal rate and regular rhythm.  Pulmonary/Chest:  Effort normal and breath sounds normal. No respiratory distress. She exhibits no tenderness.  Abdominal: Soft. She exhibits no distension. There is no tenderness. There is no guarding.  Musculoskeletal: Normal range of motion.  Neurological: She is alert and oriented to person, place, and time. She exhibits normal muscle tone.  No dysarthria, aphasia or nystagmus.  Normal sensation hands and feet bilaterally.  Skin: Skin is warm and dry.  Psychiatric: She has a normal mood and affect. Her behavior is normal. Judgment and thought content normal.  Nursing note and vitals reviewed.    ED Treatments / Results  Labs (all labs ordered are listed, but only abnormal results are displayed) Labs  Reviewed - No data to display  EKG EKG Interpretation  Date/Time:  Thursday March 02 2018 14:51:53 EST Ventricular Rate:  80 PR Interval:    QRS Duration: 93 QT Interval:  399 QTC Calculation: 461 R Axis:   79 Text Interpretation:  Sinus rhythm Probable left atrial enlargement since last tracing no significant change Confirmed by Daleen Bo [phone]) on 03/02/2018 3:34:37 PM   Radiology No results found.  Procedures Procedures (including critical care time)  Medications Ordered in ED Medications  acetaminophen (TYLENOL) tablet 650 mg (650 mg Oral Given 03/02/18 1619)  LORazepam (ATIVAN) tablet 0.5 mg (0.5 mg Oral Given 03/02/18 1619)     Initial Impression / Assessment and Plan / ED Course  I have reviewed the triage vital signs and the nursing notes.  Pertinent labs & imaging results that were available during my care of the patient were reviewed by me and considered in my medical decision making (see chart for details).  Clinical Course as of Mar 02 1744  Thu Mar 02, 2018  1547 Patient now feels like she is having a recurrence of her symptoms which she currently describes as a headache, and some increased generalized tingling.  Her husband is now here in the room.  I offered her  Tylenol and something to help her relax.  She agrees with that.  She will continue to be observed for period of time.   [EW]    Clinical Course User Index [EW] Daleen Bo, MD     Patient Vitals for the past 24 hrs:  BP Temp Temp src Pulse Resp SpO2 Height Weight  03/02/18 1731 (!) 153/76 - - 73 16 100 % - -  03/02/18 1500 (!) 192/87 - - 71 10 100 % - -  03/02/18 1452 (!) 212/86 - - 79 13 100 % 5\' 6"  (1.676 m) 61.7 kg  03/02/18 1451 - 98.2 F (36.8 C) Oral - - - - -  03/02/18 1449 - - - - - 100 % - -    5:45 PM Reevaluation with update and discussion. After initial assessment and treatment, an updated evaluation reveals she is comfortable.  She has no further complaints.  Findings discussed with patient and husband, all questions were answered. Daleen Bo   Medical Decision Making: Nonspecific neck discomfort post occipital injection.  No evidence for allergic reaction.  Suspect nonspecific anxiety.  Blood pressure improved with treatment in the ED.  No indication for further ED treatment or hospitalization at this time.  CRITICAL CARE-no Performed by: Daleen Bo  Nursing Notes Reviewed/ Care Coordinated Applicable Imaging Reviewed Interpretation of Laboratory Data incorporated into ED treatment  The patient appears reasonably screened and/or stabilized for discharge and I doubt any other medical condition or other [facility] requiring further screening, evaluation, or treatment in the ED at this time prior to discharge.  Plan: Home Medications-continue usual medicines; Home Treatments-heat to affected area; return here if the recommended treatment, does not improve the symptoms; Recommended follow up-PCP 1 week for checkup since stopped diuretic, will need to have blood pressure checked again.   Final Clinical Impressions(s) / ED Diagnoses   Final diagnoses:  Anxiety state  Hypertension, unspecified type    ED Discharge Orders    None       Daleen Bo,  MD 03/02/18 1746

## 2018-03-02 NOTE — ED Triage Notes (Signed)
Pt BIB [facility] from [facility] post TPI. Pt started experiencing SOB, tinglingness to BUE, Pt was Hypertensive at SBP 200s post inj. 230/110 on EMS arrival 100% RA placed on 2L/o2 for comfort

## 2018-03-02 NOTE — ED Notes (Signed)
Patient verbalizes understanding of discharge instructions. Opportunity for questioning and answers were provided. Armband removed by staff, pt discharged from ED.  

## 2018-03-02 NOTE — Discharge Instructions (Addendum)
Your symptoms will likely improve with time.  Make sure that you are taking your prescribed medication.  For the discomfort in your neck, I recommend heat 2 or 3 times a day.  Since Dr. Maryjean Ka mentioned ice you could also try that.  Sometimes it is helpful to alternate ice and heat to areas of discomfort.  Call Dr. Maryjean Ka and let them know how the visit here, went today.

## 2018-03-02 NOTE — ED Notes (Signed)
Got patient undress on the monitor patient is resting with nurse at bedside and call bell in reach 

## 2018-03-07 DIAGNOSIS — M542 Cervicalgia: Secondary | ICD-10-CM | POA: Diagnosis not present

## 2018-03-07 DIAGNOSIS — M62838 Other muscle spasm: Secondary | ICD-10-CM | POA: Diagnosis not present

## 2018-03-08 DIAGNOSIS — M542 Cervicalgia: Secondary | ICD-10-CM | POA: Diagnosis not present

## 2018-03-08 DIAGNOSIS — M62838 Other muscle spasm: Secondary | ICD-10-CM | POA: Diagnosis not present

## 2018-03-09 ENCOUNTER — Other Ambulatory Visit (HOSPITAL_COMMUNITY): Payer: Self-pay | Admitting: Internal Medicine

## 2018-03-09 DIAGNOSIS — I6529 Occlusion and stenosis of unspecified carotid artery: Secondary | ICD-10-CM

## 2018-03-09 DIAGNOSIS — M62838 Other muscle spasm: Secondary | ICD-10-CM | POA: Diagnosis not present

## 2018-03-09 DIAGNOSIS — M542 Cervicalgia: Secondary | ICD-10-CM | POA: Diagnosis not present

## 2018-03-10 ENCOUNTER — Ambulatory Visit (HOSPITAL_COMMUNITY)
Admission: RE | Admit: 2018-03-10 | Discharge: 2018-03-10 | Disposition: A | Discharge status: Home / Self Care | Payer: Medicare Other | Source: Ambulatory Visit | Attending: Internal Medicine | Admitting: Internal Medicine

## 2018-03-10 DIAGNOSIS — I6529 Occlusion and stenosis of unspecified carotid artery: Secondary | ICD-10-CM | POA: Insufficient documentation

## 2018-03-13 DIAGNOSIS — M542 Cervicalgia: Secondary | ICD-10-CM | POA: Diagnosis not present

## 2018-03-13 DIAGNOSIS — M62838 Other muscle spasm: Secondary | ICD-10-CM | POA: Diagnosis not present

## 2018-03-15 DIAGNOSIS — M62838 Other muscle spasm: Secondary | ICD-10-CM | POA: Diagnosis not present

## 2018-03-15 DIAGNOSIS — M542 Cervicalgia: Secondary | ICD-10-CM | POA: Diagnosis not present

## 2018-03-27 DIAGNOSIS — M542 Cervicalgia: Secondary | ICD-10-CM | POA: Diagnosis not present

## 2018-03-27 DIAGNOSIS — M62838 Other muscle spasm: Secondary | ICD-10-CM | POA: Diagnosis not present

## 2018-03-30 DIAGNOSIS — M62838 Other muscle spasm: Secondary | ICD-10-CM | POA: Diagnosis not present

## 2018-03-30 DIAGNOSIS — M542 Cervicalgia: Secondary | ICD-10-CM | POA: Diagnosis not present

## 2018-04-03 DIAGNOSIS — M542 Cervicalgia: Secondary | ICD-10-CM | POA: Diagnosis not present

## 2018-04-03 DIAGNOSIS — M62838 Other muscle spasm: Secondary | ICD-10-CM | POA: Diagnosis not present

## 2018-04-05 ENCOUNTER — Ambulatory Visit (HOSPITAL_COMMUNITY)
Admission: RE | Admit: 2018-04-05 | Discharge: 2018-04-05 | Disposition: A | Discharge status: Home / Self Care | Payer: Medicare Other | Source: Ambulatory Visit | Attending: Internal Medicine | Admitting: Internal Medicine

## 2018-04-05 ENCOUNTER — Other Ambulatory Visit: Payer: Self-pay | Admitting: Internal Medicine

## 2018-04-05 ENCOUNTER — Other Ambulatory Visit (HOSPITAL_COMMUNITY): Payer: Self-pay | Admitting: Internal Medicine

## 2018-04-05 DIAGNOSIS — R51 Headache: Secondary | ICD-10-CM

## 2018-04-05 DIAGNOSIS — R200 Anesthesia of skin: Secondary | ICD-10-CM

## 2018-04-05 DIAGNOSIS — R202 Paresthesia of skin: Secondary | ICD-10-CM

## 2018-04-05 DIAGNOSIS — G510 Bell's palsy: Secondary | ICD-10-CM | POA: Diagnosis not present

## 2018-04-05 DIAGNOSIS — I63039 Cerebral infarction due to thrombosis of unspecified carotid artery: Secondary | ICD-10-CM

## 2018-04-05 DIAGNOSIS — L400 Psoriasis vulgaris: Secondary | ICD-10-CM | POA: Diagnosis not present

## 2018-04-05 DIAGNOSIS — G44001 Cluster headache syndrome, unspecified, intractable: Secondary | ICD-10-CM

## 2018-04-05 DIAGNOSIS — I10 Essential (primary) hypertension: Secondary | ICD-10-CM | POA: Diagnosis not present

## 2018-04-05 DIAGNOSIS — D225 Melanocytic nevi of trunk: Secondary | ICD-10-CM | POA: Diagnosis not present

## 2018-04-05 DIAGNOSIS — I788 Other diseases of capillaries: Secondary | ICD-10-CM | POA: Diagnosis not present

## 2018-04-05 DIAGNOSIS — L814 Other melanin hyperpigmentation: Secondary | ICD-10-CM | POA: Diagnosis not present

## 2018-04-05 DIAGNOSIS — R209 Unspecified disturbances of skin sensation: Secondary | ICD-10-CM

## 2018-04-05 DIAGNOSIS — D692 Other nonthrombocytopenic purpura: Secondary | ICD-10-CM | POA: Diagnosis not present

## 2018-04-05 DIAGNOSIS — G932 Benign intracranial hypertension: Secondary | ICD-10-CM | POA: Insufficient documentation

## 2018-04-05 DIAGNOSIS — Z6821 Body mass index (BMI) 21.0-21.9, adult: Secondary | ICD-10-CM | POA: Diagnosis not present

## 2018-04-05 DIAGNOSIS — D1801 Hemangioma of skin and subcutaneous tissue: Secondary | ICD-10-CM | POA: Diagnosis not present

## 2018-04-05 DIAGNOSIS — M542 Cervicalgia: Secondary | ICD-10-CM | POA: Diagnosis not present

## 2018-04-05 DIAGNOSIS — L821 Other seborrheic keratosis: Secondary | ICD-10-CM | POA: Diagnosis not present

## 2018-04-05 DIAGNOSIS — R519 Headache, unspecified: Secondary | ICD-10-CM

## 2018-04-05 MED ORDER — GADOBUTROL 1 MMOL/ML IV SOLN
6.0000 mL | Freq: Once | INTRAVENOUS | Status: AC | PRN
  Administered 2018-04-05: 6 mL via INTRAVENOUS

## 2018-04-05 NOTE — Progress Notes (Signed)
Pt refused [facility] brain due to insurance pending.

## 2018-04-06 LAB — POCT I-STAT CREATININE: Creatinine, Ser: 1.2 mg/dL — ABNORMAL HIGH (ref 0.44–1.00)

## 2018-04-07 ENCOUNTER — Telehealth: Payer: Self-pay | Admitting: Neurology

## 2018-04-07 NOTE — Telephone Encounter (Signed)
OK for the switch.

## 2018-04-07 NOTE — Telephone Encounter (Signed)
We received a new referral on pt for her headaches. She saw Dr. Jannifer Franklin once in 2015, but is requestiong to see Dr. Jaynee Eagles for this. Are you both ok with this?

## 2018-04-07 NOTE — Telephone Encounter (Signed)
That's fine with me thanks 

## 2018-04-19 ENCOUNTER — Ambulatory Visit (INDEPENDENT_AMBULATORY_CARE_PROVIDER_SITE_OTHER): Payer: Medicare Other | Admitting: Neurology

## 2018-04-19 ENCOUNTER — Encounter: Payer: Self-pay | Admitting: Neurology

## 2018-04-19 VITALS — BP 184/98 | HR 74 | Ht 66.0 in | Wt 140.0 lb

## 2018-04-19 DIAGNOSIS — I63039 Cerebral infarction due to thrombosis of unspecified carotid artery: Secondary | ICD-10-CM

## 2018-04-19 DIAGNOSIS — G4489 Other headache syndrome: Secondary | ICD-10-CM

## 2018-04-19 MED ORDER — NORTRIPTYLINE HCL 10 MG PO CAPS: ORAL_CAPSULE | ORAL | 3 refills | Status: DC

## 2018-04-19 MED ORDER — PREDNISONE 10 MG PO TABS: ORAL_TABLET | ORAL | 0 refills | Status: DC

## 2018-04-19 NOTE — Progress Notes (Signed)
Reason for visit: Headache  Referring physician: Dr. Tressie Harper is a 73 y.o. female  History of present illness:  Brianna Harper is a 73 year old right-handed white female who was seen here in 2015 for an event of transient global amnesia.  The patient has a pre-existing history of migraine headaches but she has not had any migraine in a number of years.  She comes in today with a new headache problem that began around 27 January 2018.  The patient was getting ready to go out, and she noted onset of some discomfort in the base of the neck bilaterally.  Over time, this has gradually spread to go down the neck and into the shoulders bilaterally with some tingling sensations in the upper arms bilaterally.  The pain is also spread up into the head and then around to the front of the head and behind the eyes, the patient has a sensation of a balloon inside the head expanding.  She does not have any significant neck stiffness but she does have some slight discomfort with turning her head.  She does have some crepitus in the neck.  She reports no significant nausea or vomiting, she denies any significant vision changes.  She has not had any numbness or weakness of the face, arms, legs other than what has been mentioned.  She denies any balance changes or difficulty controlling the bowels or the bladder.  About 6 or 7 days prior to the onset of the headache, her verapamil dose was increased from 240 mg to a 300 mg daily dose.  The patient does have wide variations in her blood pressure from time to time, she does not correlate her headaches to her blood pressure issues.  The headaches are daily, better in the morning, worse as the day goes on.  The patient has undergone physical therapy without benefit.  She was given a trial on prednisone for 6 days which did help, but the headache came back after the prednisone was stopped.  She claims that she did have a sedimentation rate checked and that this  was unremarkable.  MRI of the brain is been done and MRI of the cervical spine has been done, the studies do not show an etiology for her headaches.  The MRI of the brain was done with and without gadolinium enhancement.  The patient has been placed on diazepam 2.5 mg twice daily, she has been slurring her speech some on this medication.  This medication has not been beneficial.  She was given a trial for 2 weeks on Mobic, this has helped her neck pain some but not her headaches.  She comes to the office today for further evaluation.  Past Medical History:  Diagnosis Date  . Anxiety    no per pt  . 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 Depression   . GERD (gastroesophageal reflux disease)   . Hyperlipidemia   . Hypertension   . Laryngitis   . Migraine    "not very often anymore; they were more menopausal" (04/16/2013)  . Osteopenia    dexa -2.0 hip 2008  . Rapid heart rate    hx of     Past Surgical History:  Procedure Laterality Date  . COLONOSCOPY  02/03/2004   internal hemorrhoids  . UPPER GASTROINTESTINAL ENDOSCOPY  08/29/2008   gastritis, hiatal hernia    Family History  Problem Relation Age of Onset  . Hypertension Mother   .  Migraines Mother   . Arthritis Father   . Ovarian cancer Maternal Grandmother   . Seizures Neg Hx   . Esophageal cancer Neg Hx   . Colon cancer Neg Hx   . Pancreatic cancer Neg Hx   . Stomach cancer Neg Hx   . Liver disease Neg Hx   . Inflammatory bowel disease Neg Hx   . Rectal cancer Neg Hx   . Breast cancer Neg Hx     Social history:  reports that she has never smoked. She has never used smokeless tobacco. She reports current alcohol use. She reports that she does not use drugs.  Medications:  Prior to Admission medications   Medication Sig Start Date End Date Taking? Authorizing Provider  cloNIDine (CATAPRES) 0.1 MG tablet Take 0.1 mg by mouth. PRN   Yes [provider]  diazepam (VALIUM) 5 MG tablet TAKE 1 2 (ONE HALF)  TABLET BY MOUTH TWICE DAILY 04/10/18  Yes [provider]  meloxicam (MOBIC) 15 MG tablet TAKE 1 TABLET BY MOUTH ONCE DAILY WITH FOOD 04/05/18  Yes [provider]  metoprolol tartrate (LOPRESSOR) 25 MG tablet Take 1 tablet (25 mg total) by mouth 2 (two) times daily. 06/24/14  Yes Panosh, Standley Brooking, MD  spironolactone (ALDACTONE) 25 MG tablet Take 25 mg by mouth daily. 1/2 tab daily 03/06/18  Yes [provider]  VERAPAMIL HCL ER, CO, PO Take 300 mg by mouth.    Yes [provider]      Allergies  Allergen Reactions  . Lisinopril Cough  . Erythromycin Ethylsuccinate Other (See Comments)    REACTION: GI upset  . Nitrofurantoin Hives and Swelling    macrobid   . Lidocaine     Unable to breath   . Sulfadiazine Itching and Rash    ROS:  Out of a complete 14 system review of symptoms, the patient complains only of the following symptoms, and all other reviewed systems are negative.  Decreased appetite, fatigue Facial swelling, difficulty swallowing Eye pain Shortness of breath Frequent waking Joint pain, aching muscles, neck pain Headache, numbness  Blood pressure (!) 184/98, pulse 74, height 5\' 6"  (1.676 m), weight 140 lb (63.5 kg), SpO2 98 %.  Physical Exam  General: The patient is alert and cooperative at the time of the examination.  Eyes: Pupils are equal, round, and reactive to light. Discs are flat bilaterally.  Neck: The neck is supple, no carotid bruits are noted.  Respiratory: The respiratory examination is clear.  Cardiovascular: The cardiovascular examination reveals a regular rate and rhythm, no obvious murmurs or rubs are noted.  Neuromuscular: Range move the cervical spine is relatively full, she may lack about 10 degrees of full lateral rotation bilaterally.  No crepitus is noted in the temporomandibular joints.  Skin: Extremities are without significant edema.  Neurologic Exam  Mental status: The patient is alert and  oriented x 3 at the time of the examination. The patient has apparent normal recent and remote memory, with an apparently normal attention span and concentration ability.  Cranial nerves: Facial symmetry is present. There is good sensation of the face to pinprick and soft touch bilaterally. The strength of the facial muscles and the muscles to head turning and shoulder shrug are normal bilaterally. Speech is well enunciated, no aphasia or dysarthria is noted. Extraocular movements are full. Visual fields are full. The tongue is midline, and the patient has symmetric elevation of the soft palate. No obvious hearing deficits are noted.  Motor: The motor testing reveals 5 over 5 strength of all 4 extremities. Good symmetric motor tone is noted throughout.  Sensory: Sensory testing is intact to pinprick, soft touch, vibration sensation, and position sense on all 4 extremities. No evidence of extinction is noted.  Coordination: Cerebellar testing reveals good finger-nose-finger and heel-to-shin bilaterally.  Gait and station: Gait is normal. Tandem gait is normal. Romberg is negative. No drift is seen.  Reflexes: Deep tendon reflexes are symmetric and normal bilaterally. Toes are downgoing bilaterally.   MRI brain 04/05/18:  IMPRESSION: No acute intracranial process or abnormal enhancement identified. Stable unremarkable MRI of the brain for age.  * MRI scan images were reviewed online. I agree with the written report.    Assessment/Plan:  1.  Chronic daily headache, tension headache  The headaches are most consistent with a tension headache, the patient does have a history of migraine.  The etiology of the tension headache is not clear.  The headaches began shortly after increased dosing of the verapamil, may consider reducing the dose back to 240 mg for least 6 weeks to see if this improves the headache.  In the meantime, the patient will be given another trial on prednisone for 12 days, she  will be placed on nortriptyline working up to 30 mg at night.  Blood work will be done today.  She will follow-up in 3 months.  If the headaches do not improve, we may consider a lumbar puncture.  A carotid Doppler study has also been done and has been unremarkable.  Jill Alexanders MD 04/19/2018 11:55 AM  [facility] [address] [place], [place] [address]  Phone [phone] Fax [phone]

## 2018-04-19 NOTE — Patient Instructions (Signed)
We will go on prednisone dosepack and start nortriptyline for the headache.   Pamelor (nortriptyline) is an antidepressant medication that has many uses that may include headache, whiplash injuries, or for peripheral neuropathy pain. Side effects may include drowsiness, dry mouth, blurred vision, or constipation. As with any antidepressant medication, worsening depression may occur. If you had any significant side effects, please call our office. The full effects of this medication may take 7-10 days after starting the drug, or going up on the dose.

## 2018-04-20 LAB — CBC WITH DIFFERENTIAL/PLATELET
BASOS ABS: 0.1 10*3/uL (ref 0.0–0.2)
Basos: 1 %
EOS (ABSOLUTE): 0.1 10*3/uL (ref 0.0–0.4)
Eos: 1 %
Hematocrit: 41.7 % (ref 34.0–46.6)
Hemoglobin: 13.9 g/dL (ref 11.1–15.9)
IMMATURE GRANULOCYTES: 0 %
Immature Grans (Abs): 0 10*3/uL (ref 0.0–0.1)
Lymphocytes Absolute: 1.3 10*3/uL (ref 0.7–3.1)
Lymphs: 18 %
MCH: 31.2 pg (ref 26.6–33.0)
MCHC: 33.3 g/dL (ref 31.5–35.7)
MCV: 94 fL (ref 79–97)
Monocytes Absolute: 0.6 10*3/uL (ref 0.1–0.9)
Monocytes: 8 %
Neutrophils Absolute: 5 10*3/uL (ref 1.4–7.0)
Neutrophils: 72 %
Platelets: 273 10*3/uL (ref 150–450)
RBC: 4.46 x10E6/uL (ref 3.77–5.28)
RDW: 13.4 % (ref 11.7–15.4)
WBC: 7 10*3/uL (ref 3.4–10.8)

## 2018-04-20 LAB — COMPREHENSIVE METABOLIC PANEL
ALT: 9 IU/L (ref 0–32)
AST: 14 IU/L (ref 0–40)
Albumin/Globulin Ratio: 1.8 (ref 1.2–2.2)
Albumin: 4.4 g/dL (ref 3.7–4.7)
Alkaline Phosphatase: 56 IU/L (ref 39–117)
BUN/Creatinine Ratio: 19 (ref 12–28)
BUN: 17 mg/dL (ref 8–27)
Bilirubin Total: 0.5 mg/dL (ref 0.0–1.2)
CO2: 25 mmol/L (ref 20–29)
Calcium: 10.1 mg/dL (ref 8.7–10.3)
Chloride: 104 mmol/L (ref 96–106)
Creatinine, Ser: 0.91 mg/dL (ref 0.57–1.00)
GFR calc Af Amer: 73 mL/min/{1.73_m2} (ref >59)
GFR calc non Af Amer: 64 mL/min/{1.73_m2} (ref >59)
Globulin, Total: 2.4 g/dL (ref 1.5–4.5)
Glucose: 91 mg/dL (ref 65–99)
Potassium: 5.5 mmol/L — ABNORMAL HIGH (ref 3.5–5.2)
Sodium: 144 mmol/L (ref 134–144)
Total Protein: 6.8 g/dL (ref 6.0–8.5)

## 2018-04-20 LAB — RHEUMATOID FACTOR

## 2018-04-20 LAB — B. BURGDORFI ANTIBODIES: Lyme IgG/IgM Ab: <0.91 {ISR} (ref 0.00–0.90)

## 2018-04-20 LAB — ANA W/REFLEX: Anti Nuclear Antibody(ANA): NEGATIVE

## 2018-04-20 LAB — ANGIOTENSIN CONVERTING ENZYME: ANGIO CONVERT ENZYME: 49 U/L (ref 14–82)

## 2018-04-20 LAB — SEDIMENTATION RATE: SED RATE: 2 mm/h (ref 0–40)

## 2018-04-20 LAB — C-REACTIVE PROTEIN: CRP: 2 mg/L (ref 0–10)

## 2018-04-21 ENCOUNTER — Telehealth: Payer: Self-pay | Admitting: Neurology

## 2018-04-21 NOTE — Telephone Encounter (Signed)
I called the patient.  The patient actually got a bit of a headache on the 60 mg prednisone dose, I would reduce to 50 mg on the next dose today, if the headache continues I will stop the prednisone altogether.  The patient will slowly ramp up on the nortriptyline, she will call for any dose adjustments of this medication.  I discussed the blood work results with her, everything appeared to be unremarkable exception of the mild elevation of the potassium which may be associated with spironolactone or with the blood draw method.

## 2018-04-21 NOTE — Telephone Encounter (Addendum)
Pt has called to inform that she started the predniSONE (DELTASONE) 10 MG tablet on yesterday and around 4 pm until 1am  She had a headache on both sides of her head and across her eyes.  Pt now dizzy, feeling week and tingling on both arms.  Pt said she has taken 3 pills today and wants to know if Dr Jannifer Franklin wants her to start her step down process today instead of tomorrow with this predniSONE (DELTASONE) 10 MG tablet Pt is also asking for RN to clarify how she should be taking the nortriptyline (PAMELOR) 10 MG capsule and what to look out for before increasing dose per Dr Jannifer Franklin

## 2018-04-24 ENCOUNTER — Telehealth: Payer: Self-pay | Admitting: Neurology

## 2018-04-24 NOTE — Telephone Encounter (Signed)
Pt states she is having an extreme reaction to her nortriptyline (PAMELOR) 10 MG capsule and her predniSONE (DELTASONE) 10 MG tablet Pt states her BP is elevating with the medications. It was 188/101 last night. Pt is having excruciating headaches. Please advise.

## 2018-04-24 NOTE — Telephone Encounter (Signed)
I contacted the the pt. Pt stated over the weekend BP has been high and headache has been present. Pt states she has been up all night and believes the nortriptyline is not working for her. Pt states at 9:00 this morning her bp was 159/84( pt took her verapamil/metoprolol and an extra clonidine this am) Pt states she has not taken her prednisone this am states yesterday she took a total of 30 mg. Pt was advised to hold her prednisone until MD reviews message. Pt was agreeable.  Pt did want me to notate she has taken prednisone in the past w/o complication and states again she believes the bp changes are r/t to nortriptyline.  I advised I would send to Dr. Jannifer Franklin to further advise, pt was agreeable.

## 2018-04-24 NOTE — Telephone Encounter (Signed)
I called the patient.  The patient had a significant blood pressure elevation on the prednisone, she will stop the drug, this has created increased headaches in the evening.  She will stay on nortriptyline, if the headaches do not improve towards the end of the week, we may have to stop this and try another medication.

## 2018-04-24 NOTE — Addendum Note (Signed)
Addended by: Kathrynn Ducking on: 04/24/2018 04:13 PM   Modules accepted: Orders

## 2018-04-28 ENCOUNTER — Telehealth: Payer: Self-pay | Admitting: Neurology

## 2018-04-28 MED ORDER — GABAPENTIN 100 MG PO CAPS: ORAL_CAPSULE | ORAL | 1 refills | Status: DC

## 2018-04-28 NOTE — Telephone Encounter (Signed)
Pt states since she has been on the nortriptyline (PAMELOR) 10 MG capsule her BP has stabalized but she is experiencing nightmares, night sweats and wakes up with a  Headache.She has not bumped up to 20mg  as of yet. She has discontinued the Prednisone since last Sunday but she would like to know if she should continue the nortriptyline (PAMELOR) 10 MG capsule. Please advise.

## 2018-04-28 NOTE — Telephone Encounter (Signed)
I called the patient.  The patient is having night sweats and nightmares on the nortriptyline, she is only on 10 mg dose, she will stop the medication, I will start her on low-dose gabapentin.  The patient has stopped the diazepam, she clearly is still having slow deliberate speech, slight dysarthria.  If no improvement in the near future, we may consider lumbar puncture.

## 2018-05-08 ENCOUNTER — Telehealth: Payer: Self-pay | Admitting: Neurology

## 2018-05-08 DIAGNOSIS — G4489 Other headache syndrome: Secondary | ICD-10-CM

## 2018-05-08 NOTE — Telephone Encounter (Signed)
I called the patient.  The patient is still having ongoing headaches that have spread into the retro-orbital area and temporal regions.  The patient is concerned about temporal arteritis, but the C-reactive protein and sedimentation rates were on the low end of normal, she did not respond to a second course of prednisone, these issues would go against the diagnosis.  The fact that the patient is having slurring of speech with a unrelenting headache is concerning, lumbar puncture will be done.  The patient is on a very low-dose of gabapentin, she is to go to 200 mg 3 times daily.  I will go ahead and set up a lumbar puncture for her.

## 2018-05-08 NOTE — Telephone Encounter (Signed)
Pt states that she is on gabapentin (NEURONTIN) 100 MG capsule and she seems to not be getting any better.She states she is still suffering neck pain, eye pain, tender scalp, shoulder pain and her headaches used to come on from the back of her head now they seem to be more on the sides of her head and across her forehead. She has been on 3 capsules for the last four days. Her pharmacist recommended to slowly increase the dosage. Please advise.

## 2018-05-15 ENCOUNTER — Telehealth: Payer: Self-pay | Admitting: Neurology

## 2018-05-15 DIAGNOSIS — R471 Dysarthria and anarthria: Secondary | ICD-10-CM

## 2018-05-15 DIAGNOSIS — G4489 Other headache syndrome: Secondary | ICD-10-CM

## 2018-05-15 NOTE — Telephone Encounter (Signed)
Pt called wanting to know if her SP Lumbar can be done at [facility] or [facility] instead of GI. She sates she "just prefer's to got to the hospital". Please advise.

## 2018-05-15 NOTE — Telephone Encounter (Signed)
I called the patient and gave her the number to [facility] scheduling at [phone]. She is going to wait until the new order is put in and call tomorrow to schedule. She stated that she would also call GI to cancel.

## 2018-05-15 NOTE — Telephone Encounter (Signed)
I called the patient to discuss this but she did not answer so I left a message with her husband to call us back.

## 2018-05-15 NOTE — Telephone Encounter (Signed)
I left a vmail with Tiffany her phone number is [phone] to give me a call back about scheduling the patients LP at [facility].

## 2018-05-15 NOTE — Telephone Encounter (Signed)
I spoke with central scheduling for [facility] and [facility] who stated their soonest available apt would be   They stated that they doctor would need to put in a new order because it was esigned to GI. I will wait to hear back from the patient but please put in a new order. Once a new order is placed, patient can call [phone] to schedule. DW

## 2018-05-15 NOTE — Telephone Encounter (Signed)
I have reordered the lumbar puncture for [facility].

## 2018-05-18 ENCOUNTER — Other Ambulatory Visit: Payer: Self-pay | Admitting: Student

## 2018-05-22 ENCOUNTER — Telehealth: Payer: Self-pay | Admitting: *Deleted

## 2018-05-22 ENCOUNTER — Other Ambulatory Visit: Payer: Medicare Other

## 2018-05-22 NOTE — Telephone Encounter (Signed)
Requested a call back to further discuss LP questions.

## 2018-05-22 NOTE — Telephone Encounter (Signed)
Patient stopped by the office.  Wants a call back to discuss her upcoming LP.  Wants to know what Dr.  Jannifer Franklin is looking for,  Please call

## 2018-05-22 NOTE — Telephone Encounter (Signed)
I contacted the patient. She wanted to verify the reason for the LP. I advised the patient per Dr. Jannifer Franklin note on 05/08/18 the LP was recommended due to slurring of speech and a unrelenting headache. Patient verbalized understanding on this recommendation but wanted MD to know since starting the gabapentin her headaches have greatly improved and feels like this issue is resolved. Patient also wanted me to advise MD since starting the gabapentin she has had a flare up of Lichen Planus. Patient has a f/u with her dentist tomorrow to further discuss this issue.  Patient would like for MD to confirm if LP is still recommended since headaches have greatly improved. I advised I would fwd to MD to further advise. LP is scheduled for 05/24/18 at [facility].

## 2018-05-22 NOTE — Telephone Encounter (Signed)
I called the patient.  The patient has gained excellent benefit with her headaches on gabapentin.  However, the patient has had recurrence of lichen planus on the medication, this apparently is reported with gabapentin but comprises only 0.01% of individuals on the medication, it is not clear whether this is causal or just an association with an overlap of patient populations.  The patient will be seeing her oral surgeon, if the problem is severe, we may stop the gabapentin to see if the lichen planus improves.  She did have lichen planus years ago that was very mild.  If the headaches have markedly improved, the patient may delay the lumbar puncture, we will hold off on this study for now.  She will call to cancel the procedure.

## 2018-05-24 ENCOUNTER — Ambulatory Visit (HOSPITAL_COMMUNITY): Admission: RE | Admit: 2018-05-24 | Payer: Medicare Other | Source: Ambulatory Visit

## 2018-05-29 ENCOUNTER — Telehealth: Payer: Self-pay | Admitting: Neurology

## 2018-05-29 NOTE — Telephone Encounter (Signed)
Dr. Jannifer Franklin and patient made a decision to not have LP because patient was feeling better. Thanks Hinton Dyer

## 2018-06-02 ENCOUNTER — Telehealth: Payer: Self-pay | Admitting: Neurology

## 2018-06-02 MED ORDER — VENLAFAXINE HCL ER 37.5 MG PO CP24: ORAL_CAPSULE | ORAL | 2 refills | Status: DC

## 2018-06-02 NOTE — Addendum Note (Signed)
Addended by: Kathrynn Ducking on: 06/02/2018 01:12 PM   Modules accepted: Orders

## 2018-06-02 NOTE — Telephone Encounter (Signed)
I called pt. Pt reports that since starting her gabapentin it has worked Oceanographer for her headaches but has caused an "explosion" of oral lichen planus. She has seen her dentist who is treating her lichen planus as best as they can but it doesn't seem as if it is going away. Pt is wondering if Dr. Jannifer Franklin can prescribe another medication for her headaches.

## 2018-06-02 NOTE — Telephone Encounter (Signed)
Pt has called to inform she is having problems with the gabapentin (NEURONTIN) 100 MG capsule.  Pt states Dr Jannifer Franklin told here there would be a possibility of her medication needing to be changed from the gabapentin (NEURONTIN) 100 MG capsule, please call

## 2018-06-02 NOTE — Telephone Encounter (Signed)
I called the patient.  The patient indicates that she needs come off of the gabapentin secondary to the lichen planus.  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  The patient will be given a trial on Effexor for the headache, she will taper off the gabapentin by going to 100 mg 3 times daily for 2 weeks and then stop the medication.  The patient will go up on the Effexor from 37.5 mg to 75 mg after 1 week.

## 2018-06-19 ENCOUNTER — Telehealth: Payer: Self-pay

## 2018-06-19 NOTE — Telephone Encounter (Signed)
I called the patient.  The patient indicates that she has tolerated Lexapro previously, but this medication is not usually used for headache.  The patient is off of the gabapentin, her headaches have not returned, I would not recommend starting any medication at this time.  If the headache returns, then she is to start the Effexor.

## 2018-06-19 NOTE — Telephone Encounter (Signed)
Received a message from phone room staff requesting I call the pt. I called the pt and was able to discuss with her. She wanted to advise Dr. Jannifer Franklin she completed the gabapentin weaning process today. Pt states before she starts Effexor if Lexapro would be an alternative she would prefer to take Lexapro over the Effexor. Patient claims she has been on Lexapro in the past and tolerated it well.

## 2018-07-17 ENCOUNTER — Telehealth: Payer: Self-pay | Admitting: Neurology

## 2018-07-17 NOTE — Telephone Encounter (Signed)
Pt's email is [email or web address] Pt understands that the [employer] webex software must be downloaded and operational on the device pt plans to use for the visit.

## 2018-07-17 NOTE — Telephone Encounter (Signed)
Pt called back and consented to a Virtual Visit instead of the Tele Visit. Email has been confirmed and consent has been given for insurance to be billed as such.

## 2018-07-17 NOTE — Telephone Encounter (Signed)
Due to current COVID 19 pandemic, our office is severely reducing in office visits for at least the next 2 weeks, in order to minimize the risk to our patients and healthcare providers. Pt understands that although there may be some limitations with this type of visit, we will take all precautions to reduce any security or privacy concerns.  Pt understands that this will be treated like an in office visit and we will file with pt's insurance, and there may be a patient responsible charge related to this service. Best contact # for pt [phone]. Pt did not feel comfortable doing a virtual visit.

## 2018-07-18 ENCOUNTER — Telehealth: Payer: Self-pay

## 2018-07-18 NOTE — Telephone Encounter (Signed)
I called and left a message for patient asking that she call us back so that we can update her chart before her virtual visit tomorrow.

## 2018-07-19 ENCOUNTER — Ambulatory Visit (INDEPENDENT_AMBULATORY_CARE_PROVIDER_SITE_OTHER): Payer: Medicare Other | Admitting: Neurology

## 2018-07-19 ENCOUNTER — Other Ambulatory Visit: Payer: Self-pay

## 2018-07-19 ENCOUNTER — Encounter: Payer: Self-pay | Admitting: Neurology

## 2018-07-19 DIAGNOSIS — I63039 Cerebral infarction due to thrombosis of unspecified carotid artery: Secondary | ICD-10-CM

## 2018-07-19 DIAGNOSIS — R51 Headache: Secondary | ICD-10-CM | POA: Diagnosis not present

## 2018-07-19 DIAGNOSIS — G4486 Cervicogenic headache: Secondary | ICD-10-CM

## 2018-07-19 HISTORY — DX: Cervicogenic headache: G44.86

## 2018-07-19 NOTE — Telephone Encounter (Signed)
I returned the pt's call and was able to update meds, allergies and PMH for video visit.

## 2018-07-19 NOTE — Progress Notes (Signed)
     Virtual Visit via Video Note  I connected with Brianna Harper on 07/19/18 at 11:00 AM EDT by a video enabled telemedicine application and verified that I am speaking with the correct person using two identifiers.   I discussed the limitations of evaluation and management by telemedicine and the availability of in person appointments. The patient expressed understanding and agreed to proceed.  History of Present Illness: Brianna Harper is a 73 year old right-handed white female with a history of cervicogenic headache.  The patient is had neck discomfort with pain in the back of the head.  She went on gabapentin and gained excellent benefit with the drug but this resulted in an increase in her lichen planus.  The patient went off the gabapentin and fortunately her headaches did not worsen, she continues to have some pressure sensation in the back of the head and some neck pain and stiffness.  The tenderness is most significant at the insertion point of the cervical paraspinal muscles with the base of the skull.  The patient does have some neck stiffness.  She has had improvement in her lichen planus off of gabapentin.  She has developed a dull achy pain in the gums bilaterally in the maxillary and mandibular region, she has been to see and periodontist, no abnormalities with the gums were noted.  She had a root canal done.  She reports that if she chews for long periods of time this may increase some of the discomfort.  She does not have jabbing pain or any neuralgia type pain.  She was given a prescription for Effexor, because her headaches had improved she did not go on the medication.   Observations/Objective: WebEx evaluation feels that the patient is alert and cooperative.  Speech is well enunciated, not aphasic or dysarthric.  Extraocular movements are full.  The patient is able to protrude the tongue in the midline with good lateral movement of the tongue.  The patient has good  finger-nose-finger and heel shin bilaterally.  Gait is normal.  Tandem gait normal.  Romberg is negative.  No drift is seen.  The patient has lost about 10 or 15 degrees of full lateral rotation of the cervical spine bilaterally.  Assessment and Plan: 1.  Cervicogenic headache, improved  2.  Bilateral gum discomfort, achiness  The patient will hold off on any medical therapy at this point.  If the headache pain comes back, she will go on the Effexor, we could potentially try Lyrica in the future.  The patient will follow-up through this office in about 4 months.  She is to do gentle stretching exercises with the neck regularly.  Follow Up Instructions: Follow-up with me in 4 months.   I discussed the assessment and treatment plan with the patient. The patient was provided an opportunity to ask questions and all were answered. The patient agreed with the plan and demonstrated an understanding of the instructions.   The patient was advised to call back or seek an in-person evaluation if the symptoms worsen or if the condition fails to improve as anticipated.  I provided 25 minutes of non-face-to-face time during this encounter.   Kathrynn Ducking, MD

## 2018-07-19 NOTE — Addendum Note (Signed)
Addended by: Verlin Grills T on: 07/19/2018 10:51 AM   Modules accepted: Orders

## 2018-07-19 NOTE — Telephone Encounter (Signed)
Pt returned call , in the midst of transferring the call dropped

## 2018-07-20 ENCOUNTER — Telehealth: Payer: Self-pay | Admitting: Neurology

## 2018-07-20 MED ORDER — PREGABALIN 25 MG PO CAPS: 25.0000 mg | ORAL_CAPSULE | Freq: Two times a day (BID) | ORAL | 1 refills | Status: DC

## 2018-07-20 NOTE — Telephone Encounter (Signed)
I called the patient.  The patient has had an increase in neck pain and headache today, we will place the patient on Lyrica taking 25 mg twice daily, she will call for any dose adjustments.

## 2018-07-20 NOTE — Telephone Encounter (Signed)
Pt has called to inform that after visit with Dr Jannifer Franklin on yesterday she has had a change of mind and would now like for Dr Jannifer Franklin to move forward with medication for her pain.  Pt is asking for a call to discuss

## 2018-07-20 NOTE — Addendum Note (Signed)
Addended by: Kathrynn Ducking on: 07/20/2018 02:59 PM   Modules accepted: Orders

## 2018-08-01 ENCOUNTER — Other Ambulatory Visit: Payer: Self-pay | Admitting: Internal Medicine

## 2018-08-01 DIAGNOSIS — Z1231 Encounter for screening mammogram for malignant neoplasm of breast: Secondary | ICD-10-CM

## 2018-08-04 ENCOUNTER — Telehealth: Payer: Self-pay | Admitting: Neurology

## 2018-08-04 ENCOUNTER — Other Ambulatory Visit (HOSPITAL_COMMUNITY): Payer: Self-pay | Admitting: Neurology

## 2018-08-04 MED ORDER — TOPIRAMATE 25 MG PO TABS: 25.0000 mg | ORAL_TABLET | Freq: Two times a day (BID) | ORAL | 2 refills | Status: DC

## 2018-08-04 NOTE — Telephone Encounter (Signed)
Pt states that she is having swollen gums and bleeding of the gums for the past week and a half on her pregabalin (LYRICA) 25 MG capsule. Pt states she was on  Gabapentin prior to being prescribed Lyrica and she states she had the same symptoms on that as well. Please advise.

## 2018-08-04 NOTE — Telephone Encounter (Signed)
I initially called pt but she did not pick up as she was driving. I spoke to her husband who asked pt to call me back later which she did. She is having neck and psoterior head pain and feels neither gabapentin or lyrica helped and infact may have made her gums inflamed and painful. I advised her to stop lyrica and instead try Topamax 25 mg at night x 3 days and then twice daily. I discussed possible SEs and answered questions.She was asked to call back on Monday the office to see if she needed further changes. She voiced understanding

## 2018-08-06 NOTE — Telephone Encounter (Signed)
The patient has been taken off of the Lyrica, I will list gabapentin and Lyrica as allergies.

## 2018-08-18 ENCOUNTER — Telehealth: Payer: Self-pay | Admitting: Neurology

## 2018-08-18 NOTE — Telephone Encounter (Signed)
08-18-18 pain in base of skull & jaw pain , extensive dental work done in last 3 weeks Pt has agreed to a doxy.me vv she gave verbal consent to file insurance for the doxy.me vv Email confirmed as:[email or web address]  Pt understands that although there may be some limitations with this type of visit, we will take all precautions to reduce any security or privacy concerns.  Pt understands that this will be treated like an in office visit and we will file with pt's insurance, and there may be a patient responsible charge related to this service. *email has been sent*

## 2018-08-18 NOTE — Telephone Encounter (Signed)
Pt has called asking for  A a call from Dr Jannifer Franklin to discuss reoccurng pain in base of her skull.  Pt also has more jaw pain.  Pt has had extensive dental work.  Pt has accepted a doxy.me vv appointment but she would very much like to speak with Dr Jannifer Franklin before the doxy.me vv.  Pt was made aware the office is not open on Fridays and that due to holiday the office will not reopen until Tuesday. Please call

## 2018-08-22 MED ORDER — DULOXETINE HCL 20 MG PO CPEP: ORAL_CAPSULE | ORAL | 2 refills | Status: DC

## 2018-08-22 NOTE — Telephone Encounter (Signed)
I called the patient. I left a message, I will call back later. 

## 2018-08-22 NOTE — Addendum Note (Signed)
Addended by: Kathrynn Ducking on: 08/22/2018 05:58 PM   Modules accepted: Orders

## 2018-08-22 NOTE — Telephone Encounter (Signed)
I called the patient.  The patient is now having dental and gum related discomfort, she has been to see a dentist and periodontist, she has had a root canal done without benefit.  There has been some question about swelling of the gums, but the periodontist does not believe that this is significant.  It was initially felt to be due to the Lyrica but she has been off the medication for 3 weeks without benefit, she was only on the medication for 2 weeks.  The patient still has pain in the back of the head and base of the skull.  We will start Cymbalta 20 mg daily for 2 weeks and then go to then go to 20 mg twice daily.

## 2018-08-22 NOTE — Telephone Encounter (Signed)
Noted  

## 2018-08-22 NOTE — Telephone Encounter (Signed)
Pt notified Dr. Jannifer Franklin will return to the office on 08/23/18 and he would review message then. Pt was agreeable.

## 2018-08-24 ENCOUNTER — Telehealth: Payer: Self-pay

## 2018-08-24 MED ORDER — TIZANIDINE HCL 2 MG PO TABS: 2.0000 mg | ORAL_TABLET | Freq: Two times a day (BID) | ORAL | 1 refills | Status: DC

## 2018-08-24 NOTE — Addendum Note (Signed)
Addended by: Kathrynn Ducking on: 08/24/2018 05:06 PM   Modules accepted: Orders

## 2018-08-24 NOTE — Telephone Encounter (Signed)
I called the patient.  The Cymbalta resulted in increased headache and nausea.  The patient has stopped the drug.  I will try a very low-dose of tizanidine 2 mg twice daily, if the patient cannot tolerate this we may try baclofen.

## 2018-08-24 NOTE — Telephone Encounter (Signed)
Patient called in to report an adverse reaction to Cymbalta 20 mg. She states last night at 1830  she took 1 tablet of  Cymbalta and woke at midnight with a terrible headache with associated nausea and sweating. Patient reports the headache has decreased to a dull like pain. Patient has been treating with Tylenol as directed.   Patient was advised she continue Tylenol and I would send a message to Dr. Jannifer Franklin to review and advise.  Pt was agreeable and confirmed the best call back # is [phone].

## 2018-08-28 DIAGNOSIS — I10 Essential (primary) hypertension: Secondary | ICD-10-CM | POA: Diagnosis not present

## 2018-08-28 DIAGNOSIS — R002 Palpitations: Secondary | ICD-10-CM | POA: Diagnosis not present

## 2018-08-28 DIAGNOSIS — R51 Headache: Secondary | ICD-10-CM | POA: Diagnosis not present

## 2018-08-28 DIAGNOSIS — F419 Anxiety disorder, unspecified: Secondary | ICD-10-CM | POA: Diagnosis not present

## 2018-08-28 DIAGNOSIS — R5383 Other fatigue: Secondary | ICD-10-CM | POA: Diagnosis not present

## 2018-08-30 ENCOUNTER — Telehealth: Payer: Self-pay | Admitting: Neurology

## 2018-08-30 NOTE — Telephone Encounter (Signed)
Pt called stating that the tiZANidine (ZANAFLEX) 2 MG tablet is starting to drop her BP. She took it today and it was 113/66 at noon then she took it just now and it was 115/72. All day she has been really tiered and not wanting to get out of bed. Please advise.

## 2018-08-30 NOTE — Telephone Encounter (Signed)
I called the patient.  The patient feels somewhat fatigued on the Zanaflex, her blood pressure normally runs in the 145 range and now is around 115.  We will cut back on the tizanidine taking 2 mg in the evening for a week and then try to go to 2 mg twice daily.  She does not believe it is helping her headache.  In the past, she has had what sounds like an occipital nerve block which resulted in extreme hypertension requiring EMS evaluation.  She has not had an epidural steroid injection in the past.    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

## 2018-09-04 ENCOUNTER — Telehealth: Payer: Self-pay | Admitting: Neurology

## 2018-09-04 MED ORDER — BACLOFEN 10 MG PO TABS: ORAL_TABLET | ORAL | 1 refills | Status: DC

## 2018-09-04 NOTE — Telephone Encounter (Signed)
I called the patient.  The patient had dizziness and syncope on tizanidine, I did lower her blood pressure.  She has stopped the drug, we will try baclofen at 5 mg at night for a week then go to 5 mg twice daily.  We talked about doing an epidural steroid injection of the neck, but the patient does not like  imaging and does not want to have the procedure done.

## 2018-09-04 NOTE — Telephone Encounter (Signed)
Pt has called stating that over the weekend she called and spoke with the On Call Dr (Dr Krista Blue). Pt states she was told to call today to speak with Dr Jannifer Franklin re: her taking tizanidine.  Pt states she passed out Saturday morning from dizziness.  Pt has not been able to eat due to jaw pain.  Pt states she has not been able to sleep due to neck pain for weeks.  Please call

## 2018-09-04 NOTE — Addendum Note (Signed)
Addended by: Kathrynn Ducking on: 09/04/2018 09:09 AM   Modules accepted: Orders

## 2018-09-06 NOTE — Telephone Encounter (Signed)
Error

## 2018-09-07 ENCOUNTER — Telehealth: Payer: Self-pay | Admitting: Neurology

## 2018-09-07 DIAGNOSIS — G4486 Cervicogenic headache: Secondary | ICD-10-CM

## 2018-09-07 NOTE — Addendum Note (Signed)
Addended by: Kathrynn Ducking on: 09/07/2018 01:29 PM   Modules accepted: Orders

## 2018-09-07 NOTE — Telephone Encounter (Signed)
Pt has called to inform that she was not able to start baclofen due to her having nausea .  Pt states she is now having sever neck and teeth pain.  Pt is asking for a call to discuss what her options are.

## 2018-09-07 NOTE — Telephone Encounter (Signed)
I called the patient.  The patient has not yet taken the baclofen, she has felt nauseated, according to her husband she has lost 10 pounds in the last several weeks because of her dental pain that has started, she initially had pain in the lower jaw associated with a single tooth, she is trying to decide whether or not to have the tooth pulled.  However, the pain is now generalized to the maxillary area as well.  She wonders whether the jaw pain may be related to the neck.  The neck pain is quite severe and she has associated neck stiffness.  I will go ahead and set up a referral for second opinion regarding her headache pain through Dr. Quentin Angst at [facility].     She has had MRI of the cervical spine done on the 21 February 2018.  IMPRESSION:   Mild cervical degenerative disc disease at C5-6 and C6-7. No significant central canal stenosis. Foraminal spurring with mild left neural foraminal narrowing at C5-6 and moderate left and mild right at C6-7.

## 2018-09-11 ENCOUNTER — Telehealth: Payer: Self-pay | Admitting: Neurology

## 2018-09-11 NOTE — Telephone Encounter (Signed)
Pt called wanting to know what is the update on the referral for the headache doctor she was to be referred to at [facility]. Please advise.

## 2018-09-12 ENCOUNTER — Telehealth: Payer: Self-pay | Admitting: Neurology

## 2018-09-12 NOTE — Telephone Encounter (Signed)
Patient called and wanted to make sure her referral was sent. Referral has been sent and I gave her telephone number as well to Dr. Theda Sers.

## 2018-09-18 DIAGNOSIS — R51 Headache: Secondary | ICD-10-CM | POA: Diagnosis not present

## 2018-09-27 ENCOUNTER — Ambulatory Visit
Admission: RE | Admit: 2018-09-27 | Discharge: 2018-09-27 | Disposition: A | Discharge status: Home / Self Care | Payer: Medicare Other | Source: Ambulatory Visit | Attending: Internal Medicine | Admitting: Internal Medicine

## 2018-09-27 ENCOUNTER — Other Ambulatory Visit: Payer: Self-pay

## 2018-09-27 DIAGNOSIS — Z1231 Encounter for screening mammogram for malignant neoplasm of breast: Secondary | ICD-10-CM | POA: Diagnosis not present

## 2018-09-30 ENCOUNTER — Encounter (HOSPITAL_COMMUNITY): Payer: Self-pay

## 2018-09-30 ENCOUNTER — Other Ambulatory Visit: Payer: Self-pay

## 2018-09-30 ENCOUNTER — Ambulatory Visit (HOSPITAL_COMMUNITY)
Admission: EM | Admit: 2018-09-30 | Discharge: 2018-09-30 | Disposition: A | Discharge status: Home / Self Care | Payer: Medicare Other | Attending: Family Medicine | Admitting: Family Medicine

## 2018-09-30 DIAGNOSIS — R109 Unspecified abdominal pain: Secondary | ICD-10-CM

## 2018-09-30 DIAGNOSIS — R110 Nausea: Secondary | ICD-10-CM

## 2018-09-30 LAB — COMPREHENSIVE METABOLIC PANEL
ALT: 15 U/L (ref 0–44)
AST: 20 U/L (ref 15–41)
Albumin: 4.5 g/dL (ref 3.5–5.0)
Alkaline Phosphatase: 40 U/L (ref 38–126)
Anion gap: 10 (ref 5–15)
BUN: 16 mg/dL (ref 8–23)
CO2: 24 mmol/L (ref 22–32)
Calcium: 9.5 mg/dL (ref 8.9–10.3)
Chloride: 100 mmol/L (ref 98–111)
Creatinine, Ser: 1.05 mg/dL — ABNORMAL HIGH (ref 0.44–1.00)
GFR calc Af Amer: >60 mL/min (ref >60)
GFR calc non Af Amer: 53 mL/min — ABNORMAL LOW (ref >60)
Glucose, Bld: 112 mg/dL — ABNORMAL HIGH (ref 70–99)
Potassium: 4 mmol/L (ref 3.5–5.1)
Sodium: 134 mmol/L — ABNORMAL LOW (ref 135–145)
Total Bilirubin: 1.5 mg/dL — ABNORMAL HIGH (ref 0.3–1.2)
Total Protein: 6.7 g/dL (ref 6.5–8.1)

## 2018-09-30 LAB — CBC WITH DIFFERENTIAL/PLATELET
Abs Immature Granulocytes: 0.01 10*3/uL (ref 0.00–0.07)
Basophils Absolute: 0.1 10*3/uL (ref 0.0–0.1)
Basophils Relative: 1 %
Eosinophils Absolute: 0.1 10*3/uL (ref 0.0–0.5)
Eosinophils Relative: 2 %
HCT: 41.7 % (ref 36.0–46.0)
Hemoglobin: 14.1 g/dL (ref 12.0–15.0)
Immature Granulocytes: 0 %
Lymphocytes Relative: 22 %
Lymphs Abs: 1.4 10*3/uL (ref 0.7–4.0)
MCH: 31.7 pg (ref 26.0–34.0)
MCHC: 33.8 g/dL (ref 30.0–36.0)
MCV: 93.7 fL (ref 80.0–100.0)
Monocytes Absolute: 0.5 10*3/uL (ref 0.1–1.0)
Monocytes Relative: 8 %
Neutro Abs: 4.2 10*3/uL (ref 1.7–7.7)
Neutrophils Relative %: 67 %
Platelets: 248 10*3/uL (ref 150–400)
RBC: 4.45 MIL/uL (ref 3.87–5.11)
RDW: 12.9 % (ref 11.5–15.5)
WBC: 6.2 10*3/uL (ref 4.0–10.5)
nRBC: 0 % (ref 0.0–0.2)

## 2018-09-30 LAB — POCT URINALYSIS DIP (DEVICE)
Bilirubin Urine: NEGATIVE
Glucose, UA: NEGATIVE mg/dL
Hgb urine dipstick: NEGATIVE
Ketones, ur: NEGATIVE mg/dL
Leukocytes,Ua: NEGATIVE
Nitrite: NEGATIVE
Protein, ur: NEGATIVE mg/dL
Specific Gravity, Urine: 1.02 (ref 1.005–1.030)
Urobilinogen, UA: 0.2 mg/dL (ref 0.0–1.0)
pH: 5.5 (ref 5.0–8.0)

## 2018-09-30 LAB — LIPASE, BLOOD: Lipase: 39 U/L (ref 11–51)

## 2018-09-30 LAB — AMYLASE: Amylase: 44 U/L (ref 28–100)

## 2018-09-30 MED ORDER — ONDANSETRON HCL 4 MG PO TABS: 4.0000 mg | ORAL_TABLET | Freq: Four times a day (QID) | ORAL | 0 refills | Status: DC

## 2018-09-30 MED ORDER — ONDANSETRON 4 MG PO TBDP
4.0000 mg | ORAL_TABLET | Freq: Once | ORAL | Status: AC
  Administered 2018-09-30: 17:00:00 4 mg via ORAL

## 2018-09-30 MED ORDER — ONDANSETRON 4 MG PO TBDP
ORAL_TABLET | ORAL | Status: AC
  Filled 2018-09-30: qty 1

## 2018-09-30 NOTE — Discharge Instructions (Addendum)
We will call you in regards to your lab results as soon as they are available. In the interim, should you develop worsening pain, nausea or develop vomiting, fever, chest pain, difficulty breathing, blood in urine, go to the ER for further evaluation.  May take Zofran under your tongue as needed for additional nausea relief. It is very important to follow-up with your GI doctor on Monday.

## 2018-09-30 NOTE — ED Provider Notes (Signed)
[place]    CSN: [id number] Arrival date & time: 09/30/18  1605     History   Chief Complaint Chief Complaint  Patient presents with   Abdominal Pain   Nausea    HPI JUNIPER COBEY is a 73 y.o. female with history of hypertension, GERD presenting for acute concern of nausea and abdominal pain.  Patient states that she has had intermittent prandial nausea for the last 2 months.  Patient states that this is worse after shortly after eating; denies vomiting, increased belching, reflux.  Patient states that her symptoms have worsened acutely over the last few days.  Patient feels nauseous most of the time, has not been able to eat much other than clear liquids, Jell-O, "a little bit of chicken broth ".  Patient called her GI doctor earlier today, who recommended that she take 2 doses of her PPI.  Patient states that she has not been on this medication for a few weeks and feels this may be contributory.  Patient took 2 doses of Prilosec this morning as instructed, though has not noticed significant relief.  She denies history of gastric ulcer, duodenal ulcer, gastritis.  Patient has also tried Tums without relief.  Patient infrequently consumes alcohol, denies history of pancreatitis.  Patient denies abdominal surgeries.  No urinary or fecal issues.  Last BM was yesterday without blood or melena.  Patient does admit to having slightly increased amount of flatus over the last month.  Patient has been afebrile, without myalgias or arthralgias.  Recent illness, or sick contacts.  No recent travel.  Of note, patient reports 6 pound weight loss over the last 2 months second to nausea/decreased appetite.   Past Medical History:  Diagnosis Date   Anxiety    no per pt   Cancer ([place])    squamous cell- left leg   Cervicogenic headache 07/19/2018   Depression    GERD (gastroesophageal reflux disease)    Hyperlipidemia    Hypertension    Laryngitis    Migraine    "not very  often anymore; they were more menopausal" (04/16/2013)   Osteopenia    dexa -2.0 hip 2008   Rapid heart rate    hx of     Patient Active Problem List   Diagnosis Date Noted   Cervicogenic headache 07/19/2018   Laryngopharyngeal reflux (LPR) 06/18/2014   Hypokalemia 06/18/2014   Palpitations 06/18/2014   Anxiety state 05/24/2014   Atypical chest pain 04/22/2014   Cough 04/09/2014   Chest pain 03/14/2014   Neck and shoulder pain 12/07/2013   AC (acromioclavicular) arthritis 11/12/2013   Acute onset of severe vertigo 09/20/2013   Essential hypertension 09/20/2013   Medication management 09/20/2013   Subacromial bursitis 06/22/2013   Shoulder pain, right 06/05/2013   Arm pain, right 06/05/2013   Acid reflux 05/04/2013   Hyperglycemia 05/04/2013   TGA (transient global amnesia) 04/25/2013   Amnesia 04/15/2013   Neck pain 03/09/2013   Laryngitis from reflux of stomach acid- ? 01/01/2013   Welcome to Medicare preventive visit 05/01/2012   Leg cramp 05/01/2012   Intermittent lightheadedness 04/03/2012   Sinusitis 04/03/2012   Visit for preventive health examination 07/18/2011   Constipation 07/18/2011   Hypertension 07/18/2011   Urgency of urination 04/06/2011   Sore throat 06/11/2010   History of recurrent UTIs 06/11/2010   OSTEOPENIA 05/18/2009   Urticaria 07/10/2007   HYPERLIPIDEMIA 10/28/2006   ANXIETY 10/28/2006   DEPRESSION 10/28/2006   Migraine 10/28/2006   GERD  10/28/2006   PMS 10/28/2006    Past Surgical History:  Procedure Laterality Date   COLONOSCOPY  02/03/2004   internal hemorrhoids   UPPER GASTROINTESTINAL ENDOSCOPY  08/29/2008   gastritis, hiatal hernia    OB History   No obstetric history on file.      Home Medications    Prior to Admission medications   Medication Sig Start Date End Date Taking? Authorizing Provider  baclofen (LIORESAL) 10 MG tablet 1/2 tablet at night for 1 week, then take 1/2  tablet twice daily 09/04/18   Kathrynn Ducking, MD  cloNIDine (CATAPRES) 0.1 MG tablet Take 0.1 mg by mouth. PRN    [provider]  metoprolol tartrate (LOPRESSOR) 25 MG tablet Take 1 tablet (25 mg total) by mouth 2 (two) times daily. 06/24/14   Panosh, Standley Brooking, MD  Multiple Vitamin (MULTIVITAMIN) capsule Take 1 capsule by mouth daily.    [provider]  ondansetron (ZOFRAN) 4 MG tablet Take 1 tablet (4 mg total) by mouth every 6 (six) hours. 09/30/18   Hall-Potvin, [place], PA-C  spironolactone (ALDACTONE) 25 MG tablet Take 25 mg by mouth daily. 1/2 tab daily 03/06/18   [provider]  VERAPAMIL HCL ER, CO, PO Take 300 mg by mouth.     [provider]    Family History Family History  Problem Relation Age of Onset   Hypertension Mother    Migraines Mother    Arthritis Father    Ovarian cancer Maternal Grandmother    Seizures Neg Hx    Esophageal cancer Neg Hx    Colon cancer Neg Hx    Pancreatic cancer Neg Hx    Stomach cancer Neg Hx    Liver disease Neg Hx    Inflammatory bowel disease Neg Hx    Rectal cancer Neg Hx    Breast cancer Neg Hx     Social History Social History   Tobacco Use   Smoking status: Never Smoker   Smokeless tobacco: Never Used  Substance Use Topics   Alcohol use: Yes    Alcohol/week: 0.0 standard drinks    Comment: extremely rare ; only on special occasion   Drug use: No     Allergies   Lisinopril, Erythromycin ethylsuccinate, Nitrofurantoin, Cymbalta [duloxetine hcl], Gabapentin, Lidocaine, Lyrica [pregabalin], Tizanidine, and Sulfadiazine   Review of Systems As per HPI   Physical Exam Triage Vital Signs ED Triage Vitals [09/30/18 1623]  Enc Vitals Group     BP (!) 166/82     Pulse Rate 78     Resp 17     Temp 98 F (36.7 C)     Temp Source Oral     SpO2 98 %     Weight      Height      Head Circumference      Peak Flow      Pain Score      Pain Loc      Pain Edu?      Excl.  in [place]?    No data found.  Updated Vital Signs BP (!) 166/82 (BP Location: Left Arm)    Pulse 78    Temp 98 F (36.7 C) (Oral)    Resp 17    SpO2 98%   Visual Acuity Right Eye Distance:   Left Eye Distance:   Bilateral Distance:    Right Eye Near:   Left Eye Near:    Bilateral Near:     Physical Exam Constitutional:  General: She is not in acute distress.    Appearance: She is well-developed. She is not ill-appearing.     Comments: Lean build  HENT:     Head: Normocephalic and atraumatic.     Mouth/Throat:     Mouth: Mucous membranes are moist.     Pharynx: Oropharynx is clear. No pharyngeal swelling or oropharyngeal exudate.  Eyes:     General: No scleral icterus.    Extraocular Movements: Extraocular movements intact.     Pupils: Pupils are equal, round, and reactive to light.  Cardiovascular:     Rate and Rhythm: Normal rate and regular rhythm.     Comments: DP pulses intact bilaterally, symmetric. Pulmonary:     Effort: Pulmonary effort is normal. No respiratory distress.     Breath sounds: Normal breath sounds. No wheezing or rales.  Abdominal:     General: Abdomen is flat. Bowel sounds are normal. There is no distension or abdominal bruit. There are no signs of injury.     Palpations: Abdomen is soft. There is no hepatomegaly, splenomegaly or mass.     Tenderness: There is abdominal tenderness in the epigastric area. There is no right CVA tenderness, left CVA tenderness, guarding or rebound. Negative signs include Murphy's sign, Rovsing's sign and McBurney's sign.     Hernia: No hernia is present.     Comments: Able to appreciate abdominal aorta.  Diameter approximately 2.5 cm.  Nontender to palpation.  Skin:    General: Skin is warm.     Capillary Refill: Capillary refill takes less than 2 seconds.     Coloration: Skin is not cyanotic, jaundiced, mottled or pale.  Neurological:     General: No focal deficit present.     Mental Status: She is alert and  oriented to person, place, and time.  Psychiatric:        Mood and Affect: Mood is not anxious.        Behavior: Behavior normal.      UC Treatments / Results  Labs (all labs ordered are listed, but only abnormal results are displayed) Labs Reviewed  COMPREHENSIVE METABOLIC PANEL - Abnormal; Notable for the following components:      Result Value   Sodium 134 (*)    Glucose, Bld 112 (*)    Creatinine, Ser 1.05 (*)    Total Bilirubin 1.5 (*)    GFR calc non Af Amer 53 (*)    All other components within normal limits  CBC WITH DIFFERENTIAL/PLATELET  LIPASE, BLOOD  AMYLASE  POCT URINALYSIS DIP (DEVICE)    EKG   Radiology No results found.  Procedures Procedures (including critical care time)  Medications Ordered in UC Medications  ondansetron (ZOFRAN-ODT) disintegrating tablet 4 mg (4 mg Oral Given 09/30/18 1723)  ondansetron (ZOFRAN-ODT) 4 MG disintegrating tablet (has no administration in time range)    Initial Impression / Assessment and Plan / UC Course  I have reviewed the triage vital signs and the nursing notes.  Pertinent labs & imaging results that were available during my care of the patient were reviewed by me and considered in my medical decision making (see chart for details).     73 year old female with history of GERD, followed by GI, presenting for increased frequency of chronic nausea, decreased appetite and epigastric abdominal pain.  Patient does not appear to be in acute distress.  POCT urinalysis was unremarkable.  Discussed possibility of going to ER for further evaluation: Patient declined, would prefer to do labs  first and follow-up with her GI doctor on Monday.  Given Zofran ODT in office, tolerated this well and reported some improvement in her nausea.  Patient discharged prior to CBC, CMP, amylase, lipase being resulted as she looked well and felt she was able to go home.  Labs reviewed by provider, who contacted patient at home.  Amylase and  lipase within normal limits, CBC without leukocytosis, CMP showing stable renal function, no liver enzyme elevation.  Less suspicious for pancreatitis, cholecystitis, or choledocholithiasis, hepatitis.  Possible gastritis, gastric ulcer, esophagitis, hiatal hernia.  Patient states she will follow-up with her GI doctor on Monday and continue her PPI until then.  Patient verbalized understanding of strict ER precautions in the interim. Final Clinical Impressions(s) / UC Diagnoses   Final diagnoses:  Nausea without vomiting  Abdominal pain, unspecified abdominal location     Discharge Instructions     We will call you in regards to your lab results as soon as they are available. In the interim, should you develop worsening pain, nausea or develop vomiting, fever, chest pain, difficulty breathing, blood in urine, go to the ER for further evaluation.  May take Zofran under your tongue as needed for additional nausea relief. It is very important to follow-up with your GI doctor on Monday.    ED Prescriptions    Medication Sig Dispense Auth. Provider   ondansetron (ZOFRAN) 4 MG tablet Take 1 tablet (4 mg total) by mouth every 6 (six) hours. 12 tablet Hall-Potvin, [place], PA-C     Controlled Substance Prescriptions [place] Controlled Substance Registry consulted? Not Applicable   Quincy Sheehan, [place] 09/30/18 1928

## 2018-09-30 NOTE — ED Triage Notes (Signed)
Pt presents with generalized abdominal pain, nausea after eating , and some flank pain X 3 days.

## 2018-10-02 ENCOUNTER — Telehealth: Payer: Self-pay | Admitting: *Deleted

## 2018-10-02 ENCOUNTER — Other Ambulatory Visit: Payer: Self-pay | Admitting: *Deleted

## 2018-10-02 ENCOUNTER — Telehealth (INDEPENDENT_AMBULATORY_CARE_PROVIDER_SITE_OTHER): Payer: Medicare Other | Admitting: Internal Medicine

## 2018-10-02 ENCOUNTER — Telehealth: Payer: Self-pay | Admitting: Internal Medicine

## 2018-10-02 DIAGNOSIS — R1013 Epigastric pain: Secondary | ICD-10-CM

## 2018-10-02 DIAGNOSIS — R51 Headache: Secondary | ICD-10-CM | POA: Diagnosis not present

## 2018-10-02 DIAGNOSIS — I10 Essential (primary) hypertension: Secondary | ICD-10-CM | POA: Diagnosis not present

## 2018-10-02 DIAGNOSIS — K219 Gastro-esophageal reflux disease without esophagitis: Secondary | ICD-10-CM | POA: Diagnosis not present

## 2018-10-02 DIAGNOSIS — R002 Palpitations: Secondary | ICD-10-CM

## 2018-10-02 DIAGNOSIS — F419 Anxiety disorder, unspecified: Secondary | ICD-10-CM | POA: Diagnosis not present

## 2018-10-02 DIAGNOSIS — R634 Abnormal weight loss: Secondary | ICD-10-CM

## 2018-10-02 NOTE — Telephone Encounter (Signed)
Pt called stating that she went to [facility] ED this past Saturday due to abd pain, she was told to contact office to be seen asap. Pt is requesting to be seen today but there is no appts available until mid July. She would like to speak with Dr. Celesta Aver nurse.

## 2018-10-02 NOTE — Telephone Encounter (Signed)
Tell her I will call her a visit later today.  No need to put on schedule but explain I will do a telehealth visit - she will be billed  Needs to understand I want to try [employer] call - find out if she can do that via smart phone if not will do a regular telephone call.  Send back message  I cannot pinpoint time but thinking 5 ish

## 2018-10-02 NOTE — Telephone Encounter (Signed)
TELEHEALTH ENCOUNTER IN SETTING OF COVID-19 PANDEMIC - REQUESTED BY PATIENT SERVICE PROVIDED BY TELEMEDECINE - TYPE: telephone PATIENT LOCATION: home PATIENT HAS CONSENTED TO TELEHEALTH VISIT PROVIDER LOCATION: OFFICE REFERRING PROVIDER:N/A PARTICIPANTS OTHER THAN PATIENT:none TIME SPENT ON CALL:16 mins    Brianna Harper 73 y.o. 1945/11/23 [id number]  Assessment & Plan:   Encounter Diagnoses  Name Primary?  . Loss of weight Yes  . Abdominal pain, epigastric     Hx of functional GI problems but 11 # weight loss is sig so will do EGD. The risks and benefits as well as alternatives of endoscopic procedure(s) have been discussed and reviewed. All questions answered. The patient agrees to proceed.  I appreciate the opportunity to care for you. Gatha Mayer, MD, [facility]     Subjective:   Chief Complaint: epigastric pain and weight loss  HPI Since about January 2020 has been on multiple medications to treat headaches and has been having epigastric pain and about 11# weight loss. 7# in last 2 mos. Says when she eats almost immediately feels epigastric pain and has eaten less due to that. Called my obn call parner and now has been on bid omeprazole OTC x 3 days and about the same. Went to urgent care/ED and labs with slight t bili elevation, Na 134 adnn creat sl high but otherwise ok. Has tried Effexor, Baclofen, Gabapentin, SSRI at direction of Dr. Jannifer Franklin w/o help+ SE's. Has been on PPI in past w/ chest pain and functional heartburn. Lst EGD 2016 neg Allergies  Allergen Reactions  . Lisinopril Cough  . Erythromycin Ethylsuccinate Other (See Comments)    REACTION: GI upset  . Nitrofurantoin Hives and Swelling    macrobid   . Cymbalta [Duloxetine Hcl]     nausea  . Gabapentin     Bleeding gums  . Lidocaine     Unable to breath   . Lyrica [Pregabalin]     Bleeding gums  . Tizanidine     Hypotension   . Sulfadiazine Itching and Rash    Current Outpatient  Medications:  .  baclofen (LIORESAL) 10 MG tablet, 1/2 tablet at night for 1 week, then take 1/2 tablet twice daily, Disp: 15 each, Rfl: 1 .  cloNIDine (CATAPRES) 0.1 MG tablet, Take 0.1 mg by mouth. PRN, Disp: , Rfl:  .  metoprolol tartrate (LOPRESSOR) 25 MG tablet, Take 1 tablet (25 mg total) by mouth 2 (two) times daily., Disp: 30 tablet, Rfl: 3 .  Multiple Vitamin (MULTIVITAMIN) capsule, Take 1 capsule by mouth daily., Disp: , Rfl:  .  ondansetron (ZOFRAN) 4 MG tablet, Take 1 tablet (4 mg total) by mouth every 6 (six) hours., Disp: 12 tablet, Rfl: 0 .  spironolactone (ALDACTONE) 25 MG tablet, Take 25 mg by mouth daily. 1/2 tab daily, Disp: , Rfl:  .  VERAPAMIL HCL ER, CO, PO, Take 300 mg by mouth. , Disp: , Rfl:  Prilosec OTC bid Past Medical History:  Diagnosis Date  . Anxiety    no per pt  . 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 Cervicogenic headache 07/19/2018  . Depression   . GERD (gastroesophageal reflux disease)   . Hyperlipidemia   . Hypertension   . Laryngitis   . Migraine    "not very often anymore; they were more menopausal" (04/16/2013)  . Osteopenia    dexa -2.0 hip 2008  . Rapid heart rate    hx of    Past Surgical History:  Procedure Laterality Date  . COLONOSCOPY  02/03/2004   internal hemorrhoids  . UPPER GASTROINTESTINAL ENDOSCOPY  08/29/2008   gastritis, hiatal hernia   Social History   Social History Narrative   HHof 2    Non smoker   2 children  Also twins that did not survive birth   Married    CB x 2   No pets    family history includes Arthritis in her father; Hypertension in her mother; Migraines in her mother; Ovarian cancer in her maternal grandmother.   Review of Systems As per HPI

## 2018-10-02 NOTE — Telephone Encounter (Signed)
Patient reports that she went to Urgent Care yesterday for nausea and epigastric pain.  She spoke with on call MD on Saturday and was asked to start on OTC Prilosec 2 q am.  Patient reports that she still has the "discomfort".  This all stared approximately a month ago.  It is intermittent, but got worse yesterday with a meal.  Dr. Carlean Purl she was asked by the ED to follow up here.  No APP appts or openings with you. Please advise

## 2018-10-02 NOTE — Telephone Encounter (Signed)
    COVID-19 Pre-Screening Questions:  . In the past 7 to 10 days have you had a cough,  shortness of breath, headache, congestion, fever (100 or greater) body aches, chills, sore throat, or sudden loss of taste or sense of smell? . Have you been around anyone with known Covid 19. . Have you been around anyone who is awaiting Covid 19 test results in the past 7 to 10 days? . Have you been around anyone who has been exposed to Covid 19, or has mentioned symptoms of Covid 19 within the past 7 to 10 days?  If you have any concerns/questions about symptoms patients report during screening (either on the phone or at threshold). Contact the provider seeing the patient or DOD for further guidance.  If neither are available contact a member of the leadership team.   Patient answered no to the above questions.  Patient is aware she is not to arrive sooner than 15 minutes prior to her appointment time and to wear a facemask.  Patient aware she may not be accompanied to her appointment.

## 2018-10-02 NOTE — Telephone Encounter (Signed)
Patient is notified She will look for your link later this pm.

## 2018-10-03 NOTE — Addendum Note (Signed)
Addended by: Marlon Pel on: 10/03/2018 03:17 PM   Modules accepted: Orders

## 2018-10-03 NOTE — Telephone Encounter (Signed)
Patient has been scheduled for 10/07/18 8:00 for 9:00. She is aware I am emailing her instructions.  She will call for questions.  She will bring back the signed acknowledgement with her on Saturday to the procedure.

## 2018-10-03 NOTE — Telephone Encounter (Signed)
Left message for patient to call back  

## 2018-10-05 ENCOUNTER — Telehealth: Payer: Self-pay | Admitting: Internal Medicine

## 2018-10-05 NOTE — Telephone Encounter (Signed)
Pt is scheduled for EGD and reported that she has recently seen her PCP Dr. Dagmar Hait regarding fluctuation of heart rate.  Pt would like a call back to discuss.

## 2018-10-05 NOTE — Telephone Encounter (Signed)
She has noted some fluctuation in heart rate and dr. Dagmar Hait ordered an event monitor. No syncope.  I believe it is ok to proceed with EGD 7/11

## 2018-10-06 ENCOUNTER — Telehealth: Payer: Self-pay | Admitting: Internal Medicine

## 2018-10-06 NOTE — Telephone Encounter (Signed)

## 2018-10-06 NOTE — Telephone Encounter (Signed)
Patient is calling to set up a time to come in and get monitor. She is requesting to come in to the office.

## 2018-10-07 ENCOUNTER — Encounter: Payer: Self-pay | Admitting: Internal Medicine

## 2018-10-07 ENCOUNTER — Ambulatory Visit (AMBULATORY_SURGERY_CENTER): Payer: Medicare Other | Admitting: Internal Medicine

## 2018-10-07 ENCOUNTER — Encounter: Payer: Self-pay | Admitting: *Deleted

## 2018-10-07 ENCOUNTER — Other Ambulatory Visit: Payer: Self-pay

## 2018-10-07 VITALS — BP 143/70 | HR 67 | Temp 98.9°F | Resp 14

## 2018-10-07 DIAGNOSIS — R1013 Epigastric pain: Secondary | ICD-10-CM

## 2018-10-07 DIAGNOSIS — G8929 Other chronic pain: Secondary | ICD-10-CM

## 2018-10-07 DIAGNOSIS — R634 Abnormal weight loss: Secondary | ICD-10-CM

## 2018-10-07 MED ORDER — OMEPRAZOLE 20 MG PO CPDR: 20.0000 mg | DELAYED_RELEASE_CAPSULE | Freq: Every day | ORAL | 3 refills | Status: DC

## 2018-10-07 MED ORDER — SODIUM CHLORIDE 0.9 % IV SOLN: 500.0000 mL | Freq: Once | INTRAVENOUS | Status: DC

## 2018-10-07 NOTE — Progress Notes (Signed)
Upon discharge patient complain of chest discomfort to center of chest. Hyoscyamine given per Dr. Carlean Purl. Instructed to call back if symptoms worsen. Vital are stable. Color WNL.

## 2018-10-07 NOTE — Progress Notes (Signed)
To PACU, VSS. Report to Rn.tb 

## 2018-10-07 NOTE — Patient Instructions (Addendum)
The esophagus, stomach and duodenum all look ok.  I think a variety of factors may be at play but good news here. Stay on Prilosec (omeprazole) 20 mg daily - I sent an Rx.  Please try FD Donald Prose before meals (take 2) and see what that does IF symptoms return despite Prilosec  If you have more or other problems let me know (hope not!)    I appreciate the opportunity to care for you. Gatha Mayer, MD, [facility]  Discharge instructions given. Normal exam. Resume previous medications. YOU HAD AN ENDOSCOPIC PROCEDURE TODAY AT [facility]:   Refer to the procedure report that was given to you for any specific questions about what was found during the examination.  If the procedure report does not answer your questions, please call your gastroenterologist to clarify.  If you requested that your care partner not be given the details of your procedure findings, then the procedure report has been included in a sealed envelope for you to review at your convenience later.  YOU SHOULD EXPECT: Some feelings of bloating in the abdomen. Passage of more gas than usual.  Walking can help get rid of the air that was put into your GI tract during the procedure and reduce the bloating. If you had a lower endoscopy (such as a colonoscopy or flexible sigmoidoscopy) you may notice spotting of blood in your stool or on the toilet paper. If you underwent a bowel prep for your procedure, you may not have a normal bowel movement for a few days.  Please Note:  You might notice some irritation and congestion in your nose or some drainage.  This is from the oxygen used during your procedure.  There is no need for concern and it should clear up in a day or so.  SYMPTOMS TO REPORT IMMEDIATELY:    Following upper endoscopy (EGD)  Vomiting of blood or coffee ground material  New chest pain or pain under the shoulder blades  Painful or persistently difficult swallowing  New shortness of breath  Fever of 100F or higher  Black, tarry-looking stools  For urgent or emergent issues, a gastroenterologist can be reached at any hour by calling [phone].   DIET:  We do recommend a small meal at first, but then you may proceed to your regular diet.  Drink plenty of fluids but you should avoid alcoholic beverages for 24 hours.  ACTIVITY:  You should plan to take it easy for the rest of today and you should NOT DRIVE or use heavy machinery until tomorrow (because of the sedation medicines used during the test).    FOLLOW UP: Our staff will call the number listed on your records 48-72 hours following your procedure to check on you and address any questions or concerns that you may have regarding the information given to you following your procedure. If we do not reach you, we will leave a message.  We will attempt to reach you two times.  During this call, we will ask if you have developed any symptoms of COVID 19. If you develop any symptoms (ie: fever, flu-like symptoms, shortness of breath, cough etc.) before then, please call [phone].  If you test positive for Covid 19 in the 2 weeks post procedure, please call and report this information to [place].    If any biopsies were taken you will be contacted by phone or by letter within the next 1-3 weeks.  Please call us at [phone] if  you have not heard about the biopsies in 3 weeks.    SIGNATURES/CONFIDENTIALITY: You and/or your care partner have signed paperwork which will be entered into your electronic medical record.  These signatures attest to the fact that that the information above on your After Visit Summary has been reviewed and is understood.  Full responsibility of the confidentiality of this discharge information lies with you and/or your care-partner.

## 2018-10-07 NOTE — Progress Notes (Signed)
Pt's states no medical or surgical changes since previsit or office visit. 

## 2018-10-07 NOTE — Op Note (Addendum)
[facility] Patient Name: Brianna Harper Procedure Date: 10/07/2018 9:19 AM MRN: [id number] Endoscopist: Gatha Mayer , MD Age: 73 Referring MD:  Date of Birth: [birth year] Gender: Female Account #: [id number] Procedure:                Upper GI endoscopy Indications:              Epigastric abdominal pain, Weight loss Medicines:                Propofol per Anesthesia, Monitored Anesthesia Care Procedure:                Pre-Anesthesia Assessment:                           - Prior to the procedure, a History and Physical                            was performed, and patient medications and                            allergies were reviewed. The patient's tolerance of                            previous anesthesia was also reviewed. The risks                            and benefits of the procedure and the sedation                            options and risks were discussed with the patient.                            All questions were answered, and informed consent                            was obtained. Prior Anticoagulants: The patient has                            taken no previous anticoagulant or antiplatelet                            agents. ASA Grade Assessment: II - A patient with                            mild systemic disease. After reviewing the risks                            and benefits, the patient was deemed in                            satisfactory condition to undergo the procedure.                           After obtaining informed consent, the endoscope was  passed under direct vision. Throughout the                            procedure, the patient's blood pressure, pulse, and                            oxygen saturations were monitored continuously. The                            Model GIF-[id number] scope was introduced                            through the mouth, and advanced to the second part              of duodenum. The upper GI endoscopy was                            accomplished without difficulty. The patient                            tolerated the procedure well. Scope In: Scope Out: Findings:                 The esophagus was normal.                           The stomach was normal.                           The examined duodenum was normal.                           The cardia and gastric fundus were normal on                            retroflexion. Complications:            No immediate complications. Estimated Blood Loss:     Estimated blood loss: none. Impression:               - Normal esophagus.                           - Normal stomach.                           - Normal examined duodenum.                           - No specimens collected. Recommendation:           - Patient has a contact number available for                            emergencies. The signs and symptoms of potential                            delayed complications were discussed with the  patient. Return to normal activities tomorrow.                            Written discharge instructions were provided to the                            patient.                           - Resume previous diet.                           - Continue present medications. Omeprazole 20 mg                            helping so continue                           - Try FD Gard 2 before meals if that stops helping                           Suspect medication side effcts, functional/stress                            issues at work. Gatha Mayer, MD 10/07/2018 9:40:09 AM This report has been signed electronically.

## 2018-10-07 NOTE — Progress Notes (Signed)
Temp per Riki Sheer

## 2018-10-10 ENCOUNTER — Ambulatory Visit: Payer: Medicare Other | Admitting: Neurology

## 2018-10-10 ENCOUNTER — Telehealth: Payer: Self-pay

## 2018-10-10 DIAGNOSIS — R109 Unspecified abdominal pain: Secondary | ICD-10-CM

## 2018-10-10 DIAGNOSIS — R634 Abnormal weight loss: Secondary | ICD-10-CM

## 2018-10-10 DIAGNOSIS — G8929 Other chronic pain: Secondary | ICD-10-CM

## 2018-10-10 MED ORDER — DICYCLOMINE HCL 20 MG PO TABS: 20.0000 mg | ORAL_TABLET | Freq: Four times a day (QID) | ORAL | 0 refills | Status: DC | PRN

## 2018-10-10 NOTE — Telephone Encounter (Signed)
  Follow up Call-  Call back number 10/07/2018  Post procedure Call Back phone  # [phone]  Permission to leave phone message Yes  Some recent data might be hidden     Patient questions:  Do you have a fever, pain , or abdominal swelling? Yes.   Pain Score  9 *  Have you tolerated food without any problems? Yes.    Have you been able to return to your normal activities? No.  Do you have any questions about your discharge instructions: Diet   No. Medications  No. Follow up visit  No.  Do you have questions or concerns about your Care? Yes.    Actions: * If pain score is 4 or above: Physician/ provider Notified : Silvano Rusk, MD   1. Have you developed a fever since your procedure? no  2.   Have you had an respiratory symptoms (SOB or cough) since your procedure? no  3.   Have you tested positive for COVID 19 since your procedure no  4.   Have you had any family members/close contacts diagnosed with the COVID 19 since your procedure?  no   If yes to any of these questions please route to Joylene John, RN and Alphonsa Gin, Therapist, sports. Marland Kitchen

## 2018-10-10 NOTE — Telephone Encounter (Signed)
Am ccing my office RN  Will evaluate with CT abd/pelvis with contrast  Dx lwer abdominal pain, weight loss  Treat pain with dicyclomine 20 mg q 6 hrs prn  # 60 no refill

## 2018-10-10 NOTE — Telephone Encounter (Signed)
Patient is scheduled for [facility] CT on 10/18/18 10:30 am.  She is asked to pick up her contrast prior to the CT in the radiology department. Rx sent

## 2018-10-12 DIAGNOSIS — R946 Abnormal results of thyroid function studies: Secondary | ICD-10-CM | POA: Diagnosis not present

## 2018-10-12 DIAGNOSIS — I10 Essential (primary) hypertension: Secondary | ICD-10-CM | POA: Diagnosis not present

## 2018-10-12 DIAGNOSIS — R82998 Other abnormal findings in urine: Secondary | ICD-10-CM | POA: Diagnosis not present

## 2018-10-12 NOTE — Telephone Encounter (Signed)
Pt called to let you know that her CT scan was changed to 7/20 at 2:45pm at [facility]. She said that if it creates an issue to call her.

## 2018-10-16 ENCOUNTER — Ambulatory Visit (HOSPITAL_COMMUNITY)
Admission: RE | Admit: 2018-10-16 | Discharge: 2018-10-16 | Disposition: A | Discharge status: Home / Self Care | Payer: Medicare Other | Source: Ambulatory Visit | Attending: Internal Medicine | Admitting: Internal Medicine

## 2018-10-16 ENCOUNTER — Encounter (HOSPITAL_COMMUNITY): Payer: Self-pay

## 2018-10-16 ENCOUNTER — Other Ambulatory Visit: Payer: Self-pay

## 2018-10-16 DIAGNOSIS — R1013 Epigastric pain: Secondary | ICD-10-CM | POA: Diagnosis not present

## 2018-10-16 DIAGNOSIS — R197 Diarrhea, unspecified: Secondary | ICD-10-CM | POA: Diagnosis not present

## 2018-10-16 DIAGNOSIS — R634 Abnormal weight loss: Secondary | ICD-10-CM | POA: Insufficient documentation

## 2018-10-16 DIAGNOSIS — R1030 Lower abdominal pain, unspecified: Secondary | ICD-10-CM | POA: Diagnosis not present

## 2018-10-16 DIAGNOSIS — G8929 Other chronic pain: Secondary | ICD-10-CM | POA: Insufficient documentation

## 2018-10-16 DIAGNOSIS — R109 Unspecified abdominal pain: Secondary | ICD-10-CM | POA: Insufficient documentation

## 2018-10-16 MED ORDER — SODIUM CHLORIDE (PF) 0.9 % IJ SOLN
INTRAMUSCULAR | Status: AC
  Filled 2018-10-16: qty 50

## 2018-10-16 MED ORDER — IOHEXOL 300 MG/ML  SOLN
100.0000 mL | Freq: Once | INTRAMUSCULAR | Status: AC | PRN
  Administered 2018-10-16: 15:00:00 100 mL via INTRAVENOUS

## 2018-10-17 NOTE — Telephone Encounter (Signed)
Patient called to reschedule appointment to have her cardiac event monitor applied.  Patient states, she has a physical with her PCP on Thursday, and would like to discuss why the monitor was ordered with him prior to rescheduling.  She will call us to reschedule if necessary.

## 2018-10-17 NOTE — Progress Notes (Signed)
Brianna Harper,  The CT scan does not show any significant problems. It does raise a ? Of pelvic congestion syndrome and that can cause some lower abdominal pain or pelvic pain but is an ill-defined problem that is not clear cut.  Bottom line is that no cancer or infection and overall good news.  How are you?

## 2018-10-18 ENCOUNTER — Ambulatory Visit (HOSPITAL_COMMUNITY): Payer: Medicare Other

## 2018-10-19 ENCOUNTER — Telehealth: Payer: Self-pay | Admitting: Neurology

## 2018-10-19 DIAGNOSIS — R946 Abnormal results of thyroid function studies: Secondary | ICD-10-CM | POA: Diagnosis not present

## 2018-10-19 DIAGNOSIS — R51 Headache: Secondary | ICD-10-CM | POA: Diagnosis not present

## 2018-10-19 DIAGNOSIS — Z1339 Encounter for screening examination for other mental health and behavioral disorders: Secondary | ICD-10-CM | POA: Diagnosis not present

## 2018-10-19 DIAGNOSIS — F419 Anxiety disorder, unspecified: Secondary | ICD-10-CM | POA: Diagnosis not present

## 2018-10-19 DIAGNOSIS — I10 Essential (primary) hypertension: Secondary | ICD-10-CM | POA: Diagnosis not present

## 2018-10-19 DIAGNOSIS — Z1331 Encounter for screening for depression: Secondary | ICD-10-CM | POA: Diagnosis not present

## 2018-10-19 DIAGNOSIS — Z0000 Encounter for general adult medical examination without abnormal findings: Secondary | ICD-10-CM | POA: Diagnosis not present

## 2018-10-19 DIAGNOSIS — E785 Hyperlipidemia, unspecified: Secondary | ICD-10-CM | POA: Diagnosis not present

## 2018-10-19 DIAGNOSIS — K219 Gastro-esophageal reflux disease without esophagitis: Secondary | ICD-10-CM | POA: Diagnosis not present

## 2018-10-19 DIAGNOSIS — J309 Allergic rhinitis, unspecified: Secondary | ICD-10-CM | POA: Diagnosis not present

## 2018-10-19 DIAGNOSIS — R002 Palpitations: Secondary | ICD-10-CM | POA: Diagnosis not present

## 2018-10-19 NOTE — Telephone Encounter (Signed)
Blood work done by primary care physician on 19 October 2018:  Free T4 of 0.9, chemistry panel with a sodium of 143, potassium 4.8, chloride of 107, CO2 26, calcium 9.8, total protein 6.4, albumin 4.2, liver profile is unremarkable.  White blood count of 5.3, hemoglobin of 13.9, hematocrit 42.1, MCV of 97.8, platelets of 226.  TSH of 4.56.

## 2018-10-26 DIAGNOSIS — R51 Headache: Secondary | ICD-10-CM | POA: Diagnosis not present

## 2018-10-26 DIAGNOSIS — M5481 Occipital neuralgia: Secondary | ICD-10-CM | POA: Diagnosis not present

## 2018-11-07 DIAGNOSIS — I10 Essential (primary) hypertension: Secondary | ICD-10-CM | POA: Diagnosis not present

## 2018-11-07 DIAGNOSIS — R946 Abnormal results of thyroid function studies: Secondary | ICD-10-CM | POA: Diagnosis not present

## 2018-11-07 DIAGNOSIS — R0600 Dyspnea, unspecified: Secondary | ICD-10-CM | POA: Diagnosis not present

## 2018-11-07 DIAGNOSIS — F419 Anxiety disorder, unspecified: Secondary | ICD-10-CM | POA: Diagnosis not present

## 2018-12-01 DIAGNOSIS — R6884 Jaw pain: Secondary | ICD-10-CM | POA: Diagnosis not present

## 2018-12-01 DIAGNOSIS — K0889 Other specified disorders of teeth and supporting structures: Secondary | ICD-10-CM | POA: Diagnosis not present

## 2018-12-01 DIAGNOSIS — I10 Essential (primary) hypertension: Secondary | ICD-10-CM | POA: Diagnosis not present

## 2018-12-01 DIAGNOSIS — M47812 Spondylosis without myelopathy or radiculopathy, cervical region: Secondary | ICD-10-CM | POA: Diagnosis not present

## 2018-12-01 DIAGNOSIS — M542 Cervicalgia: Secondary | ICD-10-CM | POA: Diagnosis not present

## 2018-12-01 DIAGNOSIS — F419 Anxiety disorder, unspecified: Secondary | ICD-10-CM | POA: Diagnosis not present

## 2018-12-01 DIAGNOSIS — M5481 Occipital neuralgia: Secondary | ICD-10-CM | POA: Diagnosis not present

## 2018-12-01 DIAGNOSIS — G8929 Other chronic pain: Secondary | ICD-10-CM | POA: Diagnosis not present

## 2018-12-11 DIAGNOSIS — R51 Headache: Secondary | ICD-10-CM | POA: Diagnosis not present

## 2018-12-11 DIAGNOSIS — K1379 Other lesions of oral mucosa: Secondary | ICD-10-CM | POA: Diagnosis not present

## 2018-12-15 DIAGNOSIS — Z23 Encounter for immunization: Secondary | ICD-10-CM | POA: Diagnosis not present

## 2018-12-28 DIAGNOSIS — H5203 Hypermetropia, bilateral: Secondary | ICD-10-CM | POA: Diagnosis not present

## 2018-12-28 DIAGNOSIS — H2513 Age-related nuclear cataract, bilateral: Secondary | ICD-10-CM | POA: Diagnosis not present

## 2018-12-28 DIAGNOSIS — H04123 Dry eye syndrome of bilateral lacrimal glands: Secondary | ICD-10-CM | POA: Diagnosis not present

## 2018-12-29 DIAGNOSIS — G629 Polyneuropathy, unspecified: Secondary | ICD-10-CM | POA: Diagnosis not present

## 2018-12-29 DIAGNOSIS — R519 Headache, unspecified: Secondary | ICD-10-CM | POA: Diagnosis not present

## 2018-12-29 DIAGNOSIS — K1379 Other lesions of oral mucosa: Secondary | ICD-10-CM | POA: Diagnosis not present

## 2018-12-29 DIAGNOSIS — M797 Fibromyalgia: Secondary | ICD-10-CM | POA: Diagnosis not present

## 2019-01-04 ENCOUNTER — Encounter: Payer: Self-pay | Admitting: Neurology

## 2019-01-04 ENCOUNTER — Encounter

## 2019-01-04 ENCOUNTER — Other Ambulatory Visit: Payer: Self-pay

## 2019-01-04 ENCOUNTER — Ambulatory Visit (INDEPENDENT_AMBULATORY_CARE_PROVIDER_SITE_OTHER): Payer: Medicare Other | Admitting: Neurology

## 2019-01-04 VITALS — BP 151/80 | HR 74 | Temp 98.4°F | Ht 66.0 in | Wt 136.0 lb

## 2019-01-04 DIAGNOSIS — G4486 Cervicogenic headache: Secondary | ICD-10-CM

## 2019-01-04 DIAGNOSIS — I63039 Cerebral infarction due to thrombosis of unspecified carotid artery: Secondary | ICD-10-CM | POA: Diagnosis not present

## 2019-01-04 DIAGNOSIS — R519 Headache, unspecified: Secondary | ICD-10-CM | POA: Diagnosis not present

## 2019-01-04 MED ORDER — BACLOFEN 10 MG PO TABS: 10.0000 mg | ORAL_TABLET | Freq: Three times a day (TID) | ORAL | Status: DC

## 2019-01-04 NOTE — Progress Notes (Addendum)
Reason for visit: Headache  Brianna Harper is an 73 y.o. female  History of present illness:  Brianna Harper is a 72 year old right-handed white female with a history of what was felt to be a cervicogenic headache.  The patient responded dramatically to gabapentin, but this was felt to bring on problems with lichen planus with her gums.  The patient however has been seen by a periodontist who does not believe that she has lichen planus.  She has been seen by Dr. Quentin Angst at [facility], he referred her to Dr. Van Clines.  The patient was told to go on [place] which was too expensive, she was placed on low-dose naltrexone taking 1 or 2 mg daily.  She has had dental extractions which offered no benefit, and now she has had spread of the pain in her gums from the right side to the left side.  She still has a lot of sensitivity and soreness with the muscle insertion points in the back of the head on the right and including the mastoid process on the right.  The patient has some neck stiffness, she has pain mainly when extending the neck.  She now has developed bitemporal pain.  She has a dull constant achy pain in the gums on both sides, right greater than left.  She has pain with chewing, she has to eat soft foods.  She will be going for skin biopsy apparently to exclude a small fiber neuropathy.  She has had 1 nerve block including the C3 ganglia and occipital nerve on the right that offered no benefit.  She returns this office for an evaluation.  She is on baclofen taking 10 mg 3 times daily.  Past Medical History:  Diagnosis Date  . Anxiety    no per pt  . 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 Cervicogenic headache 07/19/2018  . Depression   . GERD (gastroesophageal reflux disease)   . Hyperlipidemia   . Hypertension   . Laryngitis   . Migraine    "not very often anymore; they were more menopausal" (04/16/2013)  . Osteopenia    dexa -2.0 hip 2008  . Rapid heart rate    hx of     Past Surgical History:  Procedure Laterality Date  . COLONOSCOPY  02/03/2004   internal hemorrhoids  . UPPER GASTROINTESTINAL ENDOSCOPY  08/29/2008   gastritis, hiatal hernia    Family History  Problem Relation Age of Onset  . Hypertension Mother   . Migraines Mother   . Arthritis Father   . Ovarian cancer Maternal Grandmother   . Seizures Neg Hx   . Esophageal cancer Neg Hx   . Colon cancer Neg Hx   . Pancreatic cancer Neg Hx   . Stomach cancer Neg Hx   . Liver disease Neg Hx   . Inflammatory bowel disease Neg Hx   . Rectal cancer Neg Hx   . Breast cancer Neg Hx     Social history:  reports that she has never smoked. She has never used smokeless tobacco. She reports previous alcohol use. She reports that she does not use drugs.    Allergies  Allergen Reactions  . Lisinopril Cough  . Erythromycin Ethylsuccinate Other (See Comments)    REACTION: GI upset  . Nitrofurantoin Hives and Swelling    macrobid   . Cymbalta [Duloxetine Hcl]     nausea  . Gabapentin     Bleeding gums  . Lidocaine  Unable to breath   . Lyrica [Pregabalin]     Bleeding gums  . Tizanidine     Hypotension   . Sulfadiazine Itching and Rash    Medications:  Prior to Admission medications   Medication Sig Start Date End Date Taking? Authorizing Provider  baclofen (LIORESAL) 10 MG tablet Take 1 tablet (10 mg total) by mouth 3 (three) times daily. 1 tab tid 01/04/19  Yes Kathrynn Ducking, MD  cloNIDine (CATAPRES) 0.1 MG tablet Take 0.1 mg by mouth. PRN   Yes [provider]  metoprolol tartrate (LOPRESSOR) 25 MG tablet Take 1 tablet (25 mg total) by mouth 2 (two) times daily. 06/24/14  Yes Panosh, Standley Brooking, MD  Multiple Vitamin (MULTIVITAMIN) capsule Take 1 capsule by mouth daily.   Yes [provider]  omeprazole (PRILOSEC) 20 MG capsule Take 1 capsule (20 mg total) by mouth daily. 10/07/18  Yes Gatha Mayer, MD  spironolactone (ALDACTONE) 25 MG tablet Take 25 mg by mouth  daily. 1/2 tab daily 03/06/18  Yes [provider]  VERAPAMIL HCL ER, CO, PO Take 300 mg by mouth.    Yes [provider]    ROS:  Out of a complete 14 system review of symptoms, the patient complains only of the following symptoms, and all other reviewed systems are negative.  Headache, abdominal pain  Blood pressure (!) 151/80, pulse 74, temperature 98.4 F (36.9 C), temperature source Temporal, height 5\' 6"  (1.676 m), weight 136 lb (61.7 kg).  Physical Exam  General: The patient is alert and cooperative at the time of the examination.   Neuromuscular: The patient has fairly good rotational movement of the cervical spine, she has discomfort with extension of the neck.  Skin: No significant peripheral edema is noted.   Neurologic Exam  Mental status: The patient is alert and oriented x 3 at the time of the examination. The patient has apparent normal recent and remote memory, with an apparently normal attention span and concentration ability.   Cranial nerves: Facial symmetry is present. Speech is normal, no aphasia or dysarthria is noted. Extraocular movements are full. Visual fields are full.  Motor: The patient has good strength in all 4 extremities.  Sensory examination: Soft touch sensation is symmetric on the face, arms, and legs.  Coordination: The patient has good finger-nose-finger and heel-to-shin bilaterally.  Gait and station: The patient has a normal gait. Tandem gait is normal. Romberg is negative. No drift is seen.  Reflexes: Deep tendon reflexes are symmetric.   Assessment/Plan:  1.  Cervicogenic headache, right  2.  Bilateral gum and jaw discomfort, right greater than left  It is certainly possible that C2-3 ganglia dysfunction could lead to many of her symptoms.  The patient clearly has neuromuscular symptoms within muscle insertion points on the back of the head on the right.  She will go up on the back from taking 10 mg twice during  the day and 20 mg at night.  The patient will continue to follow-up through [facility].  In the future, we may try other therapies such as carbamazepine or Lamictal.  The patient may desire another opinion through a periodontist.  Greater than 50% of the visit was spent in counseling and coordination of care.  Face-to-face time with the patient was 30 minutes.   Jill Alexanders MD 01/04/2019 11:37 AM  [facility] [address] [place], [place] [address]  Phone [phone] Fax [phone]

## 2019-01-04 NOTE — Addendum Note (Signed)
Addended by: Kathrynn Ducking on: 01/04/2019 07:06 PM   Modules accepted: Level of Service

## 2019-01-12 DIAGNOSIS — G629 Polyneuropathy, unspecified: Secondary | ICD-10-CM | POA: Diagnosis not present

## 2019-01-12 DIAGNOSIS — M797 Fibromyalgia: Secondary | ICD-10-CM | POA: Diagnosis not present

## 2019-01-12 DIAGNOSIS — R519 Headache, unspecified: Secondary | ICD-10-CM | POA: Diagnosis not present

## 2019-01-12 DIAGNOSIS — K1379 Other lesions of oral mucosa: Secondary | ICD-10-CM | POA: Diagnosis not present

## 2019-01-12 DIAGNOSIS — M792 Neuralgia and neuritis, unspecified: Secondary | ICD-10-CM | POA: Diagnosis not present

## 2019-01-22 DIAGNOSIS — M5412 Radiculopathy, cervical region: Secondary | ICD-10-CM | POA: Diagnosis not present

## 2019-01-22 DIAGNOSIS — G8929 Other chronic pain: Secondary | ICD-10-CM | POA: Diagnosis not present

## 2019-01-22 DIAGNOSIS — M5481 Occipital neuralgia: Secondary | ICD-10-CM | POA: Diagnosis not present

## 2019-01-22 DIAGNOSIS — R519 Headache, unspecified: Secondary | ICD-10-CM | POA: Diagnosis not present

## 2019-01-22 DIAGNOSIS — G4701 Insomnia due to medical condition: Secondary | ICD-10-CM | POA: Diagnosis not present

## 2019-02-02 DIAGNOSIS — R300 Dysuria: Secondary | ICD-10-CM | POA: Diagnosis not present

## 2019-02-07 DIAGNOSIS — R519 Headache, unspecified: Secondary | ICD-10-CM | POA: Diagnosis not present

## 2019-02-07 DIAGNOSIS — F419 Anxiety disorder, unspecified: Secondary | ICD-10-CM | POA: Diagnosis not present

## 2019-02-07 DIAGNOSIS — I10 Essential (primary) hypertension: Secondary | ICD-10-CM | POA: Diagnosis not present

## 2019-02-07 DIAGNOSIS — R109 Unspecified abdominal pain: Secondary | ICD-10-CM | POA: Diagnosis not present

## 2019-02-07 DIAGNOSIS — R300 Dysuria: Secondary | ICD-10-CM | POA: Diagnosis not present

## 2019-02-09 ENCOUNTER — Other Ambulatory Visit: Payer: Self-pay | Admitting: Internal Medicine

## 2019-02-09 DIAGNOSIS — R109 Unspecified abdominal pain: Secondary | ICD-10-CM

## 2019-02-13 DIAGNOSIS — R102 Pelvic and perineal pain: Secondary | ICD-10-CM | POA: Diagnosis not present

## 2019-02-15 DIAGNOSIS — R102 Pelvic and perineal pain: Secondary | ICD-10-CM | POA: Diagnosis not present

## 2019-02-15 DIAGNOSIS — R350 Frequency of micturition: Secondary | ICD-10-CM | POA: Diagnosis not present

## 2019-02-15 DIAGNOSIS — N898 Other specified noninflammatory disorders of vagina: Secondary | ICD-10-CM | POA: Diagnosis not present

## 2019-02-15 DIAGNOSIS — N952 Postmenopausal atrophic vaginitis: Secondary | ICD-10-CM | POA: Diagnosis not present

## 2019-02-16 ENCOUNTER — Other Ambulatory Visit: Payer: Medicare Other

## 2019-03-01 DIAGNOSIS — N393 Stress incontinence (female) (male): Secondary | ICD-10-CM | POA: Diagnosis not present

## 2019-03-06 DIAGNOSIS — M6281 Muscle weakness (generalized): Secondary | ICD-10-CM | POA: Diagnosis not present

## 2019-03-06 DIAGNOSIS — N393 Stress incontinence (female) (male): Secondary | ICD-10-CM | POA: Diagnosis not present

## 2019-03-06 DIAGNOSIS — R1030 Lower abdominal pain, unspecified: Secondary | ICD-10-CM | POA: Diagnosis not present

## 2019-03-06 DIAGNOSIS — R300 Dysuria: Secondary | ICD-10-CM | POA: Diagnosis not present

## 2019-03-06 DIAGNOSIS — R102 Pelvic and perineal pain: Secondary | ICD-10-CM | POA: Diagnosis not present

## 2019-03-06 DIAGNOSIS — M62838 Other muscle spasm: Secondary | ICD-10-CM | POA: Diagnosis not present

## 2019-03-08 DIAGNOSIS — R102 Pelvic and perineal pain: Secondary | ICD-10-CM | POA: Diagnosis not present

## 2019-03-08 DIAGNOSIS — R300 Dysuria: Secondary | ICD-10-CM | POA: Diagnosis not present

## 2019-03-08 DIAGNOSIS — M6281 Muscle weakness (generalized): Secondary | ICD-10-CM | POA: Diagnosis not present

## 2019-03-08 DIAGNOSIS — M62838 Other muscle spasm: Secondary | ICD-10-CM | POA: Diagnosis not present

## 2019-03-08 DIAGNOSIS — R1030 Lower abdominal pain, unspecified: Secondary | ICD-10-CM | POA: Diagnosis not present

## 2019-03-08 DIAGNOSIS — N393 Stress incontinence (female) (male): Secondary | ICD-10-CM | POA: Diagnosis not present

## 2019-04-02 DIAGNOSIS — G500 Trigeminal neuralgia: Secondary | ICD-10-CM | POA: Diagnosis not present

## 2019-04-02 DIAGNOSIS — G8929 Other chronic pain: Secondary | ICD-10-CM | POA: Diagnosis not present

## 2019-04-02 DIAGNOSIS — G4701 Insomnia due to medical condition: Secondary | ICD-10-CM | POA: Diagnosis not present

## 2019-04-02 DIAGNOSIS — R519 Headache, unspecified: Secondary | ICD-10-CM | POA: Diagnosis not present

## 2019-04-02 DIAGNOSIS — G501 Atypical facial pain: Secondary | ICD-10-CM | POA: Diagnosis not present

## 2019-04-02 DIAGNOSIS — M5412 Radiculopathy, cervical region: Secondary | ICD-10-CM | POA: Diagnosis not present

## 2019-04-02 DIAGNOSIS — M5481 Occipital neuralgia: Secondary | ICD-10-CM | POA: Diagnosis not present

## 2019-04-03 ENCOUNTER — Other Ambulatory Visit: Payer: Self-pay | Admitting: Obstetrics and Gynecology

## 2019-04-03 DIAGNOSIS — R102 Pelvic and perineal pain: Secondary | ICD-10-CM

## 2019-04-12 ENCOUNTER — Ambulatory Visit
Admission: RE | Admit: 2019-04-12 | Discharge: 2019-04-12 | Disposition: A | Discharge status: Home / Self Care | Payer: Medicare Other | Source: Ambulatory Visit | Attending: Obstetrics and Gynecology | Admitting: Obstetrics and Gynecology

## 2019-04-12 ENCOUNTER — Encounter: Payer: Self-pay | Admitting: *Deleted

## 2019-04-12 DIAGNOSIS — R102 Pelvic and perineal pain: Secondary | ICD-10-CM

## 2019-04-12 DIAGNOSIS — D259 Leiomyoma of uterus, unspecified: Secondary | ICD-10-CM | POA: Diagnosis not present

## 2019-04-12 HISTORY — PX: IR RADIOLOGIST EVAL & MGMT: IMG5224

## 2019-04-12 NOTE — Consult Note (Signed)
Chief Complaint: Patient was seen in consultation today for left pelvic pain at the request of Richardson,Kathy  Referring Physician(s): Richardson,Kathy  History of Present Illness: Brianna Harper is a 74 y.o. female who describes low left pelvic pain since July 2020.  No fall or other inciting event. The pain is improved when supine, exacerbated by sitting and standing, although not progressive over the course of the day.  Pain is not seemingly related to menstrual period.  No association with movement.  Some alleviation after micturition.  Underwent full GI work-up that was negative.  She underwent pelvic floor evaluation by urology, and has participated in rehab, with no change in symptoms.  Patient describes bilateral lower extremity varicose vein symptoms, left worse than right in the anterior thighs.  New vaginal discharge.  Unrevealing gynecologic evaluation. Past Medical History:  Diagnosis Date  . Anxiety    no per pt  . 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 Cervicogenic headache 07/19/2018  . Depression   . GERD (gastroesophageal reflux disease)   . Hyperlipidemia   . Hypertension   . Laryngitis   . Migraine    "not very often anymore; they were more menopausal" (04/16/2013)  . Osteopenia    dexa -2.0 hip 2008  . Rapid heart rate    hx of     Past Surgical History:  Procedure Laterality Date  . COLONOSCOPY  02/03/2004   internal hemorrhoids  . IR RADIOLOGIST EVAL & MGMT  04/12/2019  . UPPER GASTROINTESTINAL ENDOSCOPY  08/29/2008   gastritis, hiatal hernia    Allergies: Lisinopril, Erythromycin ethylsuccinate, Nitrofurantoin, Cymbalta [duloxetine hcl], Gabapentin, Lidocaine, Lyrica [pregabalin], Tizanidine, and Sulfadiazine  Medications: Prior to Admission medications   Medication Sig Start Date End Date Taking? Authorizing Provider  baclofen (LIORESAL) 10 MG tablet Take 1 tablet (10 mg total) by mouth 3 (three) times daily. 1 tab tid 01/04/19    Kathrynn Ducking, MD  cloNIDine (CATAPRES) 0.1 MG tablet Take 0.1 mg by mouth. PRN    [provider]  metoprolol tartrate (LOPRESSOR) 25 MG tablet Take 1 tablet (25 mg total) by mouth 2 (two) times daily. 06/24/14   Panosh, Standley Brooking, MD  Multiple Vitamin (MULTIVITAMIN) capsule Take 1 capsule by mouth daily.    [provider]  omeprazole (PRILOSEC) 20 MG capsule Take 1 capsule (20 mg total) by mouth daily. 10/07/18   Gatha Mayer, MD  spironolactone (ALDACTONE) 25 MG tablet Take 25 mg by mouth daily. 1/2 tab daily 03/06/18   [provider]  VERAPAMIL HCL ER, CO, PO Take 300 mg by mouth.     [provider]     Family History  Problem Relation Age of Onset  . Hypertension Mother   . Migraines Mother   . Arthritis Father   . Ovarian cancer Maternal Grandmother   . Seizures Neg Hx   . Esophageal cancer Neg Hx   . Colon cancer Neg Hx   . Pancreatic cancer Neg Hx   . Stomach cancer Neg Hx   . Liver disease Neg Hx   . Inflammatory bowel disease Neg Hx   . Rectal cancer Neg Hx   . Breast cancer Neg Hx     Social History   Socioeconomic History  . Marital status: Married    Spouse name: Not on file  . Number of children: 2  . Years of education: college BS  . Highest education level: Not  on file  Occupational History  . Occupation: retired  Tobacco Use  . Smoking status: Never Smoker  . Smokeless tobacco: Never Used  Substance and Sexual Activity  . Alcohol use: Not Currently    Alcohol/week: 0.0 standard drinks    Comment: extremely rare ; only on special occasion  . Drug use: No  . Sexual activity: Yes  Other Topics Concern  . Not on file  Social History Narrative   HHof 2    Non smoker   2 children  Also twins that did not survive birth   Married    CB x 2   No pets    Social Determinants of Radio broadcast assistant Strain:   . Difficulty of Paying Living Expenses: Not on file  Food Insecurity:   . Worried About Paediatric nurse in the Last Year: Not on file  . Ran Out of Food in the Last Year: Not on file  Transportation Needs:   . Lack of Transportation (Medical): Not on file  . Lack of Transportation (Non-Medical): Not on file  Physical Activity:   . Days of Exercise per Week: Not on file  . Minutes of Exercise per Session: Not on file  Stress:   . Feeling of Stress : Not on file  Social Connections:   . Frequency of Communication with Friends and Family: Not on file  . Frequency of Social Gatherings with Friends and Family: Not on file  . Attends Religious Services: Not on file  . Active Member of Clubs or Organizations: Not on file  . Attends Archivist Meetings: Not on file  . Marital Status: Not on file    ECOG Status: 1 - Symptomatic but completely ambulatory   Vital Signs: BP (!) 155/77 (BP Location: Left Arm)   Pulse 68   Temp 98.7 F (37.1 C)   SpO2 98%   Physical Exam Constitutional: Oriented to person, place, and time. Well-developed and well-nourished. No distress.   HENT:  Head: Normocephalic and atraumatic.  Eyes: Conjunctivae and EOM are normal. Right eye exhibits no discharge. Left eye exhibits no discharge. No scleral icterus.  Neck: No JVD present.  Pulmonary/Chest: Effort normal. No stridor. No respiratory distress.  Abdomen: soft, non distended Neurological:  alert and oriented to person, place, and time.  Skin: Skin is warm and dry.  not diaphoretic.  Psychiatric:   normal mood and affect.   behavior is normal. Judgment and thought content normal.   :  Review of Systems   Review of Systems: A 12 point ROS discussed and pertinent positives are indicated in the HPI above.  All other systems are negative.   Imaging: CT ABDOMEN AND PELVIS WITH CONTRAST  TECHNIQUE: Multidetector CT imaging of the abdomen and pelvis was performed using the standard protocol following bolus administration of intravenous contrast.  CONTRAST: 119mL OMNIPAQUE IOHEXOL  300 MG/ML SOLN  COMPARISON: 07/03/2007, report only.  FINDINGS: Lower chest: Motion degradation at the lung bases. Grossly clear lung bases. Normal heart size with small, likely physiologic pericardial effusion. No pleural fluid.  Hepatobiliary: Inferior right hepatic lobe hypoattenuating 9 mm lesion is likely a cyst. Mild hepatomegaly versus a Riedel's lobe. 18.4 cm. Normal gallbladder, without biliary ductal dilatation.  Pancreas: Normal, without mass or ductal dilatation.  Spleen: Normal in size, without focal abnormality.  Adrenals/Urinary Tract: Normal adrenal glands. Bilateral extrarenal pelves. No hydronephrosis. Normal urinary bladder.  Stomach/Bowel: Normal stomach, without wall thickening. Normal colon and terminal ileum.  Diminutive appendix suspected including on 63/2. Normal small bowel.  Vascular/Lymphatic: Aortic atherosclerosis. No abdominopelvic adenopathy.  Reproductive: Retroverted uterus. Prominent gonadal veins. No adnexal mass.  Other: No significant free fluid.  Musculoskeletal: No acute osseous abnormality.  IMPRESSION: 1. No acute process in the abdomen or pelvis. No explanation for patient's symptoms. 2. Prominent gonadal veins, as can be seen with pelvic congestion syndrome.  Aortic Atherosclerosis (ICD10-I70.0).   Electronically Signed By: Abigail Miyamoto M.D. On: 10/17/2018 09:01    Labs:  CBC: Recent Labs    04/19/18 1305 09/30/18 1733  WBC 7.0 6.2  HGB 13.9 14.1  HCT 41.7 41.7  PLT 273 248    COAGS: No results for input(s): INR, APTT in the last 8760 hours.  BMP: Recent Labs    04/19/18 1305 09/30/18 1733  NA 144 134*  K 5.5* 4.0  CL 104 100  CO2 25 24  GLUCOSE 91 112*  BUN 17 16  CALCIUM 10.1 9.5  CREATININE 0.91 1.05*  GFRNONAA 64 53*  GFRAA 73 >60    LIVER FUNCTION TESTS: Recent Labs    04/19/18 1305 09/30/18 1733  BILITOT 0.5 1.5*  AST 14 20  ALT 9 15  ALKPHOS 56 40  PROT 6.8 6.7  ALBUMIN 4.4 4.5      TUMOR MARKERS: No results for input(s): AFPTM, CEA, CA199, CHROMGRNA in the last 8760 hours.  Assessment and Plan:  My Impression is that this patient's left low pelvic pain is nonspecific, with broad differential diagnosis.  Gynecologic and GI evaluation has been unrevealing.  GU evaluation for interstitial cystitis and pelvic for pathology is underway, indeterminate.  Given the anatomic derangement in the left ovarian vein with dilatation and obvious reflux to dilated parametrial vessels, pelvic congestion syndrome remains on the differential diagnosis.  We discussed classic pelvic congestion syndrome, anatomic findings, and treatment options.  We discussed venous embolization of the refluxing left ovarian vein to decrease the reflux into the dilated left parametrial venous plexus in hopes of alleviating symptoms.  We discussed details of the technique, moderate sedation, possible risks and complications including bleeding, infection, nerve organ damage, and incomplete symptom relief.  I think that if her GU evaluation is ultimately noncontributory, I would offer transvenous embolization of the refluxing left ovarian vein in hopes of alleviating symptoms of possible pelvic congestion syndrome.  Unfortunately the disease presentation is nonspecific and overlaps with other etiologies of pelvic pain, but her anatomic venous derangement as seen on CT is moderately advanced supporting the diagnosis.  The findings were discussed with the patient.  She asked appropriate questions which were answered.  She knows to call with any additional questions or concerns.  She will follow-up with [place] after her GU evaluation is completed if her symptoms persist and she wishes to proceed with left ovarian vein embolization.  At that point, we can set it up as an outpatient procedure under moderate sedation at the hospital.  Thank you for this interesting consult.  I greatly enjoyed meeting Brianna Harper and look  forward to participating in their care.  A copy of this report was sent to the requesting provider on this date.  Electronically Signed: Rickard Rhymes 04/12/2019, 2:22 PM   I spent a total of  40 Minutes   in face to face in clinical consultation, greater than 50% of which was counseling/coordinating care for left pelvic pain.

## 2019-04-28 ENCOUNTER — Ambulatory Visit: Payer: Medicare Other

## 2019-05-01 DIAGNOSIS — N393 Stress incontinence (female) (male): Secondary | ICD-10-CM | POA: Diagnosis not present

## 2019-05-01 DIAGNOSIS — R1030 Lower abdominal pain, unspecified: Secondary | ICD-10-CM | POA: Diagnosis not present

## 2019-05-01 DIAGNOSIS — R102 Pelvic and perineal pain: Secondary | ICD-10-CM | POA: Diagnosis not present

## 2019-05-01 DIAGNOSIS — R300 Dysuria: Secondary | ICD-10-CM | POA: Diagnosis not present

## 2019-05-01 DIAGNOSIS — M62838 Other muscle spasm: Secondary | ICD-10-CM | POA: Diagnosis not present

## 2019-05-01 DIAGNOSIS — M6289 Other specified disorders of muscle: Secondary | ICD-10-CM | POA: Diagnosis not present

## 2019-05-01 DIAGNOSIS — M6281 Muscle weakness (generalized): Secondary | ICD-10-CM | POA: Diagnosis not present

## 2019-05-02 ENCOUNTER — Ambulatory Visit: Payer: Medicare Other | Admitting: Neurology

## 2019-05-03 ENCOUNTER — Ambulatory Visit: Payer: Medicare Other

## 2019-05-09 ENCOUNTER — Ambulatory Visit: Payer: Medicare Other

## 2019-05-15 DIAGNOSIS — R519 Headache, unspecified: Secondary | ICD-10-CM | POA: Diagnosis not present

## 2019-06-13 DIAGNOSIS — D225 Melanocytic nevi of trunk: Secondary | ICD-10-CM | POA: Diagnosis not present

## 2019-06-13 DIAGNOSIS — D1801 Hemangioma of skin and subcutaneous tissue: Secondary | ICD-10-CM | POA: Diagnosis not present

## 2019-06-13 DIAGNOSIS — L570 Actinic keratosis: Secondary | ICD-10-CM | POA: Diagnosis not present

## 2019-06-13 DIAGNOSIS — L400 Psoriasis vulgaris: Secondary | ICD-10-CM | POA: Diagnosis not present

## 2019-06-13 DIAGNOSIS — L814 Other melanin hyperpigmentation: Secondary | ICD-10-CM | POA: Diagnosis not present

## 2019-06-13 DIAGNOSIS — I788 Other diseases of capillaries: Secondary | ICD-10-CM | POA: Diagnosis not present

## 2019-06-13 DIAGNOSIS — L718 Other rosacea: Secondary | ICD-10-CM | POA: Diagnosis not present

## 2019-06-13 DIAGNOSIS — L821 Other seborrheic keratosis: Secondary | ICD-10-CM | POA: Diagnosis not present

## 2019-06-19 DIAGNOSIS — M5412 Radiculopathy, cervical region: Secondary | ICD-10-CM | POA: Diagnosis not present

## 2019-06-19 DIAGNOSIS — R519 Headache, unspecified: Secondary | ICD-10-CM | POA: Diagnosis not present

## 2019-06-19 DIAGNOSIS — M25511 Pain in right shoulder: Secondary | ICD-10-CM | POA: Diagnosis not present

## 2019-06-19 DIAGNOSIS — M4722 Other spondylosis with radiculopathy, cervical region: Secondary | ICD-10-CM | POA: Diagnosis not present

## 2019-06-19 DIAGNOSIS — G8929 Other chronic pain: Secondary | ICD-10-CM | POA: Diagnosis not present

## 2019-07-24 DIAGNOSIS — G8929 Other chronic pain: Secondary | ICD-10-CM | POA: Diagnosis not present

## 2019-07-24 DIAGNOSIS — M25511 Pain in right shoulder: Secondary | ICD-10-CM | POA: Diagnosis not present

## 2019-07-24 DIAGNOSIS — M47812 Spondylosis without myelopathy or radiculopathy, cervical region: Secondary | ICD-10-CM | POA: Diagnosis not present

## 2019-08-07 ENCOUNTER — Telehealth: Payer: Self-pay | Admitting: Orthopaedic Surgery

## 2019-08-07 NOTE — Telephone Encounter (Signed)
Spoke with patient. Explained that x-rays would need to be on a disc in order for [place] to view them. I also explained that she would need to get those and bring them with her to tomorrow's appointment, otherwise we would have to repeat any x-rays needed for Dr.Whitfield to view. She understands and states that she cannot go to their office today and get them.

## 2019-08-07 NOTE — Telephone Encounter (Signed)
Patient called wanting to know if the providers use [employer] to send images.  Also [facility] advised her that if we did not use [employer] that we would have to send an authorization to request her x-rays.  [facility] fax is [phone], it needs to be marked urgent.  Patient's CB#[phone].  Thank you.

## 2019-08-08 ENCOUNTER — Ambulatory Visit (INDEPENDENT_AMBULATORY_CARE_PROVIDER_SITE_OTHER): Payer: Medicare Other | Admitting: Orthopaedic Surgery

## 2019-08-08 ENCOUNTER — Encounter: Payer: Self-pay | Admitting: Orthopaedic Surgery

## 2019-08-08 ENCOUNTER — Other Ambulatory Visit: Payer: Self-pay

## 2019-08-08 VITALS — Ht 66.0 in | Wt 130.0 lb

## 2019-08-08 DIAGNOSIS — M25511 Pain in right shoulder: Secondary | ICD-10-CM | POA: Diagnosis not present

## 2019-08-08 DIAGNOSIS — M542 Cervicalgia: Secondary | ICD-10-CM

## 2019-08-08 DIAGNOSIS — G8929 Other chronic pain: Secondary | ICD-10-CM

## 2019-08-08 DIAGNOSIS — M25519 Pain in unspecified shoulder: Secondary | ICD-10-CM | POA: Diagnosis not present

## 2019-08-08 NOTE — Progress Notes (Signed)
Office Visit Note   Patient: Brianna Harper           Date of Birth: 12/30/1945           MRN: [id number] Visit Date: 08/08/2019              Requested by: Prince Solian, MD [address] [place],  [place] [address] PCP: Prince Solian, MD   Assessment & Plan: Visit Diagnoses:  1. Chronic right shoulder pain   2. Neck and shoulder pain     Plan: Brianna Harper has a long and chronic history of neck and right shoulder pain which is outlined below.  After much discussion with Brianna Harper and her husband I am going to proceed with an MRI scan of her right shoulder.  I believe she probably has a multifactorial issue with her right upper extremity related to her shoulder and her neck.  Extended office visit regarding her history, potential diagnosis and treatment options  Follow-Up Instructions: Return After MRI scan right shoulder.   Orders:  Orders Placed This Encounter  Procedures  . MR SHOULDER RIGHT WO CONTRAST   No orders of the defined types were placed in this encounter.     Procedures: No procedures performed   Clinical Data: No additional findings.   Subjective: Chief Complaint  Patient presents with  . Right Shoulder - Pain  Patient presents today for her right shoulder pain. No known injury. Her shoulder has been hurting for two months. The pain is all throughout her shoulder and radiates into her upper arm. She has been going to [facility] for the last year and half for severe neck pain. She said that the pain radiates from her neck and into her shoulder and down her arm. She cannot raise her arm in the morning. She said that she can move her around enough to get dressed around 1pm. She states that her arm and shoulder aches constantly, but does get better as the day progresses. She is also going to [facility] for her right sided jaw and ear pain. No numbness or tingling. She states that her right arm is very weak. She takes Baclofen three times daily and occasional  tylenol. No previous shoulder or neck surgery. She has had x-rays taken, but was unable to get those on a disc for today's appointment. She goes to a pain clinic in [place] and had injections on 07/24/2019 in her neck, but states that they did not help.  Further history from Mr. and Brianna Harper.  Has been followed by Dr. Jannifer Franklin from [facility] neurology for chronic neck pain.  She was referred to the neurosurgeon at [facility](Dr [facility]) and subsequently a pain clinic at [facility].  (Dr.Buchheit) she has had a number of injections in her neck for apparent trigger point areas, multiple medicines and even physical therapy without much relief.  She had an MRI scan in January 2020 that demonstrated some arthritic changes in the mid cervical spine.  Within the past several months she has been experiencing pain in her right shoulder that may or may not be localized to the shoulder.  She is having some difficulty raising her arm overhead and sleeping on that side.  She also notes that her arm is almost "not usable" for the first number of hours in the morning and then it seems to "loosen up".  She denies any history of injury or trauma.  She has been having trouble now for least a year and a half without much  relief and she is just "frustrated".  Essentially she has had this problem for a chronic period of time without a specific diagnosis or relief.  She had recent films of her right shoulder performed at [facility] that were essentially negative for any obvious pathology.  HPI  Review of Systems   Objective: Vital Signs: Ht 5\' 6"  (1.676 m)   Wt 130 lb (59 kg)   BMI 20.98 kg/m   Physical Exam Constitutional:      Appearance: She is well-developed.  Eyes:     Pupils: Pupils are equal, round, and reactive to light.  Pulmonary:     Effort: Pulmonary effort is normal.  Skin:    General: Skin is warm and dry.  Neurological:     Mental Status: She is alert and oriented to person, place, and time.  Psychiatric:         Behavior: Behavior normal.     Ortho Exam awake alert and oriented x3.  Comfortable sitting.  Accompanied by her husband.  She had considerable loss of motion of her cervical spine.  She could barely touch her chin to her chest and had only about 50% of normal neck extension.  There was no referred pain to either upper extremity but she did have some pain referred to the lower cervical spine and interscapular region.  Painful overhead arc of motion with the right shoulder with positive impingement and minimally positive empty can testing.  Speeds sign was minimally positive.  Skin intact.  No particular discomfort at the [facility] joint or the subacromial region.  Good strength with internal and external rotation  Specialty Comments:  No specialty comments available.  Imaging: No results found.   PMFS History: Patient Active Problem List   Diagnosis Date Noted  . Cervicogenic headache 07/19/2018  . Laryngopharyngeal reflux (LPR) 06/18/2014  . Hypokalemia 06/18/2014  . Palpitations 06/18/2014  . Anxiety state 05/24/2014  . Atypical chest pain 04/22/2014  . Cough 04/09/2014  . Chest pain 03/14/2014  . Neck and shoulder pain 12/07/2013  . AC (acromioclavicular) arthritis 11/12/2013  . Acute onset of severe vertigo 09/20/2013  . Essential hypertension 09/20/2013  . Medication management 09/20/2013  . Subacromial bursitis 06/22/2013  . Pain in right shoulder 06/05/2013  . Arm pain, right 06/05/2013  . Acid reflux 05/04/2013  . Hyperglycemia 05/04/2013  . TGA (transient global amnesia) 04/25/2013  . Amnesia 04/15/2013  . Neck pain 03/09/2013  . Laryngitis from reflux of stomach acid- ? 01/01/2013  . Welcome to Medicare preventive visit 05/01/2012  . Leg cramp 05/01/2012  . Intermittent lightheadedness 04/03/2012  . Sinusitis 04/03/2012  . Visit for preventive health examination 07/18/2011  . Constipation 07/18/2011  . Hypertension 07/18/2011  . Urgency of urination 04/06/2011  . Sore  throat 06/11/2010  . History of recurrent UTIs 06/11/2010  . OSTEOPENIA 05/18/2009  . Urticaria 07/10/2007  . HYPERLIPIDEMIA 10/28/2006  . ANXIETY 10/28/2006  . DEPRESSION 10/28/2006  . Migraine 10/28/2006  . GERD 10/28/2006  . PMS 10/28/2006   Past Medical History:  Diagnosis Date  . Anxiety    no per pt  . 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 Cervicogenic headache 07/19/2018  . Depression   . GERD (gastroesophageal reflux disease)   . Hyperlipidemia   . Hypertension   . Laryngitis   . Migraine    "not very often anymore; they were more menopausal" (04/16/2013)  . Osteopenia    dexa -2.0 hip 2008  . Rapid heart  rate    hx of     Family History  Problem Relation Age of Onset  . Hypertension Mother   . Migraines Mother   . Arthritis Father   . Ovarian cancer Maternal Grandmother   . Seizures Neg Hx   . Esophageal cancer Neg Hx   . Colon cancer Neg Hx   . Pancreatic cancer Neg Hx   . Stomach cancer Neg Hx   . Liver disease Neg Hx   . Inflammatory bowel disease Neg Hx   . Rectal cancer Neg Hx   . Breast cancer Neg Hx     Past Surgical History:  Procedure Laterality Date  . COLONOSCOPY  02/03/2004   internal hemorrhoids  . IR RADIOLOGIST EVAL & MGMT  04/12/2019  . UPPER GASTROINTESTINAL ENDOSCOPY  08/29/2008   gastritis, hiatal hernia   Social History   Occupational History  . Occupation: retired  Tobacco Use  . Smoking status: Never Smoker  . Smokeless tobacco: Never Used  Substance and Sexual Activity  . Alcohol use: Not Currently    Alcohol/week: 0.0 standard drinks    Comment: extremely rare ; only on special occasion  . Drug use: No  . Sexual activity: Yes

## 2019-08-14 ENCOUNTER — Other Ambulatory Visit: Payer: Self-pay | Admitting: Internal Medicine

## 2019-08-14 DIAGNOSIS — Z1231 Encounter for screening mammogram for malignant neoplasm of breast: Secondary | ICD-10-CM

## 2019-09-05 ENCOUNTER — Ambulatory Visit: Payer: Medicare Other | Admitting: Neurology

## 2019-09-10 ENCOUNTER — Ambulatory Visit
Admission: RE | Admit: 2019-09-10 | Discharge: 2019-09-10 | Disposition: A | Discharge status: Home / Self Care | Payer: Medicare Other | Source: Ambulatory Visit | Attending: Orthopaedic Surgery | Admitting: Orthopaedic Surgery

## 2019-09-10 DIAGNOSIS — G8929 Other chronic pain: Secondary | ICD-10-CM

## 2019-09-10 DIAGNOSIS — M25511 Pain in right shoulder: Secondary | ICD-10-CM

## 2019-09-11 ENCOUNTER — Ambulatory Visit (INDEPENDENT_AMBULATORY_CARE_PROVIDER_SITE_OTHER): Payer: Medicare Other | Admitting: Orthopaedic Surgery

## 2019-09-11 ENCOUNTER — Encounter: Payer: Self-pay | Admitting: Orthopaedic Surgery

## 2019-09-11 ENCOUNTER — Other Ambulatory Visit: Payer: Self-pay

## 2019-09-11 VITALS — Ht 66.0 in | Wt 130.0 lb

## 2019-09-11 DIAGNOSIS — G8929 Other chronic pain: Secondary | ICD-10-CM | POA: Diagnosis not present

## 2019-09-11 DIAGNOSIS — M19011 Primary osteoarthritis, right shoulder: Secondary | ICD-10-CM | POA: Diagnosis not present

## 2019-09-11 DIAGNOSIS — M25511 Pain in right shoulder: Secondary | ICD-10-CM | POA: Diagnosis not present

## 2019-09-11 DIAGNOSIS — M7551 Bursitis of right shoulder: Secondary | ICD-10-CM | POA: Diagnosis not present

## 2019-09-11 DIAGNOSIS — M7541 Impingement syndrome of right shoulder: Secondary | ICD-10-CM | POA: Diagnosis not present

## 2019-09-11 NOTE — Progress Notes (Signed)
Office Visit Note   Patient: Brianna Harper           Date of Birth: Aug 18, 1945           MRN: [id number] Visit Date: 09/11/2019              Requested by: Prince Solian, MD [address] [place],  [place] [address] PCP: Prince Solian, MD   Assessment & Plan: Visit Diagnoses:  1. Chronic right shoulder pain   2. Impingement syndrome of right shoulder   3. Arthritis of right acromioclavicular joint   4. Subacromial bursitis of right shoulder joint   5.      At this time she is not interested in surgery on the right shoulder  Plan:  #1: Physical therapy outpatient here at this office #2: We discussed about Voltaren gel also as an anti-inflammatory #3: We also discussed that if necessary possible subacromial decompression and distal clavicle excision may be of benefit if this all fails. We will see how he does with the therapy if beneficial then no further evaluation is necessary if not then we will see her back and discuss surgery.  Follow-Up Instructions: No follow-ups on file.  As needed.  Orders:  Orders Placed This Encounter  Procedures  . Ambulatory referral to Physical Therapy   No orders of the defined types were placed in this encounter.     Procedures: No procedures performed   Clinical Data: No additional findings.   Subjective: Chief Complaint  Patient presents with  . Right Shoulder - Follow-up    MRI review    HPI Patient presents today for follow up on her right shoulder. She had an MRI of her shoulder on 09-10-2019. She states that there have been no changes. She has soreness that radiates into her arm, along with weakness that comes and goes.  She continues to have symptoms in the right shoulder which and just proximal to the elbow.  It severely causes more pain if she internally rotates the arm at the shoulder and tries to use it.  She also has some effusion.  There was some joint pain extension flexes 30 degrees in her cervical spine.  Forward flex near chin to chest 45 degrees right and left rotation.       Review of Systems  Constitutional: Negative for fatigue.  HENT: Negative for ear pain.   Eyes: Negative for pain.  Respiratory: Negative for shortness of breath.   Cardiovascular: Negative for leg swelling.  Gastrointestinal: Negative for constipation and diarrhea.  Endocrine: Negative for cold intolerance and heat intolerance.  Genitourinary: Negative for difficulty urinating.  Musculoskeletal: Negative for joint swelling.  Skin: Negative for rash.  Allergic/Immunologic: Negative for food allergies.  Neurological: Positive for weakness.  Hematological: Does not bruise/bleed easily.  Psychiatric/Behavioral: Negative for sleep disturbance.     Objective: Vital Signs: Ht 5\' 6"  (1.676 m)   Wt 130 lb (59 kg)   BMI 20.98 kg/m   Physical Exam Constitutional:      Appearance: Normal appearance. She is well-developed and normal weight.  HENT:     Head: Normocephalic.  Eyes:     Pupils: Pupils are equal, round, and reactive to light.  Pulmonary:     Effort: Pulmonary effort is normal.  Skin:    General: Skin is warm and dry.  Neurological:     General: No focal deficit present.     Mental Status: She is alert and oriented to person, place, and time.  Psychiatric:        Mood and Affect: Mood normal.        Behavior: Behavior normal.        Thought Content: Thought content normal.        Judgment: Judgment normal.     Ortho Exam  It causes more pain if she internally rotates the arm at the shoulder and tries to use it. Basically positive empty can test.  There was some joint pain with cervical extension  30 degrees in her cervical spine.  Forward flex near chin to chest. 45 degrees right and left rotation.cervical spine.       Specialty Comments:  No specialty comments available.  Imaging: MR SHOULDER RIGHT WO CONTRAST  Result Date: 09/11/2019 CLINICAL DATA:  Right shoulder pain and  weakness for the past 2 months. No injury or prior surgery. EXAM: MRI OF THE RIGHT SHOULDER WITHOUT CONTRAST TECHNIQUE: Multiplanar, multisequence MR imaging of the shoulder was performed. No intravenous contrast was administered. COMPARISON:  MRI right shoulder dated January 13, 2014. FINDINGS: Rotator cuff: Progressive now moderate supraspinatus tendinosis with unchanged bursal surface fraying of the mid to distal tendon. The infraspinatus, teres minor, and subscapularis tendons are unremarkable. Muscles: No atrophy or abnormal signal of the muscles of the rotator cuff. Biceps long head:  Intact and normally positioned. Acromioclavicular Joint: Minimal arthropathy of the acromioclavicular joint. Type II acromion. Small amount of fluid in the subacromial/subdeltoid bursa. Glenohumeral Joint: No joint effusion. No chondral defect. Labrum: Grossly intact, but evaluation is limited by lack of intraarticular fluid. Bones:  No marrow abnormality, fracture or dislocation. Other: None. IMPRESSION: 1. Progressive now moderate supraspinatus tendinosis with unchanged bursal surface fraying of the mid to distal tendon. No high-grade rotator cuff tear. 2. Mild subacromial/subdeltoid bursitis. Electronically Signed   By: Titus Dubin M.D.   On: 09/11/2019 08:48     PMFS History: Patient Active Problem List   Diagnosis Date Noted  . Impingement syndrome of right shoulder 09/11/2019  . Cervicogenic headache 07/19/2018  . Laryngopharyngeal reflux (LPR) 06/18/2014  . Hypokalemia 06/18/2014  . Palpitations 06/18/2014  . Anxiety state 05/24/2014  . Atypical chest pain 04/22/2014  . Cough 04/09/2014  . Chest pain 03/14/2014  . Neck and shoulder pain 12/07/2013  . AC (acromioclavicular) arthritis 11/12/2013  . Acute onset of severe vertigo 09/20/2013  . Essential hypertension 09/20/2013  . Medication management 09/20/2013  . Subacromial bursitis 06/22/2013  . Pain in right shoulder 06/05/2013  . Arm pain,  right 06/05/2013  . Acid reflux 05/04/2013  . Hyperglycemia 05/04/2013  . TGA (transient global amnesia) 04/25/2013  . Amnesia 04/15/2013  . Neck pain 03/09/2013  . Laryngitis from reflux of stomach acid- ? 01/01/2013  . Welcome to Medicare preventive visit 05/01/2012  . Leg cramp 05/01/2012  . Intermittent lightheadedness 04/03/2012  . Sinusitis 04/03/2012  . Visit for preventive health examination 07/18/2011  . Constipation 07/18/2011  . Hypertension 07/18/2011  . Urgency of urination 04/06/2011  . Sore throat 06/11/2010  . History of recurrent UTIs 06/11/2010  . OSTEOPENIA 05/18/2009  . Urticaria 07/10/2007  . HYPERLIPIDEMIA 10/28/2006  . ANXIETY 10/28/2006  . DEPRESSION 10/28/2006  . Migraine 10/28/2006  . GERD 10/28/2006  . PMS 10/28/2006   Past Medical History:  Diagnosis Date  . Anxiety    no per pt  . 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 Cervicogenic headache 07/19/2018  . Depression   . GERD (gastroesophageal reflux disease)   .  Hyperlipidemia   . Hypertension   . Laryngitis   . Migraine    "not very often anymore; they were more menopausal" (04/16/2013)  . Osteopenia    dexa -2.0 hip 2008  . Rapid heart rate    hx of     Family History  Problem Relation Age of Onset  . Hypertension Mother   . Migraines Mother   . Arthritis Father   . Ovarian cancer Maternal Grandmother   . Seizures Neg Hx   . Esophageal cancer Neg Hx   . Colon cancer Neg Hx   . Pancreatic cancer Neg Hx   . Stomach cancer Neg Hx   . Liver disease Neg Hx   . Inflammatory bowel disease Neg Hx   . Rectal cancer Neg Hx   . Breast cancer Neg Hx     Past Surgical History:  Procedure Laterality Date  . COLONOSCOPY  02/03/2004   internal hemorrhoids  . IR RADIOLOGIST EVAL & MGMT  04/12/2019  . UPPER GASTROINTESTINAL ENDOSCOPY  08/29/2008   gastritis, hiatal hernia   Social History   Occupational History  . Occupation: retired  Tobacco Use  . Smoking status: Never Smoker  .  Smokeless tobacco: Never Used  Substance and Sexual Activity  . Alcohol use: Not Currently    Alcohol/week: 0.0 standard drinks    Comment: extremely rare ; only on special occasion  . Drug use: No  . Sexual activity: Yes

## 2019-10-02 ENCOUNTER — Other Ambulatory Visit: Payer: Self-pay

## 2019-10-02 ENCOUNTER — Ambulatory Visit
Admission: RE | Admit: 2019-10-02 | Discharge: 2019-10-02 | Disposition: A | Discharge status: Home / Self Care | Payer: Medicare Other | Source: Ambulatory Visit | Attending: Internal Medicine | Admitting: Internal Medicine

## 2019-10-02 DIAGNOSIS — Z1231 Encounter for screening mammogram for malignant neoplasm of breast: Secondary | ICD-10-CM

## 2019-10-16 ENCOUNTER — Ambulatory Visit: Payer: Medicare Other | Attending: Orthopaedic Surgery | Admitting: Physical Therapy

## 2019-10-16 ENCOUNTER — Other Ambulatory Visit: Payer: Self-pay

## 2019-10-16 DIAGNOSIS — M25511 Pain in right shoulder: Secondary | ICD-10-CM | POA: Insufficient documentation

## 2019-10-16 DIAGNOSIS — M25611 Stiffness of right shoulder, not elsewhere classified: Secondary | ICD-10-CM | POA: Insufficient documentation

## 2019-10-16 DIAGNOSIS — G8929 Other chronic pain: Secondary | ICD-10-CM | POA: Diagnosis not present

## 2019-10-16 DIAGNOSIS — M6281 Muscle weakness (generalized): Secondary | ICD-10-CM

## 2019-10-16 NOTE — Therapy (Signed)
[facility] [address] [place], [place], [address] Phone: [phone]   Fax:  [phone]  Physical Therapy Evaluation  Patient Details  Name: Brianna Harper MRN: [id number] Date of Birth: 09/28/1945 Referring Provider (PT): Dr. Durward Fortes   Encounter Date: 10/16/2019   PT End of Session - 10/16/19 1746    Visit Number 1    Date for PT Re-Evaluation 01/08/20    Authorization Type Medicare BCBS    PT Start Time 6387    PT Stop Time 1528    PT Time Calculation (min) 43 min    Activity Tolerance Patient tolerated treatment well           Past Medical History:  Diagnosis Date  . Anxiety    no per pt  . 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 Cervicogenic headache 07/19/2018  . Depression   . GERD (gastroesophageal reflux disease)   . Hyperlipidemia   . Hypertension   . Laryngitis   . Migraine    "not very often anymore; they were more menopausal" (04/16/2013)  . Osteopenia    dexa -2.0 hip 2008  . Rapid heart rate    hx of     Past Surgical History:  Procedure Laterality Date  . COLONOSCOPY  02/03/2004   internal hemorrhoids  . IR RADIOLOGIST EVAL & MGMT  04/12/2019  . UPPER GASTROINTESTINAL ENDOSCOPY  08/29/2008   gastritis, hiatal hernia    There were no vitals filed for this visit.    Subjective Assessment - 10/16/19 1447    Subjective 2019 neck pain but has improved recently;  in January right shoulder pain started;  radiates to elbow; worse in the AM;  right hand dominant; difficulty sleeping especially sidelying    Pertinent History had PT in 2015 at [facility]    Limitations Lifting;House hold activities    Diagnostic tests MRI supraspinatus tendonosis; subacromial bursitis    Patient Stated Goals no pain; use arm for daily tasks    Currently in Pain? Yes    Pain Score 7     Pain Location Shoulder    Pain Orientation Right    Pain Type Chronic pain    Aggravating Factors  AM;  elevation/reach overhead; avoids behind the back; sidelying    Pain Relieving Factors better as the day goes on              [facility] PT Assessment - 10/16/19 0001      Assessment   Medical Diagnosis chronic right shoulder pain     Referring Provider (PT) Dr. Durward Fortes    Onset Date/Surgical Date --   January    Hand Dominance Right    Next MD Visit as needed    Prior Therapy at [facility] in 2015      Precautions   Precautions None      Restrictions   Weight Bearing Restrictions No      Balance Screen   Has the patient fallen in the past 6 months No    Has the patient had a decrease in activity level because of a fear of falling?  No    Is the patient reluctant to leave their home because of a fear of falling?  No      Home Ecologist residence    Living Arrangements Spouse/significant other      Prior Function   Level of [place] Retired  Leisure I like to heavy clean; walk; be with grandchildren; travel       Observation/Other Assessments   Focus on Therapeutic Outcomes (FOTO)  50% limitation       Posture/Postural Control   Posture/Postural Control Postural limitations    Postural Limitations Rounded Shoulders;Forward head      AROM   Overall AROM Comments cervical ROM very limited 75% extension, sidebending pt states this is "baseline"  reports shoulder ROM causes neck pain     Right Shoulder Flexion 147 Degrees    Right Shoulder ABduction 156 Degrees   pain   Right Shoulder Internal Rotation --   T10   Right Shoulder External Rotation 90 Degrees    Left Shoulder Flexion 153 Degrees    Left Shoulder ABduction 166 Degrees    Left Shoulder Internal Rotation --   T6   Left Shoulder External Rotation 83 Degrees      Strength   Overall Strength Comments right periscapular muscles 4/5     Right Shoulder Flexion 4/5    Right Shoulder Extension 4/5    Right Shoulder ABduction 4/5    Right Shoulder Internal  Rotation 4/5    Right Shoulder External Rotation 4/5      Palpation   Palpation comment good scapular mobility; tender points in right upper trap, right cervical paraspinals; subacromial tenderness       Spurling's   Findings Negative      other    Side Right    Comment + upper limb tension test       Hawkins-Kennedy test   Findings Negative      Empty Can test   Findings Negative      Lag time at 0 degrees   Findings Negative      Drop Arm test   Comment pain with resisted abduction at 90 degrees                       Objective measurements completed on examination: See above findings.               PT Education - 10/16/19 1524    Education Details Access Code: [email or web address]: [email or web address]: 07/20/2021Prepared by: Marzetta Board SimpsonExercisesSupine Shoulder Flexion AAROM with Dowel - 1 x daily - 7 x weekly - 1 sets - 10 repsSupine Shoulder Flexion AAROM - 1 x daily - 7 x weekly - 1 sets - 10 repsSeated Scapular Retraction - 1 x daily - 7 x weekly - 1 sets - 10 reps            PT Short Term Goals - 10/16/19 1801      PT SHORT TERM GOAL #1   Title The patient will demonstrate knowledge of basic HEP for ROM and initial strengthening    Time 6    Period Weeks    Status New    Target Date 11/27/19      PT SHORT TERM GOAL #2   Title The patient will report a 30% reduction in right shoulder pain with reaching overhead and with morning discomfort    Time 6    Period Weeks    Status New      PT SHORT TERM GOAL #3   Title Right shoulder flexion to 153 degrees and abduction to 160 needed for reaching shelves overhead    Time 6    Period Weeks    Status New             PT  Long Term Goals - 10/16/19 1804      PT LONG TERM GOAL #1   Title The patient will be independent in safe self progression of HEP    Time 12    Period Weeks    Status New    Target Date 01/08/20      PT LONG TERM GOAL #2   Title The patient will  report a 60% reduction in right shoulder pain in the morningtime and with reaching overhead    Time 12    Period Weeks    Status New      PT LONG TERM GOAL #3   Title The patient will have 4+/5 glenohumeral and scapular strength needed for lifting items on/off the shelves in the fridge    Time 12    Period Weeks    Status New      PT LONG TERM GOAL #4   Title FOTO functional outcome score improved from 50% to 41% indicating improved function with less pain    Time 12    Period Weeks    Status New                  Plan - 10/16/19 1738    Clinical Impression Statement The patient reports a long history of neck pain but began having right shoulder pain in January.  Symptoms can radiate into her elbow and wrist as well.  Her shoulder pain is aggravated with reaching overhead, in the mornings and with right sidelying.  She has mild reduction in shoulder ROM compared to her left shoulder:  flexion 147 degrees, abduction 156 degrees.  Her cervical ROM is quite limited in extension and sidebending which the patient reports is typical for her.  She reports all the movements on exam makes her neck hurt more.  +Upper limb tension test.  Negative rotator cuff tests except pain with resisted abduction. Mild tenderness in subacromial region. Glenohumeral and scapular strength grossly 4/5.   Her FOTO score indicates a 50% self perceived limitation.  She would benefit from PT to address these limitations.    Personal Factors and Comorbidities Age;Comorbidity 1;Comorbidity 2    Comorbidities osteopenia; history of chronic neck pain; previous history of right shoulder pain    Examination-Activity Limitations Reach Overhead;Carry;Sleep;Hygiene/Grooming;Lift    Examination-Participation Restrictions Other    Stability/Clinical Decision Making Stable/Uncomplicated    Clinical Decision Making Low    Rehab Potential Good    PT Frequency 1x / week    PT Duration 12 weeks    PT Treatment/Interventions  ADLs/Self Care Home Management;Electrical Stimulation;Cryotherapy;Moist Heat;Iontophoresis 4mg /ml Dexamethasone;Ultrasound;Therapeutic exercise;Therapeutic activities;Patient/family education;Manual techniques;Dry needling;Taping    PT Next Visit Plan ionto if cert signed; UE  neural gliding; DN to right upper trap, right cervical muscles;  periscapular muscles; add band rows and shoulder extensions to HEP    PT Home Exercise Plan [email or web address]           Patient will benefit from skilled therapeutic intervention in order to improve the following deficits and impairments:  Decreased range of motion, Increased fascial restricitons, Impaired UE functional use, Pain, Decreased strength  Visit Diagnosis: Chronic right shoulder pain - Plan: PT plan of care cert/re-cert  Stiffness of right shoulder, not elsewhere classified - Plan: PT plan of care cert/re-cert  Muscle weakness (generalized) - Plan: PT plan of care cert/re-cert     Problem List Patient Active Problem List   Diagnosis Date Noted  . Impingement syndrome of right shoulder 09/11/2019  . Cervicogenic  headache 07/19/2018  . Laryngopharyngeal reflux (LPR) 06/18/2014  . Hypokalemia 06/18/2014  . Palpitations 06/18/2014  . Anxiety state 05/24/2014  . Atypical chest pain 04/22/2014  . Cough 04/09/2014  . Chest pain 03/14/2014  . Neck and shoulder pain 12/07/2013  . AC (acromioclavicular) arthritis 11/12/2013  . Acute onset of severe vertigo 09/20/2013  . Essential hypertension 09/20/2013  . Medication management 09/20/2013  . Subacromial bursitis 06/22/2013  . Pain in right shoulder 06/05/2013  . Arm pain, right 06/05/2013  . Acid reflux 05/04/2013  . Hyperglycemia 05/04/2013  . TGA (transient global amnesia) 04/25/2013  . Amnesia 04/15/2013  . Neck pain 03/09/2013  . Laryngitis from reflux of stomach acid- ? 01/01/2013  . Welcome to Medicare preventive visit 05/01/2012  . Leg cramp 05/01/2012  . Intermittent lightheadedness  04/03/2012  . Sinusitis 04/03/2012  . Visit for preventive health examination 07/18/2011  . Constipation 07/18/2011  . Hypertension 07/18/2011  . Urgency of urination 04/06/2011  . Sore throat 06/11/2010  . History of recurrent UTIs 06/11/2010  . OSTEOPENIA 05/18/2009  . Urticaria 07/10/2007  . HYPERLIPIDEMIA 10/28/2006  . ANXIETY 10/28/2006  . DEPRESSION 10/28/2006  . Migraine 10/28/2006  . GERD 10/28/2006  . PMS 10/28/2006   Ruben Im, PT 10/16/19 6:09 PM Phone: [phone] Fax: [phone] Alvera Singh 10/16/2019, 6:08 PM  [facility] [address] [place], [place], [address] Phone: [phone]   Fax:  [phone]  Name: Brianna Harper MRN: [id number] Date of Birth: Nov 22, 1945

## 2019-10-16 NOTE — Patient Instructions (Signed)
Access Code: [email or web address] URL: [email or web address] Date: 10/16/2019 Prepared by: Ruben Im  Exercises Supine Shoulder Flexion AAROM with Dowel - 1 x daily - 7 x weekly - 1 sets - 10 reps Supine Shoulder Flexion AAROM - 1 x daily - 7 x weekly - 1 sets - 10 reps Seated Scapular Retraction - 1 x daily - 7 x weekly - 1 sets - 10 reps

## 2019-10-30 ENCOUNTER — Encounter: Payer: Self-pay | Admitting: Physical Therapy

## 2019-10-30 ENCOUNTER — Ambulatory Visit: Payer: Medicare Other | Attending: Orthopaedic Surgery | Admitting: Physical Therapy

## 2019-10-30 ENCOUNTER — Other Ambulatory Visit: Payer: Self-pay

## 2019-10-30 DIAGNOSIS — G8929 Other chronic pain: Secondary | ICD-10-CM

## 2019-10-30 DIAGNOSIS — M25611 Stiffness of right shoulder, not elsewhere classified: Secondary | ICD-10-CM

## 2019-10-30 DIAGNOSIS — M25511 Pain in right shoulder: Secondary | ICD-10-CM | POA: Diagnosis not present

## 2019-10-30 DIAGNOSIS — M6281 Muscle weakness (generalized): Secondary | ICD-10-CM | POA: Diagnosis not present

## 2019-10-30 NOTE — Therapy (Signed)
[facility] [address], [place], [place], [address] Phone: [phone]   Fax:  [phone]  Physical Therapy Treatment  Patient Details  Name: Brianna Harper MRN: [id number] Date of Birth: May 08, 1945 Referring Provider (PT): Dr. Durward Fortes   Encounter Date: 10/30/2019   PT End of Session - 10/30/19 1457    Visit Number 2    Date for PT Re-Evaluation 01/08/20    Authorization Type Medicare BCBS    PT Start Time 1057    PT Stop Time 1144    PT Time Calculation (min) 47 min    Activity Tolerance Patient tolerated treatment well           Past Medical History:  Diagnosis Date  . Anxiety    no per pt  . 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 Cervicogenic headache 07/19/2018  . Depression   . GERD (gastroesophageal reflux disease)   . Hyperlipidemia   . Hypertension   . Laryngitis   . Migraine    "not very often anymore; they were more menopausal" (04/16/2013)  . Osteopenia    dexa -2.0 hip 2008  . Rapid heart rate    hx of     Past Surgical History:  Procedure Laterality Date  . COLONOSCOPY  02/03/2004   internal hemorrhoids  . IR RADIOLOGIST EVAL & MGMT  04/12/2019  . UPPER GASTROINTESTINAL ENDOSCOPY  08/29/2008   gastritis, hiatal hernia    There were no vitals filed for this visit.   Subjective Assessment - 10/30/19 1058    Subjective I'm the same.  There are some mornings the exercises are just too painful to do.    Pertinent History had PT in 2015 at [facility]    Diagnostic tests MRI supraspinatus tendonosis; subacromial bursitis    Patient Stated Goals no pain; use arm for daily tasks    Currently in Pain? Yes    Pain Score 7     Pain Location Shoulder    Pain Orientation Right                             [facility] Adult PT Treatment/Exercise - 10/30/19 0001      Shoulder Exercises: Supine   Other Supine Exercises review of dowel assisted shoulder elevation modified to  vertical hold with thumb up 10x       Moist Heat Therapy   Number Minutes Moist Heat 3 Minutes      Iontophoresis   Type of Iontophoresis Dexamethasone    Location superior shoulder    Dose 4 ma/ml     Time 4-6 hour patch       Manual Therapy   Joint Mobilization glenohumeral distraction and inferior mobs with movement 15x     Soft tissue mobilization upper trap, cervical paraspinals, infraspinatus, supraspinatus     Scapular Mobilization depression, medial/lateral glides       Neck Exercises: Stretches   Upper Trapezius Stretch Left;3 reps;30 seconds            Trigger Point Dry Needling - 10/30/19 0001    Consent Given? Yes    Education Handout Provided Yes    Muscles Treated Head and Neck Upper trapezius;Cervical multifidi    Muscles Treated Upper Quadrant Supraspinatus;Infraspinatus    Upper Trapezius Response Twitch reponse elicited;Palpable increased muscle length    Cervical multifidi Response Palpable increased muscle length    Supraspinatus Response Palpable increased  muscle length    Infraspinatus Response Palpable increased muscle length                PT Education - 10/30/19 1452    Education Details upper trap stretch;  DN after care ; ionto info   Person(s) Educated Patient    Methods Explanation;Demonstration;Handout    Comprehension Returned demonstration;Verbalized understanding            PT Short Term Goals - 10/16/19 1801      PT SHORT TERM GOAL #1   Title The patient will demonstrate knowledge of basic HEP for ROM and initial strengthening    Time 6    Period Weeks    Status New    Target Date 11/27/19      PT SHORT TERM GOAL #2   Title The patient will report a 30% reduction in right shoulder pain with reaching overhead and with morning discomfort    Time 6    Period Weeks    Status New      PT SHORT TERM GOAL #3   Title Right shoulder flexion to 153 degrees and abduction to 160 needed for reaching shelves overhead    Time 6     Period Weeks    Status New             PT Long Term Goals - 10/16/19 1804      PT LONG TERM GOAL #1   Title The patient will be independent in safe self progression of HEP    Time 12    Period Weeks    Status New    Target Date 01/08/20      PT LONG TERM GOAL #2   Title The patient will report a 60% reduction in right shoulder pain in the morningtime and with reaching overhead    Time 12    Period Weeks    Status New      PT LONG TERM GOAL #3   Title The patient will have 4+/5 glenohumeral and scapular strength needed for lifting items on/off the shelves in the fridge    Time 12    Period Weeks    Status New      PT LONG TERM GOAL #4   Title FOTO functional outcome score improved from 50% to 41% indicating improved function with less pain    Time 12    Period Weeks    Status New                 Plan - 10/30/19 1106    Clinical Impression Statement The patient has multiple tender points in right shoulder and cervical musculature.  She has a positive response to initial Dn with improved shoulder ROM and decreased tender point size and number.  She is able to demonstrate improved shoulder assisted ROM following Dn and manual therapy but modified ex with vertical dowel hold and thumb up position.  Therapist monitoring response with all interventions.    Comorbidities osteopenia; history of chronic neck pain; previous history of right shoulder pain    Examination-Activity Limitations Reach Overhead;Carry;Sleep;Hygiene/Grooming;Lift    Rehab Potential Good    PT Frequency 1x / week    PT Duration 12 weeks    PT Treatment/Interventions ADLs/Self Care Home Management;Electrical Stimulation;Cryotherapy;Moist Heat;Iontophoresis 4mg /ml Dexamethasone;Ultrasound;Therapeutic exercise;Therapeutic activities;Patient/family education;Manual techniques;Dry needling;Taping    PT Next Visit Plan assess response to DN #1 and ionto #1;  add band rows and shoulder extensions to HEP;   recheck cervical and  shoulder ROM    PT Home Exercise Plan [email or web address]           Patient will benefit from skilled therapeutic intervention in order to improve the following deficits and impairments:  Decreased range of motion, Increased fascial restricitons, Impaired UE functional use, Pain, Decreased strength  Visit Diagnosis: Chronic right shoulder pain  Stiffness of right shoulder, not elsewhere classified  Muscle weakness (generalized)     Problem List Patient Active Problem List   Diagnosis Date Noted  . Impingement syndrome of right shoulder 09/11/2019  . Cervicogenic headache 07/19/2018  . Laryngopharyngeal reflux (LPR) 06/18/2014  . Hypokalemia 06/18/2014  . Palpitations 06/18/2014  . Anxiety state 05/24/2014  . Atypical chest pain 04/22/2014  . Cough 04/09/2014  . Chest pain 03/14/2014  . Neck and shoulder pain 12/07/2013  . AC (acromioclavicular) arthritis 11/12/2013  . Acute onset of severe vertigo 09/20/2013  . Essential hypertension 09/20/2013  . Medication management 09/20/2013  . Subacromial bursitis 06/22/2013  . Pain in right shoulder 06/05/2013  . Arm pain, right 06/05/2013  . Acid reflux 05/04/2013  . Hyperglycemia 05/04/2013  . TGA (transient global amnesia) 04/25/2013  . Amnesia 04/15/2013  . Neck pain 03/09/2013  . Laryngitis from reflux of stomach acid- ? 01/01/2013  . Welcome to Medicare preventive visit 05/01/2012  . Leg cramp 05/01/2012  . Intermittent lightheadedness 04/03/2012  . Sinusitis 04/03/2012  . Visit for preventive health examination 07/18/2011  . Constipation 07/18/2011  . Hypertension 07/18/2011  . Urgency of urination 04/06/2011  . Sore throat 06/11/2010  . History of recurrent UTIs 06/11/2010  . OSTEOPENIA 05/18/2009  . Urticaria 07/10/2007  . HYPERLIPIDEMIA 10/28/2006  . ANXIETY 10/28/2006  . DEPRESSION 10/28/2006  . Migraine 10/28/2006  . GERD 10/28/2006  . PMS 10/28/2006   Ruben Im, PT 10/30/19 4:57  PM Phone: [phone] Fax: [phone] Alvera Singh 10/30/2019, 4:56 PM  [facility] [address], [place], [place], [address] Phone: [phone]   Fax:  [phone]  Name: Brianna Harper MRN: [id number] Date of Birth: [birth year]

## 2019-10-30 NOTE — Patient Instructions (Signed)
Access Code: [email or web address] URL: [email or web address] Date: 10/30/2019 Prepared by: Ruben Im  Exercises Supine Shoulder Flexion AAROM with Dowel - 1 x daily - 7 x weekly - 1 sets - 10 reps Supine Shoulder Flexion AAROM - 1 x daily - 7 x weekly - 1 sets - 10 reps Seated Scapular Retraction - 1 x daily - 7 x weekly - 1 sets - 10 reps Seated Upper Trapezius Stretch - 1 x daily - 7 x weekly - 1 sets - 3 reps - 30 hold   Trigger Point Dry Needling  . What is Trigger Point Dry Needling (DN)? o DN is a physical therapy technique used to treat muscle pain and dysfunction. Specifically, DN helps deactivate muscle trigger points (muscle knots).  o A thin filiform needle is used to penetrate the skin and stimulate the underlying trigger point. The goal is for a local twitch response (LTR) to occur and for the trigger point to relax. No medication of any kind is injected during the procedure.   . What Does Trigger Point Dry Needling Feel Like?  o The procedure feels different for each individual patient. Some patients report that they do not actually feel the needle enter the skin and overall the process is not painful. Very mild bleeding may occur. However, many patients feel a deep cramping in the muscle in which the needle was inserted. This is the local twitch response.   Marland Kitchen How Will I feel after the treatment? o Soreness is normal, and the onset of soreness may not occur for a few hours. Typically this soreness does not last longer than two days.  o Bruising is uncommon, however; ice can be used to decrease any possible bruising.  o In rare cases feeling tired or nauseous after the treatment is normal. In addition, your symptoms may get worse before they get better, this period will typically not last longer than 24 hours.   . What Can I do After My Treatment? o Increase your hydration by drinking more water for the next 24 hours. o You may place ice or heat on the areas treated  that have become sore, however, do not use heat on inflamed or bruised areas. Heat often brings more relief post needling. o You can continue your regular activities, but vigorous activity is not recommended initially after the treatment for 24 hours. o DN is best combined with other physical therapy such as strengthening, stretching, and other therapies.    IONTOPHORESIS PATIENT PRECAUTIONS & CONTRAINDICATIONS:  . Redness under one or both electrodes can occur.  This characterized by a uniform redness that usually disappears within 12 hours of treatment. . Small pinhead size blisters may result in response to the drug.  Contact your physician if the problem persists more than 24 hours. . On rare occasions, iontophoresis therapy can result in temporary skin reactions such as rash, inflammation, irritation or burns.  The skin reactions may be the result of individual sensitivity to the ionic solution used, the condition of the skin at the start of treatment, reaction to the materials in the electrodes, allergies or sensitivity to dexamethasone, or a poor connection between the patch and your skin.  Discontinue using iontophoresis if you have any of these reactions and report to your therapist. . Remove the Patch or electrodes if you have any undue sensation of pain or burning during the treatment and report discomfort to your therapist. . Tell your Therapist if you have had known adverse reactions to the application of  electrical current. . If using the Patch, the LED light will turn off when treatment is complete and the patch can be removed.  Approximate treatment time is 1-3 hours.  Remove the patch when light goes off or after 6 hours. . The Patch can be worn during normal activity, however excessive motion where the electrodes have been placed can cause poor contact between the skin and the electrode or uneven electrical current resulting in greater risk of skin irritation. Marland Kitchen Keep out of the  reach of children.   . DO NOT use if you have a cardiac pacemaker or any other electrically sensitive implanted device. . DO NOT use if you have a known sensitivity to dexamethasone. . DO NOT use during Magnetic Resonance Imaging (MRI). . DO NOT use over broken or compromised skin (e.g. sunburn, cuts, or acne) due to the increased risk of skin reaction. . DO NOT SHAVE over the area to be treated:  To establish good contact between the Patch and the skin, excessive hair may be clipped. . DO NOT place the Patch or electrodes on or over your eyes, directly over your heart, or brain. . DO NOT reuse the Patch or electrodes as this may cause burns to occur.   Ruben Im PT [facility] [address] [place], [place] [address] Phone # [phone] Fax [phone]

## 2019-11-13 ENCOUNTER — Ambulatory Visit: Payer: Medicare Other | Admitting: Physical Therapy

## 2019-11-13 ENCOUNTER — Other Ambulatory Visit: Payer: Self-pay

## 2019-11-13 DIAGNOSIS — G8929 Other chronic pain: Secondary | ICD-10-CM

## 2019-11-13 DIAGNOSIS — M25511 Pain in right shoulder: Secondary | ICD-10-CM | POA: Diagnosis not present

## 2019-11-13 DIAGNOSIS — M6281 Muscle weakness (generalized): Secondary | ICD-10-CM | POA: Diagnosis not present

## 2019-11-13 DIAGNOSIS — M25611 Stiffness of right shoulder, not elsewhere classified: Secondary | ICD-10-CM

## 2019-11-13 NOTE — Patient Instructions (Signed)
Access Code: [email or web address] URL: [email or web address] Date: 11/13/2019 Prepared by: Ruben Im  Exercises Supine Shoulder Flexion AAROM with Dowel - 1 x daily - 7 x weekly - 1 sets - 10 reps Supine Shoulder Flexion AAROM - 1 x daily - 7 x weekly - 1 sets - 10 reps Seated Scapular Retraction - 1 x daily - 7 x weekly - 1 sets - 10 reps Seated Upper Trapezius Stretch - 1 x daily - 7 x weekly - 1 sets - 3 reps - 30 hold Standing Bilateral Low Shoulder Row with Anchored Resistance - 1 x daily - 7 x weekly - 1 sets - 10 reps Shoulder extension with resistance - Neutral - 1 x daily - 7 x weekly - 1 sets - 10 reps Standing Tricep Extensions with Resistance - 1 x daily - 7 x weekly - 1 sets - 10 reps

## 2019-11-13 NOTE — Therapy (Signed)
[facility] [address] [place], [place], [address] Phone: [phone]   Fax:  [phone]  Physical Therapy Treatment  Patient Details  Name: Brianna Harper MRN: [id number] Date of Birth: 03/29/1946 Referring Provider (PT): Dr. Durward Fortes   Encounter Date: 11/13/2019   PT End of Session - 11/13/19 1450    Visit Number 3    Date for PT Re-Evaluation 01/08/20    Authorization Type Medicare BCBS    PT Start Time 1400    PT Stop Time 1445    PT Time Calculation (min) 45 min    Activity Tolerance Patient tolerated treatment well           Past Medical History:  Diagnosis Date  . Anxiety    no per pt  . 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 Cervicogenic headache 07/19/2018  . Depression   . GERD (gastroesophageal reflux disease)   . Hyperlipidemia   . Hypertension   . Laryngitis   . Migraine    "not very often anymore; they were more menopausal" (04/16/2013)  . Osteopenia    dexa -2.0 hip 2008  . Rapid heart rate    hx of     Past Surgical History:  Procedure Laterality Date  . COLONOSCOPY  02/03/2004   internal hemorrhoids  . IR RADIOLOGIST EVAL & MGMT  04/12/2019  . UPPER GASTROINTESTINAL ENDOSCOPY  08/29/2008   gastritis, hiatal hernia    There were no vitals filed for this visit.   Subjective Assessment - 11/13/19 1400    Subjective It's about the same.  I'm thinking it's tied into my neck.  That sidebending exercise made me worse.  This morning I couldn't put on my housecoat.  Subacromial region, upper arm and right lateral neck stiffness.  Some left neck pain too.    Pertinent History had PT in 2015 at [facility]    Diagnostic tests MRI supraspinatus tendonosis; subacromial bursitis    Currently in Pain? Yes    Pain Score 7     Pain Location Shoulder    Pain Orientation Right    Pain Type Chronic pain              [facility] PT Assessment - 11/13/19 0001      AROM   Right Shoulder Flexion  157 Degrees    Right Shoulder ABduction 166 Degrees                         [facility] Adult PT Treatment/Exercise - 11/13/19 0001      Shoulder Exercises: Standing   Extension Strengthening;Both;10 reps;Theraband    Theraband Level (Shoulder Extension) Level 2 (Red)    Row Strengthening;Both;10 reps;Theraband    Theraband Level (Shoulder Row) Level 2 (Red)    Other Standing Exercises triceps extension red band 10x       Moist Heat Therapy   Number Minutes Moist Heat 3 Minutes    Moist Heat Location Shoulder      Iontophoresis   Type of Iontophoresis Dexamethasone    Location superior shoulder    Dose 4 ma/ml     Time 4-6 hour patch       Manual Therapy   Joint Mobilization glenohumeral distraction and inferior mobs with movement 15x     Soft tissue mobilization upper trap, cervical paraspinals, infraspinatus, supraspinatus     Scapular Mobilization --      Neck Exercises: Stretches  Upper Trapezius Stretch Left;3 reps;30 seconds            Trigger Point Dry Needling - 11/13/19 0001    Consent Given? Yes    Upper Trapezius Response Twitch reponse elicited;Palpable increased muscle length    Cervical multifidi Response Palpable increased muscle length    Supraspinatus Response Palpable increased muscle length    Infraspinatus Response Palpable increased muscle length                PT Education - 11/13/19 1450    Education Details red band rows, extensions and triceps    Person(s) Educated Patient    Methods Explanation;Demonstration;Handout    Comprehension Returned demonstration;Verbalized understanding            PT Short Term Goals - 10/16/19 1801      PT SHORT TERM GOAL #1   Title The patient will demonstrate knowledge of basic HEP for ROM and initial strengthening    Time 6    Period Weeks    Status New    Target Date 11/27/19      PT SHORT TERM GOAL #2   Title The patient will report a 30% reduction in right shoulder pain with  reaching overhead and with morning discomfort    Time 6    Period Weeks    Status New      PT SHORT TERM GOAL #3   Title Right shoulder flexion to 153 degrees and abduction to 160 needed for reaching shelves overhead    Time 6    Period Weeks    Status New             PT Long Term Goals - 10/16/19 1804      PT LONG TERM GOAL #1   Title The patient will be independent in safe self progression of HEP    Time 12    Period Weeks    Status New    Target Date 01/08/20      PT LONG TERM GOAL #2   Title The patient will report a 60% reduction in right shoulder pain in the morningtime and with reaching overhead    Time 12    Period Weeks    Status New      PT LONG TERM GOAL #3   Title The patient will have 4+/5 glenohumeral and scapular strength needed for lifting items on/off the shelves in the fridge    Time 12    Period Weeks    Status New      PT LONG TERM GOAL #4   Title FOTO functional outcome score improved from 50% to 41% indicating improved function with less pain    Time 12    Period Weeks    Status New                 Plan - 11/13/19 1437    Clinical Impression Statement The patient reports she is "no better" after 2 visits however both her shoulder flexion and abduction ROM has increased by 10 degrees.  She reports upper trap stretches worsen the pain but she is able to perform them following dry needling and manual therapy without exacerbation of pain.  She is able to initiate low level scapular strengthening with mild discomfort but not in the usual painful area.  Decreased overall tender points and taut bands but still present in right cervical paraspinals, upper traps and infraspinatus.  Discussed with the patient that a slow progression may be necessary.  Therapist monitoring response with all interventions.    Comorbidities osteopenia; history of chronic neck pain; previous history of right shoulder pain    Examination-Activity Limitations Reach  Overhead;Carry;Sleep;Hygiene/Grooming;Lift    Rehab Potential Good    PT Frequency 1x / week    PT Duration 12 weeks    PT Treatment/Interventions ADLs/Self Care Home Management;Electrical Stimulation;Cryotherapy;Moist Heat;Iontophoresis 4mg /ml Dexamethasone;Ultrasound;Therapeutic exercise;Therapeutic activities;Patient/family education;Manual techniques;Dry needling;Taping    PT Next Visit Plan assess response to DN #2 and ionto #2;  review red band rows and shoulder extensions; try wall push ups, rockwoods or yellow band wall ex's (low level) or low level prone scapular ex    PT Home Exercise Plan [email or web address]           Patient will benefit from skilled therapeutic intervention in order to improve the following deficits and impairments:  Decreased range of motion, Increased fascial restricitons, Impaired UE functional use, Pain, Decreased strength  Visit Diagnosis: Chronic right shoulder pain  Stiffness of right shoulder, not elsewhere classified  Muscle weakness (generalized)     Problem List Patient Active Problem List   Diagnosis Date Noted  . Impingement syndrome of right shoulder 09/11/2019  . Cervicogenic headache 07/19/2018  . Laryngopharyngeal reflux (LPR) 06/18/2014  . Hypokalemia 06/18/2014  . Palpitations 06/18/2014  . Anxiety state 05/24/2014  . Atypical chest pain 04/22/2014  . Cough 04/09/2014  . Chest pain 03/14/2014  . Neck and shoulder pain 12/07/2013  . AC (acromioclavicular) arthritis 11/12/2013  . Acute onset of severe vertigo 09/20/2013  . Essential hypertension 09/20/2013  . Medication management 09/20/2013  . Subacromial bursitis 06/22/2013  . Pain in right shoulder 06/05/2013  . Arm pain, right 06/05/2013  . Acid reflux 05/04/2013  . Hyperglycemia 05/04/2013  . TGA (transient global amnesia) 04/25/2013  . Amnesia 04/15/2013  . Neck pain 03/09/2013  . Laryngitis from reflux of stomach acid- ? 01/01/2013  . Welcome to Medicare preventive visit  05/01/2012  . Leg cramp 05/01/2012  . Intermittent lightheadedness 04/03/2012  . Sinusitis 04/03/2012  . Visit for preventive health examination 07/18/2011  . Constipation 07/18/2011  . Hypertension 07/18/2011  . Urgency of urination 04/06/2011  . Sore throat 06/11/2010  . History of recurrent UTIs 06/11/2010  . OSTEOPENIA 05/18/2009  . Urticaria 07/10/2007  . HYPERLIPIDEMIA 10/28/2006  . ANXIETY 10/28/2006  . DEPRESSION 10/28/2006  . Migraine 10/28/2006  . GERD 10/28/2006  . PMS 10/28/2006   Ruben Im, PT 11/13/19 4:22 PM Phone: [phone] Fax: [phone] Alvera Singh 11/13/2019, 4:22 PM  [facility] [address], [place], [place], [address] Phone: [phone]   Fax:  [phone]  Name: CHERIKA JESSIE MRN: [id number] Date of Birth: [birth year]

## 2019-11-20 ENCOUNTER — Encounter: Payer: Medicare Other | Admitting: Physical Therapy

## 2019-11-20 DIAGNOSIS — E785 Hyperlipidemia, unspecified: Secondary | ICD-10-CM | POA: Diagnosis not present

## 2019-11-20 DIAGNOSIS — R946 Abnormal results of thyroid function studies: Secondary | ICD-10-CM | POA: Diagnosis not present

## 2019-11-26 DIAGNOSIS — Z1212 Encounter for screening for malignant neoplasm of rectum: Secondary | ICD-10-CM | POA: Diagnosis not present

## 2019-11-26 DIAGNOSIS — R519 Headache, unspecified: Secondary | ICD-10-CM | POA: Diagnosis not present

## 2019-11-26 DIAGNOSIS — K219 Gastro-esophageal reflux disease without esophagitis: Secondary | ICD-10-CM | POA: Diagnosis not present

## 2019-11-26 DIAGNOSIS — Z0000 Encounter for general adult medical examination without abnormal findings: Secondary | ICD-10-CM | POA: Diagnosis not present

## 2019-11-26 DIAGNOSIS — E785 Hyperlipidemia, unspecified: Secondary | ICD-10-CM | POA: Diagnosis not present

## 2019-11-26 DIAGNOSIS — I10 Essential (primary) hypertension: Secondary | ICD-10-CM | POA: Diagnosis not present

## 2019-11-26 DIAGNOSIS — G4700 Insomnia, unspecified: Secondary | ICD-10-CM | POA: Diagnosis not present

## 2019-11-26 DIAGNOSIS — E039 Hypothyroidism, unspecified: Secondary | ICD-10-CM | POA: Diagnosis not present

## 2019-11-26 DIAGNOSIS — F419 Anxiety disorder, unspecified: Secondary | ICD-10-CM | POA: Diagnosis not present

## 2019-11-26 DIAGNOSIS — R82998 Other abnormal findings in urine: Secondary | ICD-10-CM | POA: Diagnosis not present

## 2019-11-27 ENCOUNTER — Ambulatory Visit: Payer: Medicare Other | Admitting: Physical Therapy

## 2019-11-27 ENCOUNTER — Other Ambulatory Visit: Payer: Self-pay

## 2019-11-27 DIAGNOSIS — G8929 Other chronic pain: Secondary | ICD-10-CM

## 2019-11-27 DIAGNOSIS — M25611 Stiffness of right shoulder, not elsewhere classified: Secondary | ICD-10-CM

## 2019-11-27 DIAGNOSIS — M25511 Pain in right shoulder: Secondary | ICD-10-CM

## 2019-11-27 DIAGNOSIS — M6281 Muscle weakness (generalized): Secondary | ICD-10-CM

## 2019-11-27 NOTE — Therapy (Signed)
[facility] [address], [place], [place], [address] Phone: [phone]   Fax:  [phone]  Physical Therapy Treatment  Patient Details  Name: Brianna Harper MRN: [id number] Date of Birth: [birth year] Referring Provider (PT): Dr. Durward Fortes   Encounter Date: 11/27/2019   PT End of Session - 11/27/19 2021    Visit Number 4    Date for PT Re-Evaluation 01/08/20    Authorization Type Medicare BCBS    PT Start Time 4665    PT Stop Time 1615    PT Time Calculation (min) 45 min    Activity Tolerance Patient tolerated treatment well           Past Medical History:  Diagnosis Date  . Anxiety    no per pt  . 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 Cervicogenic headache 07/19/2018  . Depression   . GERD (gastroesophageal reflux disease)   . Hyperlipidemia   . Hypertension   . Laryngitis   . Migraine    "not very often anymore; they were more menopausal" (04/16/2013)  . Osteopenia    dexa -2.0 hip 2008  . Rapid heart rate    hx of     Past Surgical History:  Procedure Laterality Date  . COLONOSCOPY  02/03/2004   internal hemorrhoids  . IR RADIOLOGIST EVAL & MGMT  04/12/2019  . UPPER GASTROINTESTINAL ENDOSCOPY  08/29/2008   gastritis, hiatal hernia    There were no vitals filed for this visit.   Subjective Assessment - 11/27/19 1532    Subjective I've seen an improvement in my neck but my shoulder not so much.  I can't tell if the DN or the ionto helps or not.  I have some symptoms in my forearm too.    Pertinent History had PT in 2015 at [facility]    Limitations Lifting;House hold activities    Currently in Pain? Yes    Pain Score 6     Pain Location Shoulder    Pain Orientation Right    Pain Radiating Towards upper arm                             [facility] Adult PT Treatment/Exercise - 11/27/19 0001      Shoulder Exercises: Standing   External Rotation Right;15 reps;Theraband     Theraband Level (Shoulder External Rotation) Level 2 (Red)    External Rotation Limitations small range     Internal Rotation Strengthening;Right;15 reps;Theraband    Theraband Level (Shoulder Internal Rotation) Level 2 (Red)    Internal Rotation Limitations small range    Extension Strengthening;Both;10 reps;Theraband    Theraband Level (Shoulder Extension) Level 3 (Green)    Other Standing Exercises Median nerve flossing 8x right       Shoulder Exercises: ROM/Strengthening   UBE (Upper Arm Bike) 2 min forward/2 min backward       Manual Therapy   Kinesiotex Facilitate Muscle      Kinesiotix   Facilitate Muscle  2 vertical strips: posterior and anterior deltoid and 1 horizontal strip across lower deltoid                   PT Education - 11/27/19 2021    Education Details red band internal and external rotation; median nerve standing floss    Person(s) Educated Patient    Methods Explanation;Demonstration;Handout    Comprehension Returned demonstration;Verbalized understanding  PT Short Term Goals - 11/27/19 2026      PT SHORT TERM GOAL #1   Title The patient will demonstrate knowledge of basic HEP for ROM and initial strengthening    Status Achieved      PT SHORT TERM GOAL #2   Title The patient will report a 30% reduction in right shoulder pain with reaching overhead and with morning discomfort    Time 6    Period Weeks    Status On-going      PT SHORT TERM GOAL #3   Title Right shoulder flexion to 153 degrees and abduction to 160 needed for reaching shelves overhead    Time 6    Period Weeks    Status Partially Met             PT Long Term Goals - 10/16/19 1804      PT LONG TERM GOAL #1   Title The patient will be independent in safe self progression of HEP    Time 12    Period Weeks    Status New    Target Date 01/08/20      PT LONG TERM GOAL #2   Title The patient will report a 60% reduction in right shoulder pain in the  morningtime and with reaching overhead    Time 12    Period Weeks    Status New      PT LONG TERM GOAL #3   Title The patient will have 4+/5 glenohumeral and scapular strength needed for lifting items on/off the shelves in the fridge    Time 12    Period Weeks    Status New      PT LONG TERM GOAL #4   Title FOTO functional outcome score improved from 50% to 41% indicating improved function with less pain    Time 12    Period Weeks    Status New                 Plan - 11/27/19 1546    Clinical Impression Statement The patient is able to progress with periscapular and low level glenohumeral ex's without exacerbation of shoulder pain.  Minimal cues to encourage scapular retraction and depression.  No symptoms with median nerve floss.  Will continue with a graded exposure of exercise secondary to overall high pain levels.  Therapist monitoring response with all interventions.    Personal Factors and Comorbidities Age;Comorbidity 1;Comorbidity 2    Comorbidities osteopenia; history of chronic neck pain; previous history of right shoulder pain    Examination-Activity Limitations Reach Overhead;Carry;Sleep;Hygiene/Grooming;Lift    Rehab Potential Good    PT Frequency 1x / week    PT Duration 12 weeks    PT Treatment/Interventions ADLs/Self Care Home Management;Electrical Stimulation;Cryotherapy;Moist Heat;Iontophoresis 4mg /ml Dexamethasone;Ultrasound;Therapeutic exercise;Therapeutic activities;Patient/family education;Manual techniques;Dry needling;Taping    PT Next Visit Plan check Shoulder ROM and STGs;  hold ionto and DN and focus on exercise;  assess response to KT;  UBE; review interal and external rotation red band as needed from HEP and median nerve floss;  try bent rows and extensions, wall push ups; band "steering wheels"    PT Home Exercise Plan [email or web address]           Patient will benefit from skilled therapeutic intervention in order to improve the following deficits and  impairments:  Decreased range of motion, Increased fascial restricitons, Impaired UE functional use, Pain, Decreased strength  Visit Diagnosis: Chronic right shoulder pain  Stiffness of right shoulder,  not elsewhere classified  Muscle weakness (generalized)     Problem List Patient Active Problem List   Diagnosis Date Noted  . Impingement syndrome of right shoulder 09/11/2019  . Cervicogenic headache 07/19/2018  . Laryngopharyngeal reflux (LPR) 06/18/2014  . Hypokalemia 06/18/2014  . Palpitations 06/18/2014  . Anxiety state 05/24/2014  . Atypical chest pain 04/22/2014  . Cough 04/09/2014  . Chest pain 03/14/2014  . Neck and shoulder pain 12/07/2013  . AC (acromioclavicular) arthritis 11/12/2013  . Acute onset of severe vertigo 09/20/2013  . Essential hypertension 09/20/2013  . Medication management 09/20/2013  . Subacromial bursitis 06/22/2013  . Pain in right shoulder 06/05/2013  . Arm pain, right 06/05/2013  . Acid reflux 05/04/2013  . Hyperglycemia 05/04/2013  . TGA (transient global amnesia) 04/25/2013  . Amnesia 04/15/2013  . Neck pain 03/09/2013  . Laryngitis from reflux of stomach acid- ? 01/01/2013  . Welcome to Medicare preventive visit 05/01/2012  . Leg cramp 05/01/2012  . Intermittent lightheadedness 04/03/2012  . Sinusitis 04/03/2012  . Visit for preventive health examination 07/18/2011  . Constipation 07/18/2011  . Hypertension 07/18/2011  . Urgency of urination 04/06/2011  . Sore throat 06/11/2010  . History of recurrent UTIs 06/11/2010  . OSTEOPENIA 05/18/2009  . Urticaria 07/10/2007  . HYPERLIPIDEMIA 10/28/2006  . ANXIETY 10/28/2006  . DEPRESSION 10/28/2006  . Migraine 10/28/2006  . GERD 10/28/2006  . PMS 10/28/2006   Ruben Im, PT 11/27/19 8:29 PM Phone: [phone] Fax: [phone] Alvera Singh 11/27/2019, 8:28 PM  [facility] [address] [place], [place],  [address] Phone: [phone]   Fax:  [phone]  Name: Brianna Harper MRN: [id number] Date of Birth: 09-22-1945

## 2019-11-27 NOTE — Patient Instructions (Signed)
Access Code: [email or web address] URL: [email or web address] Date: 11/27/2019 Prepared by: Ruben Im  Exercises Supine Shoulder Flexion AAROM with Dowel - 1 x daily - 7 x weekly - 1 sets - 10 reps Supine Shoulder Flexion AAROM - 1 x daily - 7 x weekly - 1 sets - 10 reps Seated Scapular Retraction - 1 x daily - 7 x weekly - 1 sets - 10 reps Seated Upper Trapezius Stretch - 1 x daily - 7 x weekly - 1 sets - 3 reps - 30 hold Standing Bilateral Low Shoulder Row with Anchored Resistance - 1 x daily - 7 x weekly - 1 sets - 10 reps Shoulder extension with resistance - Neutral - 1 x daily - 7 x weekly - 1 sets - 10 reps Standing Tricep Extensions with Resistance - 1 x daily - 7 x weekly - 1 sets - 10 reps Shoulder External Rotation with Anchored Resistance - 1 x daily - 7 x weekly - 1 sets - 10 reps Shoulder Internal Rotation with Resistance - 1 x daily - 7 x weekly - 1 sets - 10 reps Median Nerve Flossing - 1 x daily - 7 x weekly - 1 sets - 1 reps

## 2019-12-04 ENCOUNTER — Ambulatory Visit: Payer: Medicare Other | Attending: Orthopaedic Surgery | Admitting: Physical Therapy

## 2019-12-04 ENCOUNTER — Other Ambulatory Visit: Payer: Self-pay

## 2019-12-04 DIAGNOSIS — M25611 Stiffness of right shoulder, not elsewhere classified: Secondary | ICD-10-CM | POA: Diagnosis not present

## 2019-12-04 DIAGNOSIS — M25511 Pain in right shoulder: Secondary | ICD-10-CM | POA: Insufficient documentation

## 2019-12-04 DIAGNOSIS — M6281 Muscle weakness (generalized): Secondary | ICD-10-CM | POA: Diagnosis not present

## 2019-12-04 DIAGNOSIS — G8929 Other chronic pain: Secondary | ICD-10-CM | POA: Diagnosis not present

## 2019-12-04 NOTE — Therapy (Signed)
[facility] [address] [place], [place], [address] Phone: [phone]   Fax:  [phone]  Physical Therapy Treatment  Patient Details  Name: Brianna Harper MRN: [id number] Date of Birth: 08-15-1945 Referring Provider (PT): Dr. Durward Fortes   Encounter Date: 12/04/2019   PT End of Session - 12/04/19 1817    Visit Number 5    Date for PT Re-Evaluation 01/08/20    Authorization Type Medicare BCBS    PT Start Time 0930    PT Stop Time 1013    PT Time Calculation (min) 43 min    Activity Tolerance Patient tolerated treatment well           Past Medical History:  Diagnosis Date   Anxiety    no per pt   Cancer ([place])    squamous cell- left leg   Cervicogenic headache 07/19/2018   Depression    GERD (gastroesophageal reflux disease)    Hyperlipidemia    Hypertension    Laryngitis    Migraine    "not very often anymore; they were more menopausal" (04/16/2013)   Osteopenia    dexa -2.0 hip 2008   Rapid heart rate    hx of     Past Surgical History:  Procedure Laterality Date   COLONOSCOPY  02/03/2004   internal hemorrhoids   IR RADIOLOGIST EVAL & MGMT  04/12/2019   UPPER GASTROINTESTINAL ENDOSCOPY  08/29/2008   gastritis, hiatal hernia    There were no vitals filed for this visit.   Subjective Assessment - 12/04/19 0934    Subjective I'm feeling good.  Good and bad days.  I notice it (pain) in my upper arm more above the elbow.  I have trouble with the band pulldowns.  I wore the tape until Sunday and it didn't change much.    Pertinent History had PT in 2015 at [facility]    Diagnostic tests MRI supraspinatus tendonosis; subacromial bursitis    Patient Stated Goals no pain; use arm for daily tasks    Currently in Pain? Yes    Pain Score 5     Pain Location Shoulder    Pain Orientation Right                             [facility] Adult PT Treatment/Exercise - 12/04/19 0001       Shoulder Exercises: Standing   External Rotation Right;15 reps;Theraband    Theraband Level (Shoulder External Rotation) Level 2 (Red)    External Rotation Limitations small range    corrected technique    Internal Rotation Strengthening;Right;15 reps;Theraband    Theraband Level (Shoulder Internal Rotation) Level 2 (Red)    Internal Rotation Limitations small range    Extension Strengthening;Both;10 reps;Theraband    Theraband Level (Shoulder Extension) Level 3 (Green)    Other Standing Exercises Median nerve flossing 8x right     Other Standing Exercises red band around wrists ''steering wheels" 10x       Shoulder Exercises: ROM/Strengthening   UBE (Upper Arm Bike) 2 min forward/2 min backward     Wall Pushups 10 reps    Other ROM/Strengthening Exercises red band around wrists wall walk 10x                     PT Short Term Goals - 11/27/19 2026      PT SHORT TERM GOAL #1   Title The patient will  demonstrate knowledge of basic HEP for ROM and initial strengthening    Status Achieved      PT SHORT TERM GOAL #2   Title The patient will report a 30% reduction in right shoulder pain with reaching overhead and with morning discomfort    Time 6    Period Weeks    Status On-going      PT SHORT TERM GOAL #3   Title Right shoulder flexion to 153 degrees and abduction to 160 needed for reaching shelves overhead    Time 6    Period Weeks    Status Partially Met             PT Long Term Goals - 10/16/19 1804      PT LONG TERM GOAL #1   Title The patient will be independent in safe self progression of HEP    Time 12    Period Weeks    Status New    Target Date 01/08/20      PT LONG TERM GOAL #2   Title The patient will report a 60% reduction in right shoulder pain in the morningtime and with reaching overhead    Time 12    Period Weeks    Status New      PT LONG TERM GOAL #3   Title The patient will have 4+/5 glenohumeral and scapular strength needed for  lifting items on/off the shelves in the fridge    Time 12    Period Weeks    Status New      PT LONG TERM GOAL #4   Title FOTO functional outcome score improved from 50% to 41% indicating improved function with less pain    Time 12    Period Weeks    Status New                 Plan - 12/04/19 0951    Clinical Impression Statement The patient reports variable shoulder pain with a slower improvement secondary to chronic history of neck pain, possible nerve entrapment in addition to shoulder pain diagnosis.  She reports minimal benefit from DN, ionto and kinesiotape so we have focused on therapeutic exercise.  Education on expected response to exercise (muscle fatigue is good, mild muscle soreness post ex is good, Ok to feel a pull while stretching and anticipated areas where muscles are working).  Some verbal cues needed to decrease compensatory shoulder hike.  She may need further review of HEP to check for best technique.    Comorbidities osteopenia; history of chronic neck pain; previous history of right shoulder pain    Examination-Activity Limitations Reach Overhead;Carry;Sleep;Hygiene/Grooming;Lift    Rehab Potential Good    PT Frequency 1x / week    PT Duration 12 weeks    PT Treatment/Interventions ADLs/Self Care Home Management;Electrical Stimulation;Cryotherapy;Moist Heat;Iontophoresis 58m/ml Dexamethasone;Ultrasound;Therapeutic exercise;Therapeutic activities;Patient/family education;Manual techniques;Dry needling;Taping    PT Next Visit Plan check Shoulder ROM and STGs;  hold ionto, KT and DN and focus on exercise;   UBE; review red band wall walks and "steering wheels" and wall push ups as needed from HEP; try bent rows and shoulder extensions    PT Home Exercise Plan [email or web address]           Patient will benefit from skilled therapeutic intervention in order to improve the following deficits and impairments:  Decreased range of motion, Increased fascial restricitons, Impaired  UE functional use, Pain, Decreased strength  Visit Diagnosis: Chronic right shoulder pain  Stiffness of right  shoulder, not elsewhere classified  Muscle weakness (generalized)     Problem List Patient Active Problem List   Diagnosis Date Noted   Impingement syndrome of right shoulder 09/11/2019   Cervicogenic headache 07/19/2018   Laryngopharyngeal reflux (LPR) 06/18/2014   Hypokalemia 06/18/2014   Palpitations 06/18/2014   Anxiety state 05/24/2014   Atypical chest pain 04/22/2014   Cough 04/09/2014   Chest pain 03/14/2014   Neck and shoulder pain 12/07/2013   AC (acromioclavicular) arthritis 11/12/2013   Acute onset of severe vertigo 09/20/2013   Essential hypertension 09/20/2013   Medication management 09/20/2013   Subacromial bursitis 06/22/2013   Pain in right shoulder 06/05/2013   Arm pain, right 06/05/2013   Acid reflux 05/04/2013   Hyperglycemia 05/04/2013   TGA (transient global amnesia) 04/25/2013   Amnesia 04/15/2013   Neck pain 03/09/2013   Laryngitis from reflux of stomach acid- ? 01/01/2013   Welcome to Medicare preventive visit 05/01/2012   Leg cramp 05/01/2012   Intermittent lightheadedness 04/03/2012   Sinusitis 04/03/2012   Visit for preventive health examination 07/18/2011   Constipation 07/18/2011   Hypertension 07/18/2011   Urgency of urination 04/06/2011   Sore throat 06/11/2010   History of recurrent UTIs 06/11/2010   OSTEOPENIA 05/18/2009   Urticaria 07/10/2007   HYPERLIPIDEMIA 10/28/2006   ANXIETY 10/28/2006   DEPRESSION 10/28/2006   Migraine 10/28/2006   GERD 10/28/2006   PMS 10/28/2006   Ruben Im, PT 12/04/19 6:40 PM Phone: [phone] Fax: [phone] Alvera Singh 12/04/2019, 6:40 PM  [facility] [address], [place], [place], [address] Phone: [phone]   Fax:  [phone]  Name: RENESSA WELLNITZ MRN:  [id number] Date of Birth: [birth year]

## 2019-12-11 ENCOUNTER — Other Ambulatory Visit: Payer: Self-pay

## 2019-12-11 ENCOUNTER — Encounter: Payer: Self-pay | Admitting: Physical Therapy

## 2019-12-11 ENCOUNTER — Ambulatory Visit: Payer: Medicare Other | Admitting: Physical Therapy

## 2019-12-11 DIAGNOSIS — M25611 Stiffness of right shoulder, not elsewhere classified: Secondary | ICD-10-CM | POA: Diagnosis not present

## 2019-12-11 DIAGNOSIS — G8929 Other chronic pain: Secondary | ICD-10-CM | POA: Diagnosis not present

## 2019-12-11 DIAGNOSIS — M25511 Pain in right shoulder: Secondary | ICD-10-CM | POA: Diagnosis not present

## 2019-12-11 DIAGNOSIS — M6281 Muscle weakness (generalized): Secondary | ICD-10-CM

## 2019-12-11 NOTE — Patient Instructions (Signed)
Access Code: [email or web address]: [email or web address]: 09/14/2021Prepared by: [facility]  Seated Upper Trapezius Stretch - 1 x daily - 7 x weekly - 1 sets - 3 reps - 30 hold  Shoulder extension with resistance - Neutral - 1 x daily - 7 x weekly - 1 sets - 10 reps  Standing Tricep Extensions with Resistance - 1 x daily - 7 x weekly - 1 sets - 10 reps  Shoulder External Rotation with Anchored Resistance - 1 x daily - 7 x weekly - 1 sets - 10 reps  Shoulder Internal Rotation with Resistance - 1 x daily - 7 x weekly - 1 sets - 10 reps  Median Nerve Flossing - 1 x daily - 7 x weekly - 1 sets - 1 reps  Wall Push Up - 1 x daily - 7 x weekly - 1 sets - 10 reps  Wall walk - 1 x daily - 7 x weekly - 3 sets - 10 reps  Standing Bent Over Single Arm Scapular Row with Table Support with PLB - 1 x daily - 7 x weekly - 2 sets - 10 reps   [facility] [address] [place], [place] [address] Phone # [phone] Fax [phone]

## 2019-12-11 NOTE — Therapy (Signed)
[facility] [address] [place], [place], [address] Phone: [phone]   Fax:  [phone]  Physical Therapy Treatment  Patient Details  Name: Brianna Harper MRN: [id number] Date of Birth: April 18, 1945 Referring Provider (PT): Dr. Durward Fortes   Encounter Date: 12/11/2019   PT End of Session - 12/11/19 1617    Visit Number 6    Date for PT Re-Evaluation 01/08/20    Authorization Type Medicare BCBS    PT Start Time 1018    PT Stop Time 1059    PT Time Calculation (min) 41 min    Activity Tolerance Patient tolerated treatment well;No increased pain    Behavior During Therapy WFL for tasks assessed/performed           Past Medical History:  Diagnosis Date  . Anxiety    no per pt  . 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 Cervicogenic headache 07/19/2018  . Depression   . GERD (gastroesophageal reflux disease)   . Hyperlipidemia   . Hypertension   . Laryngitis   . Migraine    "not very often anymore; they were more menopausal" (04/16/2013)  . Osteopenia    dexa -2.0 hip 2008  . Rapid heart rate    hx of     Past Surgical History:  Procedure Laterality Date  . COLONOSCOPY  02/03/2004   internal hemorrhoids  . IR RADIOLOGIST EVAL & MGMT  04/12/2019  . UPPER GASTROINTESTINAL ENDOSCOPY  08/29/2008   gastritis, hiatal hernia    There were no vitals filed for this visit.   Subjective Assessment - 12/11/19 1020    Subjective Pt states that is still having pain going down the arm throughout the week. She is not having trouble with her HEP.    Pertinent History had PT in 2015 at [facility]    Diagnostic tests MRI supraspinatus tendonosis; subacromial bursitis    Patient Stated Goals no pain; use arm for daily tasks    Currently in Pain? No/denies                             [facility] Adult PT Treatment/Exercise - 12/11/19 0001      Exercises   Exercises Shoulder      Shoulder Exercises:  Standing   External Rotation Strengthening;Right;10 reps    Theraband Level (Shoulder External Rotation) Level 2 (Red)    External Rotation Limitations full range with initial cuing for posterior shoulder activation    Internal Rotation Strengthening;Right;10 reps    Theraband Level (Shoulder Internal Rotation) Level 3 (Green)    Internal Rotation Limitations to neutral     Extension Strengthening;Both;10 reps;Theraband    Theraband Level (Shoulder Extension) Level 3 (Green)    Other Standing Exercises Rt UE radial nerve flossing x10 reps       Shoulder Exercises: ROM/Strengthening   UBE (Upper Arm Bike) 2 min forward/2 min backward     Other ROM/Strengthening Exercises red TB around wrists wall walk Lt/Rt 3x10 reps       Shoulder Exercises: Stretch   Other Shoulder Stretches pec stretch in doorway 2x20 sec 90 deg, and 120 deg                   PT Education - 12/11/19 1055    Education Details updates to [employer]) Educated Patient    Methods Explanation    Comprehension Verbalized understanding  PT Short Term Goals - 12/11/19 1023      PT SHORT TERM GOAL #1   Title The patient will demonstrate knowledge of basic HEP for ROM and initial strengthening    Status Achieved      PT SHORT TERM GOAL #2   Title The patient will report a 30% reduction in right shoulder pain with reaching overhead and with morning discomfort    Baseline 60% improved    Time 6    Period Weeks    Status Achieved      PT SHORT TERM GOAL #3   Title Right shoulder flexion to 153 degrees and abduction to 160 needed for reaching shelves overhead    Baseline flexion 155 deg, 160 deg abduction    Time 6    Period Weeks    Status Achieved             PT Long Term Goals - 10/16/19 1804      PT LONG TERM GOAL #1   Title The patient will be independent in safe self progression of HEP    Time 12    Period Weeks    Status New    Target Date 01/08/20      PT LONG TERM  GOAL #2   Title The patient will report a 60% reduction in right shoulder pain in the morningtime and with reaching overhead    Time 12    Period Weeks    Status New      PT LONG TERM GOAL #3   Title The patient will have 4+/5 glenohumeral and scapular strength needed for lifting items on/off the shelves in the fridge    Time 12    Period Weeks    Status New      PT LONG TERM GOAL #4   Title FOTO functional outcome score improved from 50% to 41% indicating improved function with less pain    Time 12    Period Weeks    Status New                 Plan - 12/11/19 1617    Clinical Impression Statement Pt is doing well with her HEP. Pt reviewed aspects of this and made some updates to reflect improvements in shoulder strength and ROM. Pt has met all short-term goals. Pt was able to progress to shoulder external rotation through her full available range without pain, but she required PT cuing to focus on posterior shoulder activation instead of compensating through her bicep. Pt denied pain end of session. Will continue with current POC.    Comorbidities osteopenia; history of chronic neck pain; previous history of right shoulder pain    Examination-Activity Limitations Reach Overhead;Carry;Sleep;Hygiene/Grooming;Lift    Rehab Potential Good    PT Frequency 1x / week    PT Duration 12 weeks    PT Treatment/Interventions ADLs/Self Care Home Management;Electrical Stimulation;Cryotherapy;Moist Heat;Iontophoresis 55m/ml Dexamethasone;Ultrasound;Therapeutic exercise;Therapeutic activities;Patient/family education;Manual techniques;Dry needling;Taping    PT Next Visit Plan update IR/ER to fukk range if able; hold ionto, KT and DN and focus on exercise;   UBE; review red band wall walks and "steering wheels" and wall push ups as needed from HEP; try bent rows and shoulder extensions    PT Home Exercise Plan [email or web address]           Patient will benefit from skilled therapeutic intervention in  order to improve the following deficits and impairments:  Decreased range of motion, Increased fascial restricitons, Impaired UE functional  use, Pain, Decreased strength  Visit Diagnosis: Chronic right shoulder pain  Stiffness of right shoulder, not elsewhere classified  Muscle weakness (generalized)     Problem List Patient Active Problem List   Diagnosis Date Noted  . Impingement syndrome of right shoulder 09/11/2019  . Cervicogenic headache 07/19/2018  . Laryngopharyngeal reflux (LPR) 06/18/2014  . Hypokalemia 06/18/2014  . Palpitations 06/18/2014  . Anxiety state 05/24/2014  . Atypical chest pain 04/22/2014  . Cough 04/09/2014  . Chest pain 03/14/2014  . Neck and shoulder pain 12/07/2013  . AC (acromioclavicular) arthritis 11/12/2013  . Acute onset of severe vertigo 09/20/2013  . Essential hypertension 09/20/2013  . Medication management 09/20/2013  . Subacromial bursitis 06/22/2013  . Pain in right shoulder 06/05/2013  . Arm pain, right 06/05/2013  . Acid reflux 05/04/2013  . Hyperglycemia 05/04/2013  . TGA (transient global amnesia) 04/25/2013  . Amnesia 04/15/2013  . Neck pain 03/09/2013  . Laryngitis from reflux of stomach acid- ? 01/01/2013  . Welcome to Medicare preventive visit 05/01/2012  . Leg cramp 05/01/2012  . Intermittent lightheadedness 04/03/2012  . Sinusitis 04/03/2012  . Visit for preventive health examination 07/18/2011  . Constipation 07/18/2011  . Hypertension 07/18/2011  . Urgency of urination 04/06/2011  . Sore throat 06/11/2010  . History of recurrent UTIs 06/11/2010  . OSTEOPENIA 05/18/2009  . Urticaria 07/10/2007  . HYPERLIPIDEMIA 10/28/2006  . ANXIETY 10/28/2006  . DEPRESSION 10/28/2006  . Migraine 10/28/2006  . GERD 10/28/2006  . PMS 10/28/2006    4:25 PM,12/11/19 Sherol Dade PT, DPT [facility] [address], [place], [place], [address] Phone: [phone]   Fax:  [phone]  Name: Brianna Harper MRN: [id number] Date of Birth: [birth year]

## 2019-12-18 ENCOUNTER — Encounter: Payer: Medicare Other | Admitting: Physical Therapy

## 2019-12-25 ENCOUNTER — Ambulatory Visit: Payer: Medicare Other | Admitting: Physical Therapy

## 2019-12-25 ENCOUNTER — Other Ambulatory Visit: Payer: Self-pay

## 2019-12-25 DIAGNOSIS — M6281 Muscle weakness (generalized): Secondary | ICD-10-CM

## 2019-12-25 DIAGNOSIS — M25511 Pain in right shoulder: Secondary | ICD-10-CM | POA: Diagnosis not present

## 2019-12-25 DIAGNOSIS — G8929 Other chronic pain: Secondary | ICD-10-CM

## 2019-12-25 DIAGNOSIS — M25611 Stiffness of right shoulder, not elsewhere classified: Secondary | ICD-10-CM | POA: Diagnosis not present

## 2019-12-25 NOTE — Therapy (Signed)
[facility] [address] [place], [place], [address] Phone: [phone]   Fax:  [phone]  Physical Therapy Treatment  Patient Details  Name: Brianna Harper MRN: [id number] Date of Birth: 05-03-1945 Referring Provider (PT): Dr. Durward Fortes   Encounter Date: 12/25/2019   PT End of Session - 12/25/19 1753    Visit Number 7    Date for PT Re-Evaluation 01/08/20    Authorization Type Medicare BCBS    PT Start Time 6381    PT Stop Time 1055    PT Time Calculation (min) 40 min    Activity Tolerance Patient tolerated treatment well;No increased pain           Past Medical History:  Diagnosis Date  . Anxiety    no per pt  . 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 Cervicogenic headache 07/19/2018  . Depression   . GERD (gastroesophageal reflux disease)   . Hyperlipidemia   . Hypertension   . Laryngitis   . Migraine    "not very often anymore; they were more menopausal" (04/16/2013)  . Osteopenia    dexa -2.0 hip 2008  . Rapid heart rate    hx of     Past Surgical History:  Procedure Laterality Date  . COLONOSCOPY  02/03/2004   internal hemorrhoids  . IR RADIOLOGIST EVAL & MGMT  04/12/2019  . UPPER GASTROINTESTINAL ENDOSCOPY  08/29/2008   gastritis, hiatal hernia    There were no vitals filed for this visit.   Subjective Assessment - 12/25/19 1015    Subjective I feel better.  We're getting there.  A lot of what I feel goes down my arm, it's mostly arm fatigue.  It feels weak as well.  It usually gets better as the day goes on.  Usually in the mornings.  I sleep well though.    Pertinent History had PT in 2015 at [facility]    Currently in Pain? No/denies    Pain Score 0-No pain    Aggravating Factors  mornings    Pain Relieving Factors "activity is my friend"                             [facility] Adult PT Treatment/Exercise - 12/25/19 0001      Shoulder Exercises: Prone   Other Prone  Exercises kneeling on mat rows 5# 15x    Other Prone Exercises kneeling on mat 5# shoulder extension 15x   verbal cues for slight chin tuck      Shoulder Exercises: Standing   External Rotation Strengthening;Right;10 reps    Theraband Level (Shoulder External Rotation) Level 3 (Green)    Internal Rotation Strengthening;Right;10 reps    Theraband Level (Shoulder Internal Rotation) Level 3 (Green)    Internal Rotation Limitations to neutral     Extension Strengthening;Both;10 reps;Theraband    Theraband Level (Shoulder Extension) Level 3 (Green)    Other Standing Exercises Rt UE radial nerve flossing x10 reps     Other Standing Exercises right UE median nerve floss 10x       Shoulder Exercises: ROM/Strengthening   UBE (Upper Arm Bike) 2 min forward/2 min backward     Other ROM/Strengthening Exercises red TB around wrists wall walk Lt/Rt 3x10 reps       Shoulder Exercises: Stretch   Other Shoulder Stretches pec stretch in doorway 2x20 sec 90 deg, and 120 deg  PT Education - 12/25/19 1753    Education Details bent forward shoulder extensions    Person(s) Educated Patient    Methods Explanation;Demonstration;Handout    Comprehension Returned demonstration;Verbalized understanding            PT Short Term Goals - 12/11/19 1023      PT SHORT TERM GOAL #1   Title The patient will demonstrate knowledge of basic HEP for ROM and initial strengthening    Status Achieved      PT SHORT TERM GOAL #2   Title The patient will report a 30% reduction in right shoulder pain with reaching overhead and with morning discomfort    Baseline 60% improved    Time 6    Period Weeks    Status Achieved      PT SHORT TERM GOAL #3   Title Right shoulder flexion to 153 degrees and abduction to 160 needed for reaching shelves overhead    Baseline flexion 155 deg, 160 deg abduction    Time 6    Period Weeks    Status Achieved             PT Long Term Goals - 10/16/19  1804      PT LONG TERM GOAL #1   Title The patient will be independent in safe self progression of HEP    Time 12    Period Weeks    Status New    Target Date 01/08/20      PT LONG TERM GOAL #2   Title The patient will report a 60% reduction in right shoulder pain in the morningtime and with reaching overhead    Time 12    Period Weeks    Status New      PT LONG TERM GOAL #3   Title The patient will have 4+/5 glenohumeral and scapular strength needed for lifting items on/off the shelves in the fridge    Time 12    Period Weeks    Status New      PT LONG TERM GOAL #4   Title FOTO functional outcome score improved from 50% to 41% indicating improved function with less pain    Time 12    Period Weeks    Status New                 Plan - 12/25/19 1020    Clinical Impression Statement The patient reports decreased pain intensity and frequency of symptoms however right upper arm discomfort persists.   Good carryover with HEP but with some verbal cues for best technique (wrists neutral and slight chin tuck).  Some compensatory shoulder hike with 5# weight vs. 3#.  Therapist monitoring response throughout treatment session.  She should meet the majority of goals next visit.    Comorbidities osteopenia; history of chronic neck pain; previous history of right shoulder pain    PT Frequency 1x / week    PT Duration 12 weeks    PT Treatment/Interventions ADLs/Self Care Home Management;Electrical Stimulation;Cryotherapy;Moist Heat;Iontophoresis 4mg /ml Dexamethasone;Ultrasound;Therapeutic exercise;Therapeutic activities;Patient/family education;Manual techniques;Dry needling;Taping    PT Next Visit Plan recheck ROM, MMT of shoulder;  FOTO; progress toward goals;  review/finalize HEP for expected discharge    PT Home Exercise Plan [email or web address]           Patient will benefit from skilled therapeutic intervention in order to improve the following deficits and impairments:  Decreased range  of motion, Increased fascial restricitons, Impaired UE functional use, Pain, Decreased strength  Visit  Diagnosis: Chronic right shoulder pain  Stiffness of right shoulder, not elsewhere classified  Muscle weakness (generalized)     Problem List Patient Active Problem List   Diagnosis Date Noted  . Impingement syndrome of right shoulder 09/11/2019  . Cervicogenic headache 07/19/2018  . Laryngopharyngeal reflux (LPR) 06/18/2014  . Hypokalemia 06/18/2014  . Palpitations 06/18/2014  . Anxiety state 05/24/2014  . Atypical chest pain 04/22/2014  . Cough 04/09/2014  . Chest pain 03/14/2014  . Neck and shoulder pain 12/07/2013  . AC (acromioclavicular) arthritis 11/12/2013  . Acute onset of severe vertigo 09/20/2013  . Essential hypertension 09/20/2013  . Medication management 09/20/2013  . Subacromial bursitis 06/22/2013  . Pain in right shoulder 06/05/2013  . Arm pain, right 06/05/2013  . Acid reflux 05/04/2013  . Hyperglycemia 05/04/2013  . TGA (transient global amnesia) 04/25/2013  . Amnesia 04/15/2013  . Neck pain 03/09/2013  . Laryngitis from reflux of stomach acid- ? 01/01/2013  . Welcome to Medicare preventive visit 05/01/2012  . Leg cramp 05/01/2012  . Intermittent lightheadedness 04/03/2012  . Sinusitis 04/03/2012  . Visit for preventive health examination 07/18/2011  . Constipation 07/18/2011  . Hypertension 07/18/2011  . Urgency of urination 04/06/2011  . Sore throat 06/11/2010  . History of recurrent UTIs 06/11/2010  . OSTEOPENIA 05/18/2009  . Urticaria 07/10/2007  . HYPERLIPIDEMIA 10/28/2006  . ANXIETY 10/28/2006  . DEPRESSION 10/28/2006  . Migraine 10/28/2006  . GERD 10/28/2006  . PMS 10/28/2006   Ruben Im, PT 12/25/19 5:59 PM Phone: [phone] Fax: [phone] Alvera Singh 12/25/2019, 5:59 PM  [facility] [address], [place], [place], [address] Phone: [phone]    Fax:  [phone]  Name: ARNESHIA ADE MRN: [id number] Date of Birth: [birth year]

## 2019-12-25 NOTE — Patient Instructions (Signed)
Access Code: [email or web address] URL: [email or web address] Date: 12/25/2019 Prepared by: Ruben Im  Exercises Seated Upper Trapezius Stretch - 1 x daily - 7 x weekly - 1 sets - 3 reps - 30 hold Shoulder extension with resistance - Neutral - 1 x daily - 7 x weekly - 1 sets - 10 reps Standing Tricep Extensions with Resistance - 1 x daily - 7 x weekly - 1 sets - 10 reps Shoulder External Rotation with Anchored Resistance - 1 x daily - 7 x weekly - 1 sets - 10 reps Shoulder Internal Rotation with Resistance - 1 x daily - 7 x weekly - 1 sets - 10 reps Median Nerve Flossing - 1 x daily - 7 x weekly - 1 sets - 1 reps Wall Push Up - 1 x daily - 7 x weekly - 1 sets - 10 reps Wall walk - 1 x daily - 7 x weekly - 3 sets - 10 reps Standing Bent Over Single Arm Scapular Row with Table Support with PLB - 1 x daily - 7 x weekly - 2 sets - 10 reps Single Arm Bent Over Shoulder Extension with Dumbbell - 1 x daily - 7 x weekly - 1 sets - 10 reps

## 2019-12-31 DIAGNOSIS — H25013 Cortical age-related cataract, bilateral: Secondary | ICD-10-CM | POA: Diagnosis not present

## 2019-12-31 DIAGNOSIS — H2513 Age-related nuclear cataract, bilateral: Secondary | ICD-10-CM | POA: Diagnosis not present

## 2019-12-31 DIAGNOSIS — H04123 Dry eye syndrome of bilateral lacrimal glands: Secondary | ICD-10-CM | POA: Diagnosis not present

## 2019-12-31 DIAGNOSIS — H5203 Hypermetropia, bilateral: Secondary | ICD-10-CM | POA: Diagnosis not present

## 2020-01-01 ENCOUNTER — Other Ambulatory Visit: Payer: Self-pay

## 2020-01-01 ENCOUNTER — Ambulatory Visit: Payer: Medicare Other | Attending: Orthopaedic Surgery | Admitting: Physical Therapy

## 2020-01-01 DIAGNOSIS — M25511 Pain in right shoulder: Secondary | ICD-10-CM | POA: Diagnosis not present

## 2020-01-01 DIAGNOSIS — M25611 Stiffness of right shoulder, not elsewhere classified: Secondary | ICD-10-CM

## 2020-01-01 DIAGNOSIS — G8929 Other chronic pain: Secondary | ICD-10-CM | POA: Diagnosis not present

## 2020-01-01 DIAGNOSIS — M6281 Muscle weakness (generalized): Secondary | ICD-10-CM | POA: Diagnosis not present

## 2020-01-01 NOTE — Therapy (Signed)
[facility] [address] [place], [place], [address] Phone: [phone]   Fax:  [phone]  Physical Therapy Treatment/Discharge Summary   Patient Details  Name: Brianna Harper MRN: [id number] Date of Birth: [birth year] Referring Provider (PT): Dr. Durward Fortes   Encounter Date: 01/01/2020   PT End of Session - 01/01/20 1550    Visit Number 8    Date for PT Re-Evaluation 01/08/20    Authorization Type Medicare BCBS    PT Start Time 8527    PT Stop Time 1057    PT Time Calculation (min) 42 min    Activity Tolerance Patient tolerated treatment well           Past Medical History:  Diagnosis Date   Anxiety    no per pt   Cancer ([place])    squamous cell- left leg   Cervicogenic headache 07/19/2018   Depression    GERD (gastroesophageal reflux disease)    Hyperlipidemia    Hypertension    Laryngitis    Migraine    "not very often anymore; they were more menopausal" (04/16/2013)   Osteopenia    dexa -2.0 hip 2008   Rapid heart rate    hx of     Past Surgical History:  Procedure Laterality Date   COLONOSCOPY  02/03/2004   internal hemorrhoids   IR RADIOLOGIST EVAL & MGMT  04/12/2019   UPPER GASTROINTESTINAL ENDOSCOPY  08/29/2008   gastritis, hiatal hernia    There were no vitals filed for this visit.   Subjective Assessment - 01/01/20 1015    Subjective Feeling good but this arm is killing me today.  It's getting to be a pattern of good days and bad days.  Overall I'm about 50% better.    Pertinent History had PT in 2015 at [facility]    Currently in Pain? Yes    Pain Score 4     Pain Location Shoulder              [facility] PT Assessment - 01/01/20 0001      Observation/Other Assessments   Focus on Therapeutic Outcomes (FOTO)  41% limitation       AROM   Right Shoulder Flexion 157 Degrees    Right Shoulder ABduction 160 Degrees   pain   Right Shoulder Internal Rotation --   T10   Right  Shoulder External Rotation 90 Degrees    Left Shoulder Flexion 160 Degrees    Left Shoulder ABduction 164 Degrees      Strength   Overall Strength Comments middle/lower trap 4/5     Right Shoulder Flexion 4+/5    Right Shoulder Extension 4+/5    Right Shoulder ABduction 4+/5    Right Shoulder Internal Rotation 4+/5                         [facility] Adult PT Treatment/Exercise - 01/01/20 0001      Shoulder Exercises: Seated   Other Seated Exercises review of upper trap stretch 3x 30 sec right       Shoulder Exercises: Prone   Horizontal ABduction 1 AROM;Strengthening;Right;15 reps      Shoulder Exercises: Standing   External Rotation Strengthening;Right;10 reps    Theraband Level (Shoulder External Rotation) Level 3 (Green)    Internal Rotation Strengthening;Right;10 reps    Theraband Level (Shoulder Internal Rotation) Level 3 (Green)    Internal Rotation Limitations to neutral     Extension  Strengthening;Both;10 reps;Theraband    Theraband Level (Shoulder Extension) Level 3 (Green)    Other Standing Exercises Rt UE radial nerve flossing x10 reps     Other Standing Exercises right UE median nerve floss 10x       Shoulder Exercises: ROM/Strengthening   UBE (Upper Arm Bike) 2 min forward/2 min backward  while discussing status    Other ROM/Strengthening Exercises red TB around wrists wall walk Lt/Rt 3x10 reps     Other ROM/Strengthening Exercises counter top push ups 10x                   PT Education - 01/01/20 1054    Education Details prone shoulder horizontal abduction;  counter push ups    Person(s) Educated Patient    Methods Explanation;Demonstration;Handout    Comprehension Returned demonstration;Verbalized understanding            PT Short Term Goals - 01/01/20 1555      PT SHORT TERM GOAL #1   Title The patient will demonstrate knowledge of basic HEP for ROM and initial strengthening    Status Achieved      PT SHORT TERM GOAL #2   Title  The patient will report a 30% reduction in right shoulder pain with reaching overhead and with morning discomfort    Status Achieved      PT SHORT TERM GOAL #3   Title Right shoulder flexion to 153 degrees and abduction to 160 needed for reaching shelves overhead    Status Achieved             PT Long Term Goals - 01/01/20 1555      PT LONG TERM GOAL #1   Title The patient will be independent in safe self progression of HEP    Status Achieved      PT LONG TERM GOAL #2   Title The patient will report a 60% reduction in right shoulder pain in the morningtime and with reaching overhead    Status Partially Met      PT LONG TERM GOAL #3   Title The patient will have 4+/5 glenohumeral and scapular strength needed for lifting items on/off the shelves in the fridge    Status Partially Met      PT LONG TERM GOAL #4   Title FOTO functional outcome score improved from 50% to 41% indicating improved function with less pain    Status Achieved                 Plan - 01/01/20 1037    Clinical Impression Statement The patient reports variable pain but rates her overall progress at 50%.  She states the intensity and frequency of pain are better and she is able to do more.  Her FOTO functional outcome score has improved from 50% limited to 41%.  Her shoulder ROM is within 3 degrees of unaffected shoulder.  She has been instructed in a comprehensive HEP and  should make further gains in strength with continuation of this program.  She has met the majority of rehab goals and expresses readiness for discharge from PT at this time.    PT Home Exercise Plan [id number]           Patient will benefit from skilled therapeutic intervention in order to improve the following deficits and impairments:     Visit Diagnosis: Chronic right shoulder pain  Stiffness of right shoulder, not elsewhere classified  Muscle weakness (generalized)    PHYSICAL THERAPY  DISCHARGE SUMMARY  Visits from Start  of Care: 8  Current functional level related to goals / functional outcomes: See clinical impressions above   Remaining deficits: As above   Education / Equipment: HEP Plan: Patient agrees to discharge.  Patient goals were partially met. Patient is being discharged due to meeting the stated rehab goals.  ?????      Problem List Patient Active Problem List   Diagnosis Date Noted   Impingement syndrome of right shoulder 09/11/2019   Cervicogenic headache 07/19/2018   Laryngopharyngeal reflux (LPR) 06/18/2014   Hypokalemia 06/18/2014   Palpitations 06/18/2014   Anxiety state 05/24/2014   Atypical chest pain 04/22/2014   Cough 04/09/2014   Chest pain 03/14/2014   Neck and shoulder pain 12/07/2013   AC (acromioclavicular) arthritis 11/12/2013   Acute onset of severe vertigo 09/20/2013   Essential hypertension 09/20/2013   Medication management 09/20/2013   Subacromial bursitis 06/22/2013   Pain in right shoulder 06/05/2013   Arm pain, right 06/05/2013   Acid reflux 05/04/2013   Hyperglycemia 05/04/2013   TGA (transient global amnesia) 04/25/2013   Amnesia 04/15/2013   Neck pain 03/09/2013   Laryngitis from reflux of stomach acid- ? 01/01/2013   Welcome to Medicare preventive visit 05/01/2012   Leg cramp 05/01/2012   Intermittent lightheadedness 04/03/2012   Sinusitis 04/03/2012   Visit for preventive health examination 07/18/2011   Constipation 07/18/2011   Hypertension 07/18/2011   Urgency of urination 04/06/2011   Sore throat 06/11/2010   History of recurrent UTIs 06/11/2010   OSTEOPENIA 05/18/2009   Urticaria 07/10/2007   HYPERLIPIDEMIA 10/28/2006   ANXIETY 10/28/2006   DEPRESSION 10/28/2006   Migraine 10/28/2006   GERD 10/28/2006   PMS 10/28/2006   Ruben Im, PT 01/01/20 3:57 PM Phone: [phone] Fax: [phone] Alvera Singh 01/01/2020, 3:56 PM  [facility] [address], [place], [place], [address] Phone: [phone]   Fax:  [phone]  Name: Brianna Harper MRN: [id number] Date of Birth: 12-Nov-1945

## 2020-01-01 NOTE — Patient Instructions (Signed)
Access Code: [email or web address] URL: [email or web address] Date: 01/01/2020 Prepared by: Ruben Im  Exercises Seated Upper Trapezius Stretch - 1 x daily - 7 x weekly - 1 sets - 3 reps - 30 hold Shoulder extension with resistance - Neutral - 1 x daily - 7 x weekly - 1 sets - 10 reps Standing Tricep Extensions with Resistance - 1 x daily - 7 x weekly - 1 sets - 10 reps Shoulder External Rotation with Anchored Resistance - 1 x daily - 7 x weekly - 1 sets - 10 reps Shoulder Internal Rotation with Resistance - 1 x daily - 7 x weekly - 1 sets - 10 reps Median Nerve Flossing - 1 x daily - 7 x weekly - 1 sets - 1 reps Wall Push Up - 1 x daily - 7 x weekly - 1 sets - 10 reps Wall walk - 1 x daily - 7 x weekly - 3 sets - 10 reps Standing Bent Over Single Arm Scapular Row with Table Support with PLB - 1 x daily - 7 x weekly - 2 sets - 10 reps Single Arm Bent Over Shoulder Extension with Dumbbell - 1 x daily - 7 x weekly - 1 sets - 10 reps Prone Shoulder Horizontal Abduction - 1 x daily - 7 x weekly - 1 sets - 10 reps Push Up on Table - 1 x daily - 7 x weekly - 1 sets - 10 reps

## 2020-01-07 DIAGNOSIS — E039 Hypothyroidism, unspecified: Secondary | ICD-10-CM | POA: Diagnosis not present

## 2020-01-09 DIAGNOSIS — Z23 Encounter for immunization: Secondary | ICD-10-CM | POA: Diagnosis not present

## 2020-01-10 ENCOUNTER — Encounter: Payer: Self-pay | Admitting: Internal Medicine

## 2020-01-10 ENCOUNTER — Ambulatory Visit (INDEPENDENT_AMBULATORY_CARE_PROVIDER_SITE_OTHER): Payer: Medicare Other | Admitting: Internal Medicine

## 2020-01-10 VITALS — BP 134/72 | HR 80 | Ht 65.0 in | Wt 139.0 lb

## 2020-01-10 DIAGNOSIS — R053 Chronic cough: Secondary | ICD-10-CM | POA: Diagnosis not present

## 2020-01-10 DIAGNOSIS — K219 Gastro-esophageal reflux disease without esophagitis: Secondary | ICD-10-CM | POA: Diagnosis not present

## 2020-01-10 MED ORDER — OMEPRAZOLE 40 MG PO CPDR: 40.0000 mg | DELAYED_RELEASE_CAPSULE | Freq: Every day | ORAL | 3 refills | Status: DC

## 2020-01-10 NOTE — Patient Instructions (Addendum)
We have sent the following medications to your pharmacy for you to pick up at your convenience: Omeprazole  Please make an appointment with Dr Joya Gaskins at [facility]   Cough suppression  Take delsym two tsp every 12 hours and supplement if needed with  tramadol 50 mg up to 2 every 4 hours to suppress the urge to cough. Swallowing water and/or using ice chips/non mint and menthol containing candies (such as lifesavers or jolly ranchers) are also effective.  You should rest your voice and avoid activities that you know make you cough.  Once you have eliminated the cough for 3 straight days try reducing the tramadol first,  then the delsym as tolerated.    I appreciate the opportunity to care for you. Silvano Rusk, MD, [facility]

## 2020-01-11 ENCOUNTER — Encounter: Payer: Self-pay | Admitting: Internal Medicine

## 2020-01-11 NOTE — Progress Notes (Signed)
Aalaiyah Yassin Longley 74 y.o. 03/27/1946 [id number]  Assessment & Plan:   Encounter Diagnoses  Name Primary?  . Laryngopharyngeal reflux (LPR) Yes  . Chronic cough     Her symptoms are not new they are just recurrent.  We talked about that today.  She has responded to PPI in the past.  We reviewed the potential issues with taking a PPI and thyroid replacement.  It is my understanding that acid is necessary for appropriate absorption of the thyroid medication.  In general if you take the PPI a few hours after the thyroid replacement or perhaps as little as an hour afterward you should be okay.  She will take 40 mg omeprazole 30 minutes before a meal daily.  I have encouraged her to schedule a follow-up with Dr. Joya Gaskins at [facility] given her concerns about her symptoms and he could examine her larynx again.  I did explain she could need higher dose more frequent PPI as well potentially depending upon the clinical course.  I will see her back as needed at this point.  I appreciate the opportunity to care for this patient. CC: Avva, Ravisankar, MD  Subjective:   Chief Complaint: Cough  HPI Keiara is a 74 year old white woman with a history of GERD but also with LPR diagnosis, functional GI disturbances and ACE inhibitor induced cough.  She is complaining of a cough that is intermittently productive of some white sputum.  She had seen Dr. Dagmar Hait who pronounced her lungs clear and thought this might be due to reflux.  PPI was recommended but she is also starting thyroid supplementation and there have been concerns about the timing of taking the thyroid supplementation and a PPI due to possible interaction and malabsorption.  She continued to have this problem with cough.  She denies overt postnasal drip or drainage.  No clear allergy symptoms.  She has seen ENT in the past both Dr. Lorelee Cover and then was referred over to Dr. Joya Gaskins at the voice center at [facility].  I reviewed those notes with her.  She had  some interarytenoid edema and was thought to have LPR.  She improved on PPI in the past.  It seems like she did not recall this in detail.  When reminded it did come back to her.  She had tried Atrovent nasal spray without success and Dr. Joya Gaskins attributed her problems to LPR.  She is concerned about something bad going on.  There does not seem to be significant hoarseness.  She does not have classic heartburn.  She recently started taking the omeprazole at 20 mg before supper to see if that helps as she frequently has nocturnal symptoms despite being compliant with her reflux diet and timing of going to bed etc.   Last upper endoscopy July 2020 normal.  Indications were epigastric pain and weight loss.   Wt Readings from Last 3 Encounters:  01/10/20 139 lb (63 kg)  09/11/19 130 lb (59 kg)  08/08/19 130 lb (59 kg)    Allergies  Allergen Reactions  . Lisinopril Cough  . Erythromycin Ethylsuccinate Other (See Comments)    REACTION: GI upset  . Nitrofurantoin Hives and Swelling    macrobid   . Cymbalta [Duloxetine Hcl]     nausea  . Gabapentin     Bleeding gums  . Lidocaine     Unable to breath   . Lyrica [Pregabalin]     Bleeding gums  . Tizanidine     Hypotension   .  Sulfadiazine Itching and Rash   Current Meds  Medication Sig  . baclofen (LIORESAL) 10 MG tablet Take 1 tablet (10 mg total) by mouth 3 (three) times daily. 1 tab tid (Patient taking differently: Take 10 mg by mouth 2 (two) times daily. 1 tab tid)  . cloNIDine (CATAPRES) 0.1 MG tablet Take 0.1 mg by mouth. PRN  . doxepin (SINEQUAN) 10 MG capsule Take 10 mg by mouth at bedtime.   Arna Medici 25 MCG tablet Take 1 tablet by mouth daily.  . metoprolol tartrate (LOPRESSOR) 25 MG tablet Take 1 tablet (25 mg total) by mouth 2 (two) times daily.  . Multiple Vitamin (MULTIVITAMIN) capsule Take 1 capsule by mouth daily.  Marland Kitchen VERAPAMIL HCL ER, CO, PO Take 240 mg by mouth.   . [DISCONTINUED] omeprazole (PRILOSEC) 20 MG capsule  Take 1 capsule (20 mg total) by mouth daily.   Past Medical History:  Diagnosis Date  . Anxiety    no per pt  . 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 Cervicogenic headache 07/19/2018  . Depression   . GERD (gastroesophageal reflux disease)   . Hyperlipidemia   . Hypertension   . Laryngitis   . Migraine    "not very often anymore; they were more menopausal" (04/16/2013)  . Osteopenia    dexa -2.0 hip 2008  . Rapid heart rate    hx of    Past Surgical History:  Procedure Laterality Date  . COLONOSCOPY  02/03/2004   internal hemorrhoids  . IR RADIOLOGIST EVAL & MGMT  04/12/2019  . UPPER GASTROINTESTINAL ENDOSCOPY  08/29/2008   gastritis, hiatal hernia   Social History   Social History Narrative   HHof 2    Non smoker   2 children  Also twins that did not survive birth   Married    CB x 2   No pets    family history includes Arthritis in her father; Hypertension in her mother; Migraines in her mother; Ovarian cancer in her maternal grandmother.   Review of Systems As per HPI  Objective:   Physical Exam BP 134/72 (BP Location: Left Arm, Patient Position: Sitting, Cuff Size: Normal)   Pulse 80   Ht 5\' 5"  (1.651 m) Comment: height measured without shoes  Wt 139 lb (63 kg)   BMI 23.13 kg/m   Well-developed well-nourished white woman no acute distress Mouth and posterior pharynx without discrete lesions, the posterior pharynx looks a little erythematous.   Data reviewed include my previous notes procedure reports and ENT notes from [facility]: [facility] W and labs in the EMR

## 2020-01-21 DIAGNOSIS — Z23 Encounter for immunization: Secondary | ICD-10-CM | POA: Diagnosis not present

## 2020-01-29 DIAGNOSIS — R053 Chronic cough: Secondary | ICD-10-CM | POA: Diagnosis not present

## 2020-01-29 DIAGNOSIS — J384 Edema of larynx: Secondary | ICD-10-CM | POA: Diagnosis not present

## 2020-01-29 DIAGNOSIS — K219 Gastro-esophageal reflux disease without esophagitis: Secondary | ICD-10-CM | POA: Diagnosis not present

## 2020-01-29 DIAGNOSIS — R6889 Other general symptoms and signs: Secondary | ICD-10-CM | POA: Diagnosis not present

## 2020-01-29 DIAGNOSIS — R198 Other specified symptoms and signs involving the digestive system and abdomen: Secondary | ICD-10-CM | POA: Diagnosis not present

## 2020-01-29 DIAGNOSIS — Z79899 Other long term (current) drug therapy: Secondary | ICD-10-CM | POA: Diagnosis not present

## 2020-01-29 DIAGNOSIS — J383 Other diseases of vocal cords: Secondary | ICD-10-CM | POA: Diagnosis not present

## 2020-02-07 ENCOUNTER — Ambulatory Visit: Payer: Medicare Other | Admitting: Neurology

## 2020-03-10 DIAGNOSIS — M5481 Occipital neuralgia: Secondary | ICD-10-CM | POA: Diagnosis not present

## 2020-03-10 DIAGNOSIS — G4701 Insomnia due to medical condition: Secondary | ICD-10-CM | POA: Diagnosis not present

## 2020-03-10 DIAGNOSIS — G8929 Other chronic pain: Secondary | ICD-10-CM | POA: Diagnosis not present

## 2020-03-10 DIAGNOSIS — G4486 Cervicogenic headache: Secondary | ICD-10-CM | POA: Diagnosis not present

## 2020-03-10 DIAGNOSIS — G509 Disorder of trigeminal nerve, unspecified: Secondary | ICD-10-CM | POA: Diagnosis not present

## 2020-03-10 DIAGNOSIS — M5412 Radiculopathy, cervical region: Secondary | ICD-10-CM | POA: Diagnosis not present

## 2020-03-11 DIAGNOSIS — I10 Essential (primary) hypertension: Secondary | ICD-10-CM | POA: Diagnosis not present

## 2020-03-11 DIAGNOSIS — R519 Headache, unspecified: Secondary | ICD-10-CM | POA: Diagnosis not present

## 2020-03-11 DIAGNOSIS — E785 Hyperlipidemia, unspecified: Secondary | ICD-10-CM | POA: Diagnosis not present

## 2020-03-11 DIAGNOSIS — K219 Gastro-esophageal reflux disease without esophagitis: Secondary | ICD-10-CM | POA: Diagnosis not present

## 2020-03-11 DIAGNOSIS — G4700 Insomnia, unspecified: Secondary | ICD-10-CM | POA: Diagnosis not present

## 2020-03-11 DIAGNOSIS — E039 Hypothyroidism, unspecified: Secondary | ICD-10-CM | POA: Diagnosis not present

## 2020-03-11 DIAGNOSIS — F419 Anxiety disorder, unspecified: Secondary | ICD-10-CM | POA: Diagnosis not present

## 2020-03-31 ENCOUNTER — Other Ambulatory Visit: Payer: Medicare Other

## 2020-03-31 DIAGNOSIS — Z20822 Contact with and (suspected) exposure to covid-19: Secondary | ICD-10-CM | POA: Diagnosis not present

## 2020-04-01 ENCOUNTER — Other Ambulatory Visit: Payer: Medicare Other

## 2020-04-02 LAB — SARS-COV-2, NAA 2 DAY TAT

## 2020-04-02 LAB — NOVEL CORONAVIRUS, NAA: SARS-CoV-2, NAA: NOT DETECTED

## 2020-04-07 IMAGING — MR MR HEAD WO/W CM
9 of 12 series · 34 of 48 positions shown · IV contrast (gadavist)
Comparison: 06/18/2017 CT head.  06/30/2014 MRI head.

CLINICAL DATA: 72 y/o F; constant headaches at the base of skull
and facial paralysis symptoms for 2.5 months.

EXAM:
MRI HEAD WITHOUT AND WITH CONTRAST
TECHNIQUE: Multiplanar, multiecho pulse sequences of the brain and surrounding
structures were obtained without and with intravenous contrast.
CONTRAST:  6 cc Gadavist

[Series 3: DWI · axial · 3.0mm · 1.09mm/px · z∈[-42,+103]mm · 8 of 104 slices shown (1 of 4)]
[im 1/104]
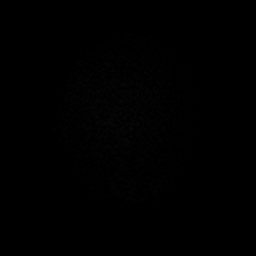
[im 12/104]
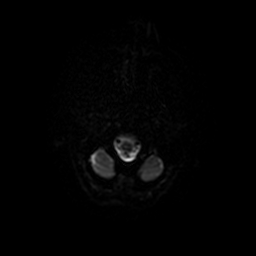
[im 35/104]
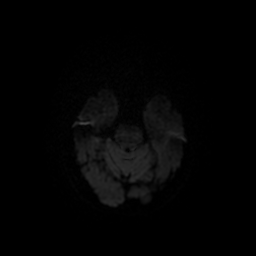
[im 46/104]
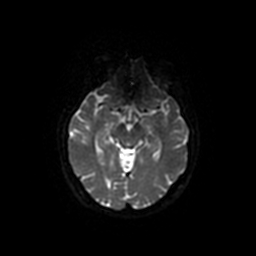
[im 58/104]
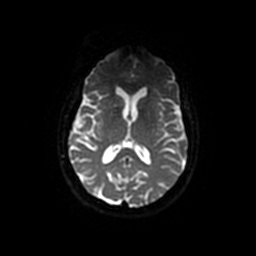
[im 69/104]
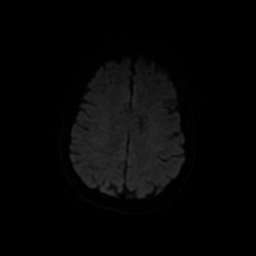
[im 92/104]
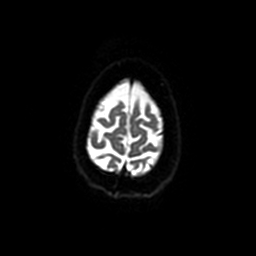
[im 104/104]
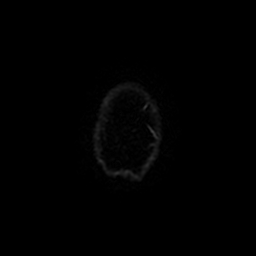

[Series 4: T1 · sagittal · 5.0mm · 0.47mm/px · 2 of 24 slices shown]
[im 1/24]
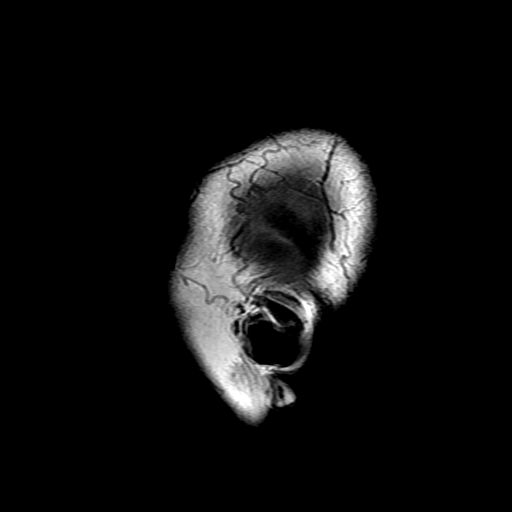
[im 24/24]
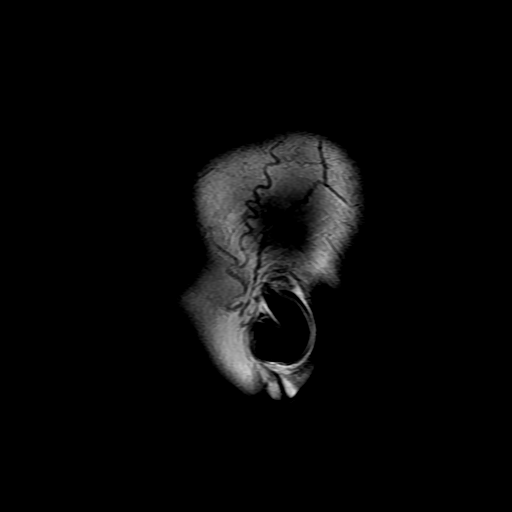

[Series 5: DWI · coronal · 4.0mm · 1.09mm/px · 6 of 70 slices shown (2 of 4)]
[im 1/70]
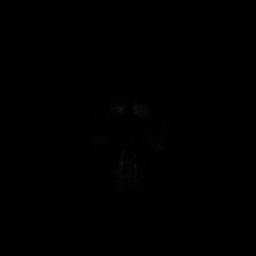
[im 14/70]
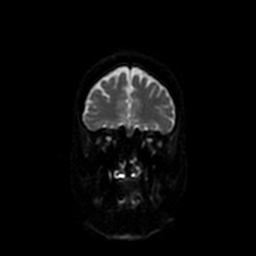
[im 28/70]
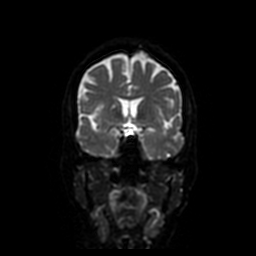
[im 42/70]
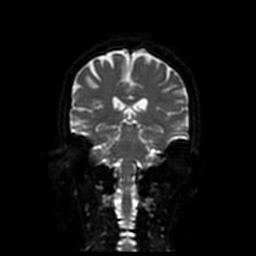
[im 56/70]
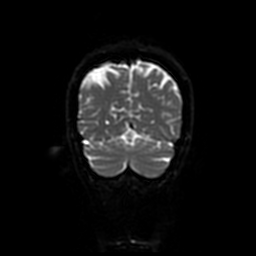
[im 70/70]
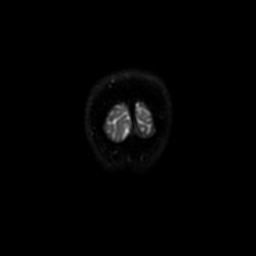

[Series 6: T2 · axial · 5.0mm · 0.43mm/px · z∈[-25,+124]mm · 2 of 23 slices shown]
[im 1/23]
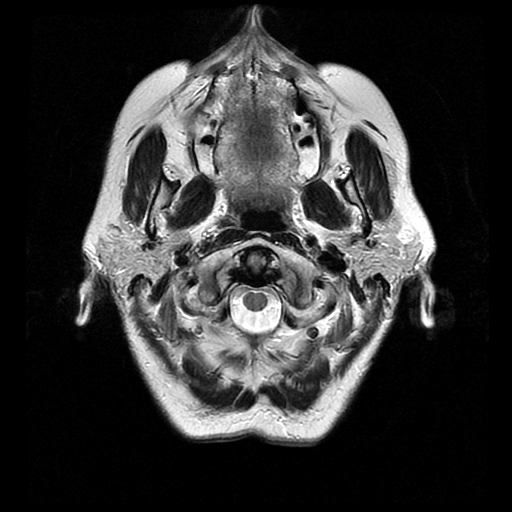
[im 23/23]
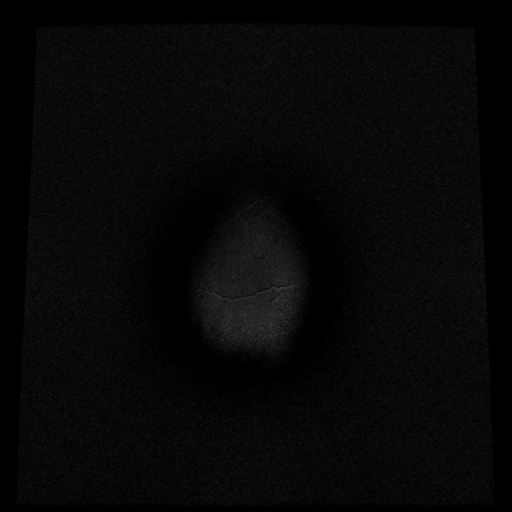

[Series 7: FLAIR · axial · 5.0mm · 0.43mm/px · z∈[-25,+124]mm · 2 of 23 slices shown]
[im 1/23]
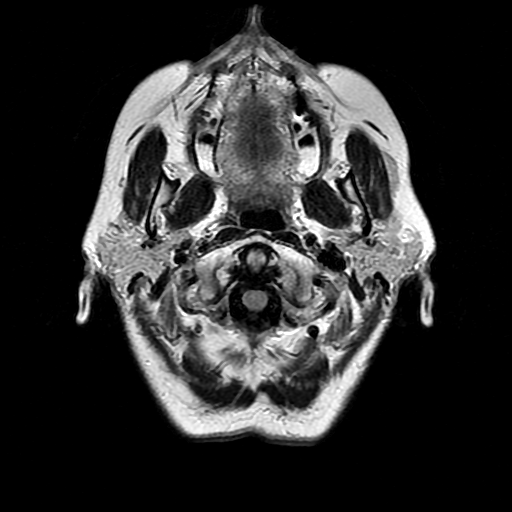
[im 23/23]
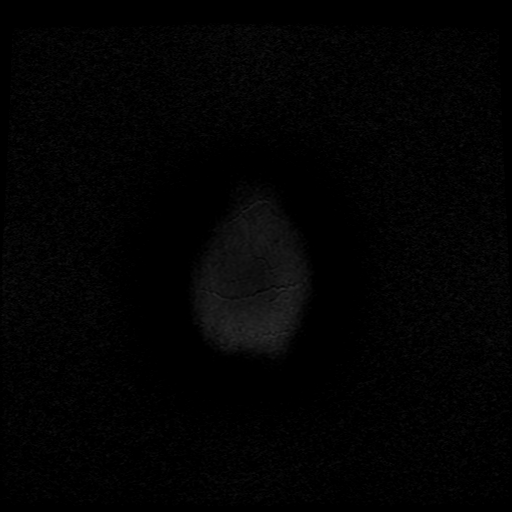

[Series 10: T2 post-contrast · coronal · 5.0mm · 0.45mm/px · 3 of 30 slices shown]
[im 1/30]
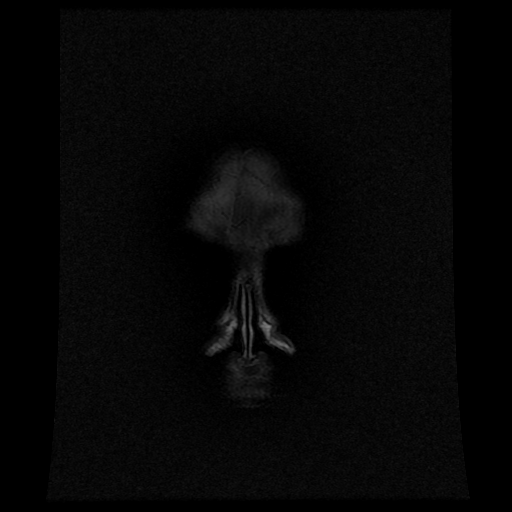
[im 15/30]
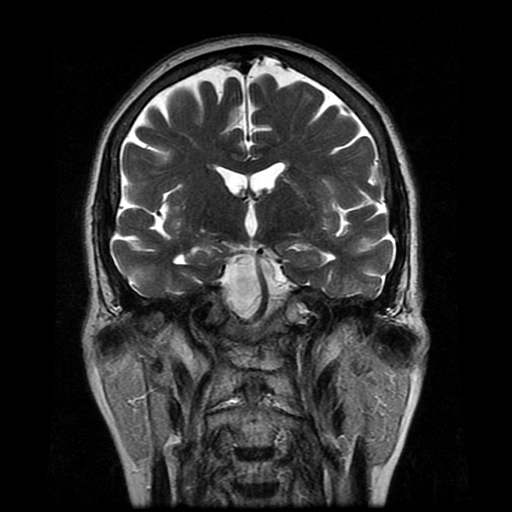
[im 30/30]
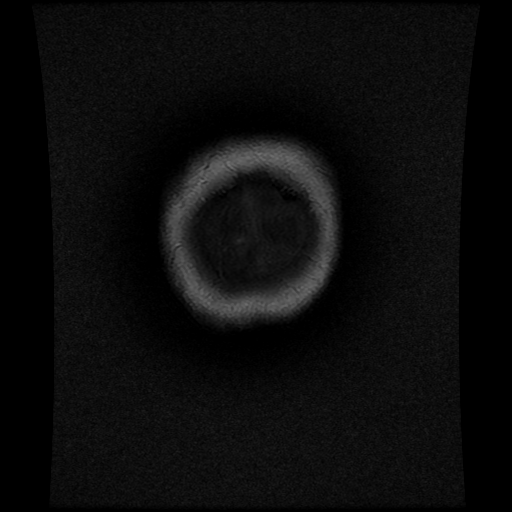

[Series 12: T1 post-contrast · coronal · 5.0mm · 0.45mm/px · 3 of 30 slices shown]
[im 1/30]
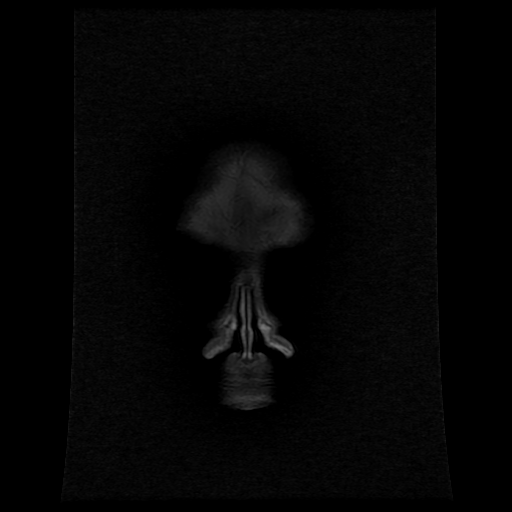
[im 15/30]
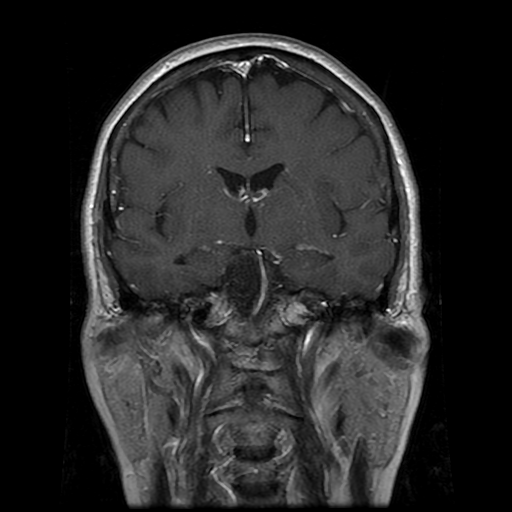
[im 30/30]
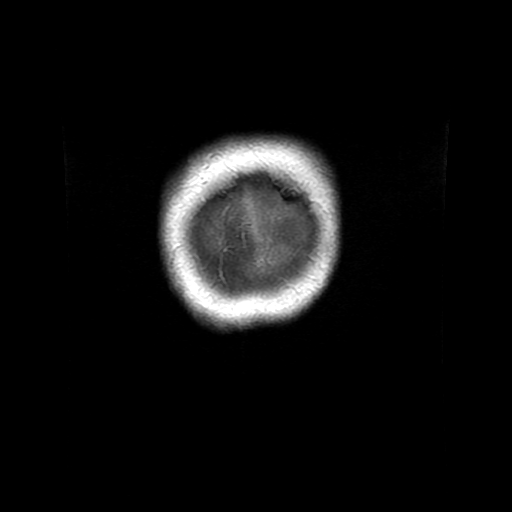

[Series 300: DWI · axial · 3.0mm · 1.09mm/px · z∈[-42,+103]mm · 5 of 52 slices shown (3 of 4)]
[im 1/52]
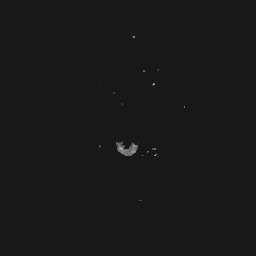
[im 13/52]
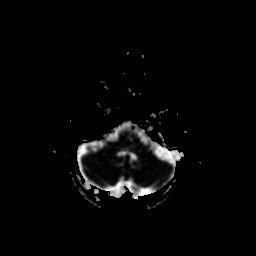
[im 26/52]
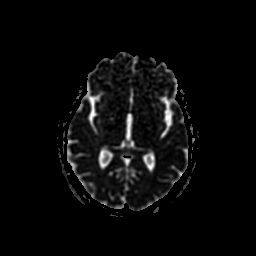
[im 39/52]
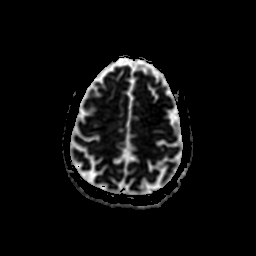
[im 52/52]
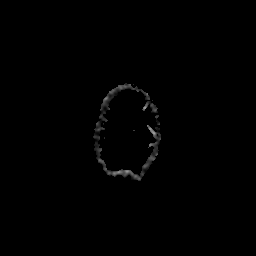

[Series 500: DWI · coronal · 4.0mm · 1.09mm/px · 3 of 35 slices shown (4 of 4)]
[im 1/35]
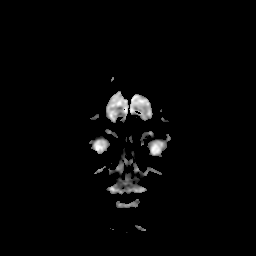
[im 18/35]
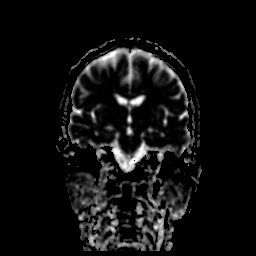
[im 35/35]
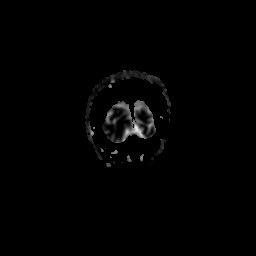

[34 of 48 positions shown; findings below may reference images not displayed]

FINDINGS: Brain: No acute infarction, hemorrhage, hydrocephalus, extra-axial
collection or mass lesion. After administration of intravenous
contrast there is no abnormal enhancement of the brain. No
significant structural or signal abnormality of the brain identified
for age.

Vascular: Persistent central flow voids. Numerous stable arachnoid
granulations within the bilateral transverse sinus systems.

Skull and upper cervical spine: Normal marrow signal.

Sinuses/Orbits: Small left maxillary sinus mucous retention cyst.
Mild bilateral maxillary sinus mucosal thickening. No abnormal
signal of the mastoid air cells.

Other: None.
IMPRESSION: No acute intracranial process or abnormal enhancement identified.
Stable unremarkable MRI of the brain for age.

## 2020-04-17 ENCOUNTER — Telehealth: Payer: Self-pay | Admitting: Internal Medicine

## 2020-04-17 MED ORDER — OMEPRAZOLE 40 MG PO CPDR: 40.0000 mg | DELAYED_RELEASE_CAPSULE | Freq: Every day | ORAL | 3 refills | Status: DC

## 2020-04-17 NOTE — Telephone Encounter (Signed)
Pt is requesting a call back from a nurse, pt states the medication is for 40 mg instead of 20mg  and she is wanting to ensure that is ok to take with her euthyrox.

## 2020-04-17 NOTE — Telephone Encounter (Signed)
Omeprazole sent to [facility] as requested. Left patient message that this has been done.

## 2020-04-17 NOTE — Telephone Encounter (Signed)
Pt states the pharmacv is called [facility].

## 2020-04-17 NOTE — Telephone Encounter (Signed)
Pt is requesting a refill on her omeprazole.  Pt wants it sent to [facility] under a mail order

## 2020-04-17 NOTE — Telephone Encounter (Signed)
Patient's questions answered. The office note from Oct 2021 had the answers she needed.

## 2020-05-15 DIAGNOSIS — I10 Essential (primary) hypertension: Secondary | ICD-10-CM | POA: Diagnosis not present

## 2020-05-15 DIAGNOSIS — R519 Headache, unspecified: Secondary | ICD-10-CM | POA: Diagnosis not present

## 2020-05-15 DIAGNOSIS — G4700 Insomnia, unspecified: Secondary | ICD-10-CM | POA: Diagnosis not present

## 2020-05-15 DIAGNOSIS — E785 Hyperlipidemia, unspecified: Secondary | ICD-10-CM | POA: Diagnosis not present

## 2020-05-15 DIAGNOSIS — E039 Hypothyroidism, unspecified: Secondary | ICD-10-CM | POA: Diagnosis not present

## 2020-05-15 DIAGNOSIS — M79641 Pain in right hand: Secondary | ICD-10-CM | POA: Diagnosis not present

## 2020-05-15 DIAGNOSIS — F419 Anxiety disorder, unspecified: Secondary | ICD-10-CM | POA: Diagnosis not present

## 2020-05-15 DIAGNOSIS — K219 Gastro-esophageal reflux disease without esophagitis: Secondary | ICD-10-CM | POA: Diagnosis not present

## 2020-08-01 DIAGNOSIS — M791 Myalgia, unspecified site: Secondary | ICD-10-CM | POA: Diagnosis not present

## 2020-08-01 DIAGNOSIS — I10 Essential (primary) hypertension: Secondary | ICD-10-CM | POA: Diagnosis not present

## 2020-08-01 DIAGNOSIS — F419 Anxiety disorder, unspecified: Secondary | ICD-10-CM | POA: Diagnosis not present

## 2020-08-01 DIAGNOSIS — M79641 Pain in right hand: Secondary | ICD-10-CM | POA: Diagnosis not present

## 2020-08-01 DIAGNOSIS — E039 Hypothyroidism, unspecified: Secondary | ICD-10-CM | POA: Diagnosis not present

## 2020-08-01 DIAGNOSIS — R519 Headache, unspecified: Secondary | ICD-10-CM | POA: Diagnosis not present

## 2020-08-01 DIAGNOSIS — G4700 Insomnia, unspecified: Secondary | ICD-10-CM | POA: Diagnosis not present

## 2020-09-02 ENCOUNTER — Other Ambulatory Visit: Payer: Self-pay | Admitting: Internal Medicine

## 2020-09-02 DIAGNOSIS — Z1231 Encounter for screening mammogram for malignant neoplasm of breast: Secondary | ICD-10-CM

## 2020-09-08 DIAGNOSIS — M5412 Radiculopathy, cervical region: Secondary | ICD-10-CM | POA: Diagnosis not present

## 2020-09-10 ENCOUNTER — Other Ambulatory Visit: Payer: Self-pay | Admitting: Neurology

## 2020-09-10 DIAGNOSIS — M5412 Radiculopathy, cervical region: Secondary | ICD-10-CM

## 2020-09-12 ENCOUNTER — Other Ambulatory Visit: Payer: Self-pay | Admitting: Neurology

## 2020-09-12 DIAGNOSIS — M5412 Radiculopathy, cervical region: Secondary | ICD-10-CM

## 2020-09-18 DIAGNOSIS — M79641 Pain in right hand: Secondary | ICD-10-CM | POA: Diagnosis not present

## 2020-09-18 DIAGNOSIS — M542 Cervicalgia: Secondary | ICD-10-CM | POA: Diagnosis not present

## 2020-09-18 DIAGNOSIS — M25511 Pain in right shoulder: Secondary | ICD-10-CM | POA: Diagnosis not present

## 2020-09-20 ENCOUNTER — Other Ambulatory Visit: Payer: Self-pay

## 2020-09-20 ENCOUNTER — Ambulatory Visit
Admission: RE | Admit: 2020-09-20 | Discharge: 2020-09-20 | Disposition: A | Discharge status: Home / Self Care | Payer: Medicare Other | Source: Ambulatory Visit | Attending: Neurology | Admitting: Neurology

## 2020-09-20 DIAGNOSIS — M5412 Radiculopathy, cervical region: Secondary | ICD-10-CM

## 2020-09-20 DIAGNOSIS — M4802 Spinal stenosis, cervical region: Secondary | ICD-10-CM | POA: Diagnosis not present

## 2020-10-16 DIAGNOSIS — M25511 Pain in right shoulder: Secondary | ICD-10-CM | POA: Diagnosis not present

## 2020-10-16 DIAGNOSIS — M79641 Pain in right hand: Secondary | ICD-10-CM | POA: Diagnosis not present

## 2020-10-16 DIAGNOSIS — M542 Cervicalgia: Secondary | ICD-10-CM | POA: Diagnosis not present

## 2020-10-29 ENCOUNTER — Inpatient Hospital Stay: Admission: RE | Admit: 2020-10-29 | Payer: Medicare Other | Source: Ambulatory Visit

## 2020-10-29 ENCOUNTER — Other Ambulatory Visit: Payer: Self-pay

## 2020-10-29 ENCOUNTER — Ambulatory Visit
Admission: RE | Admit: 2020-10-29 | Discharge: 2020-10-29 | Disposition: A | Discharge status: Home / Self Care | Payer: Medicare Other | Source: Ambulatory Visit | Attending: Internal Medicine | Admitting: Internal Medicine

## 2020-10-29 DIAGNOSIS — Z1231 Encounter for screening mammogram for malignant neoplasm of breast: Secondary | ICD-10-CM

## 2020-11-20 DIAGNOSIS — M25511 Pain in right shoulder: Secondary | ICD-10-CM | POA: Diagnosis not present

## 2020-11-20 DIAGNOSIS — M542 Cervicalgia: Secondary | ICD-10-CM | POA: Diagnosis not present

## 2020-11-20 DIAGNOSIS — M79641 Pain in right hand: Secondary | ICD-10-CM | POA: Diagnosis not present

## 2020-12-05 DIAGNOSIS — E785 Hyperlipidemia, unspecified: Secondary | ICD-10-CM | POA: Diagnosis not present

## 2020-12-05 DIAGNOSIS — E039 Hypothyroidism, unspecified: Secondary | ICD-10-CM | POA: Diagnosis not present

## 2020-12-12 DIAGNOSIS — Z1331 Encounter for screening for depression: Secondary | ICD-10-CM | POA: Diagnosis not present

## 2020-12-12 DIAGNOSIS — E039 Hypothyroidism, unspecified: Secondary | ICD-10-CM | POA: Diagnosis not present

## 2020-12-12 DIAGNOSIS — Z1339 Encounter for screening examination for other mental health and behavioral disorders: Secondary | ICD-10-CM | POA: Diagnosis not present

## 2020-12-12 DIAGNOSIS — R82998 Other abnormal findings in urine: Secondary | ICD-10-CM | POA: Diagnosis not present

## 2020-12-12 DIAGNOSIS — E785 Hyperlipidemia, unspecified: Secondary | ICD-10-CM | POA: Diagnosis not present

## 2020-12-12 DIAGNOSIS — Z23 Encounter for immunization: Secondary | ICD-10-CM | POA: Diagnosis not present

## 2020-12-12 DIAGNOSIS — F419 Anxiety disorder, unspecified: Secondary | ICD-10-CM | POA: Diagnosis not present

## 2020-12-12 DIAGNOSIS — Z1212 Encounter for screening for malignant neoplasm of rectum: Secondary | ICD-10-CM | POA: Diagnosis not present

## 2020-12-12 DIAGNOSIS — M791 Myalgia, unspecified site: Secondary | ICD-10-CM | POA: Diagnosis not present

## 2020-12-12 DIAGNOSIS — I10 Essential (primary) hypertension: Secondary | ICD-10-CM | POA: Diagnosis not present

## 2020-12-12 DIAGNOSIS — G4700 Insomnia, unspecified: Secondary | ICD-10-CM | POA: Diagnosis not present

## 2020-12-12 DIAGNOSIS — Z0000 Encounter for general adult medical examination without abnormal findings: Secondary | ICD-10-CM | POA: Diagnosis not present

## 2020-12-15 ENCOUNTER — Other Ambulatory Visit: Payer: Self-pay | Admitting: Internal Medicine

## 2020-12-15 DIAGNOSIS — E785 Hyperlipidemia, unspecified: Secondary | ICD-10-CM

## 2020-12-25 ENCOUNTER — Other Ambulatory Visit: Payer: Medicare Other

## 2021-02-11 ENCOUNTER — Other Ambulatory Visit: Payer: Medicare Other

## 2021-02-23 ENCOUNTER — Other Ambulatory Visit: Payer: Self-pay

## 2021-02-23 ENCOUNTER — Ambulatory Visit
Admission: RE | Admit: 2021-02-23 | Discharge: 2021-02-23 | Disposition: A | Discharge status: Home / Self Care | Payer: No Typology Code available for payment source | Source: Ambulatory Visit | Attending: Internal Medicine | Admitting: Internal Medicine

## 2021-02-23 DIAGNOSIS — E785 Hyperlipidemia, unspecified: Secondary | ICD-10-CM

## 2021-02-23 DIAGNOSIS — Z23 Encounter for immunization: Secondary | ICD-10-CM | POA: Diagnosis not present

## 2021-03-04 DIAGNOSIS — H524 Presbyopia: Secondary | ICD-10-CM | POA: Diagnosis not present

## 2021-03-04 DIAGNOSIS — H5203 Hypermetropia, bilateral: Secondary | ICD-10-CM | POA: Diagnosis not present

## 2021-03-04 DIAGNOSIS — H04123 Dry eye syndrome of bilateral lacrimal glands: Secondary | ICD-10-CM | POA: Diagnosis not present

## 2021-03-04 DIAGNOSIS — H2513 Age-related nuclear cataract, bilateral: Secondary | ICD-10-CM | POA: Diagnosis not present

## 2021-03-18 DIAGNOSIS — I10 Essential (primary) hypertension: Secondary | ICD-10-CM | POA: Diagnosis not present

## 2021-03-24 DIAGNOSIS — I10 Essential (primary) hypertension: Secondary | ICD-10-CM | POA: Diagnosis not present

## 2021-04-08 DIAGNOSIS — I788 Other diseases of capillaries: Secondary | ICD-10-CM | POA: Diagnosis not present

## 2021-04-08 DIAGNOSIS — D1801 Hemangioma of skin and subcutaneous tissue: Secondary | ICD-10-CM | POA: Diagnosis not present

## 2021-04-08 DIAGNOSIS — L812 Freckles: Secondary | ICD-10-CM | POA: Diagnosis not present

## 2021-04-08 DIAGNOSIS — L853 Xerosis cutis: Secondary | ICD-10-CM | POA: Diagnosis not present

## 2021-04-08 DIAGNOSIS — D2272 Melanocytic nevi of left lower limb, including hip: Secondary | ICD-10-CM | POA: Diagnosis not present

## 2021-04-08 DIAGNOSIS — L821 Other seborrheic keratosis: Secondary | ICD-10-CM | POA: Diagnosis not present

## 2021-04-08 DIAGNOSIS — D2271 Melanocytic nevi of right lower limb, including hip: Secondary | ICD-10-CM | POA: Diagnosis not present

## 2021-04-08 DIAGNOSIS — L400 Psoriasis vulgaris: Secondary | ICD-10-CM | POA: Diagnosis not present

## 2021-04-14 DIAGNOSIS — R0981 Nasal congestion: Secondary | ICD-10-CM | POA: Diagnosis not present

## 2021-04-14 DIAGNOSIS — Z20828 Contact with and (suspected) exposure to other viral communicable diseases: Secondary | ICD-10-CM | POA: Diagnosis not present

## 2021-04-14 DIAGNOSIS — R051 Acute cough: Secondary | ICD-10-CM | POA: Diagnosis not present

## 2021-04-14 DIAGNOSIS — B349 Viral infection, unspecified: Secondary | ICD-10-CM | POA: Diagnosis not present

## 2021-04-14 DIAGNOSIS — Z1152 Encounter for screening for COVID-19: Secondary | ICD-10-CM | POA: Diagnosis not present

## 2021-04-14 DIAGNOSIS — I10 Essential (primary) hypertension: Secondary | ICD-10-CM | POA: Diagnosis not present

## 2021-04-14 DIAGNOSIS — J309 Allergic rhinitis, unspecified: Secondary | ICD-10-CM | POA: Diagnosis not present

## 2021-04-14 DIAGNOSIS — J029 Acute pharyngitis, unspecified: Secondary | ICD-10-CM | POA: Diagnosis not present

## 2021-06-05 DIAGNOSIS — H10412 Chronic giant papillary conjunctivitis, left eye: Secondary | ICD-10-CM | POA: Diagnosis not present

## 2021-06-05 DIAGNOSIS — H2513 Age-related nuclear cataract, bilateral: Secondary | ICD-10-CM | POA: Diagnosis not present

## 2021-06-05 DIAGNOSIS — H02831 Dermatochalasis of right upper eyelid: Secondary | ICD-10-CM | POA: Diagnosis not present

## 2021-06-05 DIAGNOSIS — H5712 Ocular pain, left eye: Secondary | ICD-10-CM | POA: Diagnosis not present

## 2021-06-10 DIAGNOSIS — H5712 Ocular pain, left eye: Secondary | ICD-10-CM | POA: Diagnosis not present

## 2021-06-10 DIAGNOSIS — H2000 Unspecified acute and subacute iridocyclitis: Secondary | ICD-10-CM | POA: Diagnosis not present

## 2021-06-10 DIAGNOSIS — H04123 Dry eye syndrome of bilateral lacrimal glands: Secondary | ICD-10-CM | POA: Diagnosis not present

## 2021-06-10 DIAGNOSIS — H2513 Age-related nuclear cataract, bilateral: Secondary | ICD-10-CM | POA: Diagnosis not present

## 2021-06-12 DIAGNOSIS — F419 Anxiety disorder, unspecified: Secondary | ICD-10-CM | POA: Diagnosis not present

## 2021-06-12 DIAGNOSIS — R519 Headache, unspecified: Secondary | ICD-10-CM | POA: Diagnosis not present

## 2021-06-12 DIAGNOSIS — G4700 Insomnia, unspecified: Secondary | ICD-10-CM | POA: Diagnosis not present

## 2021-06-12 DIAGNOSIS — M791 Myalgia, unspecified site: Secondary | ICD-10-CM | POA: Diagnosis not present

## 2021-06-12 DIAGNOSIS — M79641 Pain in right hand: Secondary | ICD-10-CM | POA: Diagnosis not present

## 2021-06-12 DIAGNOSIS — K219 Gastro-esophageal reflux disease without esophagitis: Secondary | ICD-10-CM | POA: Diagnosis not present

## 2021-06-12 DIAGNOSIS — E039 Hypothyroidism, unspecified: Secondary | ICD-10-CM | POA: Diagnosis not present

## 2021-06-12 DIAGNOSIS — I10 Essential (primary) hypertension: Secondary | ICD-10-CM | POA: Diagnosis not present

## 2021-06-12 DIAGNOSIS — E785 Hyperlipidemia, unspecified: Secondary | ICD-10-CM | POA: Diagnosis not present

## 2021-08-03 DIAGNOSIS — H5712 Ocular pain, left eye: Secondary | ICD-10-CM | POA: Diagnosis not present

## 2021-08-03 DIAGNOSIS — H10412 Chronic giant papillary conjunctivitis, left eye: Secondary | ICD-10-CM | POA: Diagnosis not present

## 2021-09-22 ENCOUNTER — Other Ambulatory Visit: Payer: Self-pay | Admitting: Internal Medicine

## 2021-09-22 DIAGNOSIS — Z1231 Encounter for screening mammogram for malignant neoplasm of breast: Secondary | ICD-10-CM

## 2021-10-30 ENCOUNTER — Ambulatory Visit
Admission: RE | Admit: 2021-10-30 | Discharge: 2021-10-30 | Disposition: A | Discharge status: Home / Self Care | Payer: Medicare Other | Source: Ambulatory Visit | Attending: Internal Medicine | Admitting: Internal Medicine

## 2021-10-30 DIAGNOSIS — Z1231 Encounter for screening mammogram for malignant neoplasm of breast: Secondary | ICD-10-CM | POA: Diagnosis not present

## 2021-12-23 DIAGNOSIS — R7989 Other specified abnormal findings of blood chemistry: Secondary | ICD-10-CM | POA: Diagnosis not present

## 2021-12-23 DIAGNOSIS — E785 Hyperlipidemia, unspecified: Secondary | ICD-10-CM | POA: Diagnosis not present

## 2021-12-23 DIAGNOSIS — Z1212 Encounter for screening for malignant neoplasm of rectum: Secondary | ICD-10-CM | POA: Diagnosis not present

## 2021-12-23 DIAGNOSIS — E039 Hypothyroidism, unspecified: Secondary | ICD-10-CM | POA: Diagnosis not present

## 2021-12-23 DIAGNOSIS — I10 Essential (primary) hypertension: Secondary | ICD-10-CM | POA: Diagnosis not present

## 2021-12-23 DIAGNOSIS — F419 Anxiety disorder, unspecified: Secondary | ICD-10-CM | POA: Diagnosis not present

## 2021-12-25 DIAGNOSIS — R82998 Other abnormal findings in urine: Secondary | ICD-10-CM | POA: Diagnosis not present

## 2021-12-25 DIAGNOSIS — Z0000 Encounter for general adult medical examination without abnormal findings: Secondary | ICD-10-CM | POA: Diagnosis not present

## 2021-12-25 DIAGNOSIS — K219 Gastro-esophageal reflux disease without esophagitis: Secondary | ICD-10-CM | POA: Diagnosis not present

## 2021-12-25 DIAGNOSIS — E039 Hypothyroidism, unspecified: Secondary | ICD-10-CM | POA: Diagnosis not present

## 2021-12-25 DIAGNOSIS — F419 Anxiety disorder, unspecified: Secondary | ICD-10-CM | POA: Diagnosis not present

## 2021-12-25 DIAGNOSIS — Z23 Encounter for immunization: Secondary | ICD-10-CM | POA: Diagnosis not present

## 2021-12-25 DIAGNOSIS — R519 Headache, unspecified: Secondary | ICD-10-CM | POA: Diagnosis not present

## 2021-12-25 DIAGNOSIS — I10 Essential (primary) hypertension: Secondary | ICD-10-CM | POA: Diagnosis not present

## 2021-12-25 DIAGNOSIS — M79641 Pain in right hand: Secondary | ICD-10-CM | POA: Diagnosis not present

## 2021-12-25 DIAGNOSIS — E785 Hyperlipidemia, unspecified: Secondary | ICD-10-CM | POA: Diagnosis not present

## 2021-12-25 DIAGNOSIS — M791 Myalgia, unspecified site: Secondary | ICD-10-CM | POA: Diagnosis not present

## 2022-01-22 DIAGNOSIS — Z23 Encounter for immunization: Secondary | ICD-10-CM | POA: Diagnosis not present

## 2022-01-26 DIAGNOSIS — H02834 Dermatochalasis of left upper eyelid: Secondary | ICD-10-CM | POA: Diagnosis not present

## 2022-01-26 DIAGNOSIS — H04563 Stenosis of bilateral lacrimal punctum: Secondary | ICD-10-CM | POA: Diagnosis not present

## 2022-01-26 DIAGNOSIS — H0279 Other degenerative disorders of eyelid and periocular area: Secondary | ICD-10-CM | POA: Diagnosis not present

## 2022-01-26 DIAGNOSIS — H57813 Brow ptosis, bilateral: Secondary | ICD-10-CM | POA: Diagnosis not present

## 2022-01-26 DIAGNOSIS — H02835 Dermatochalasis of left lower eyelid: Secondary | ICD-10-CM | POA: Diagnosis not present

## 2022-01-26 DIAGNOSIS — H02832 Dermatochalasis of right lower eyelid: Secondary | ICD-10-CM | POA: Diagnosis not present

## 2022-01-26 DIAGNOSIS — H0289 Other specified disorders of eyelid: Secondary | ICD-10-CM | POA: Diagnosis not present

## 2022-01-26 DIAGNOSIS — H04213 Epiphora due to excess lacrimation, bilateral lacrimal glands: Secondary | ICD-10-CM | POA: Diagnosis not present

## 2022-01-26 DIAGNOSIS — H02413 Mechanical ptosis of bilateral eyelids: Secondary | ICD-10-CM | POA: Diagnosis not present

## 2022-01-26 DIAGNOSIS — H02132 Senile ectropion of right lower eyelid: Secondary | ICD-10-CM | POA: Diagnosis not present

## 2022-01-26 DIAGNOSIS — H02135 Senile ectropion of left lower eyelid: Secondary | ICD-10-CM | POA: Diagnosis not present

## 2022-01-26 DIAGNOSIS — H02831 Dermatochalasis of right upper eyelid: Secondary | ICD-10-CM | POA: Diagnosis not present

## 2022-02-15 DIAGNOSIS — F419 Anxiety disorder, unspecified: Secondary | ICD-10-CM | POA: Diagnosis not present

## 2022-02-15 DIAGNOSIS — R1030 Lower abdominal pain, unspecified: Secondary | ICD-10-CM | POA: Diagnosis not present

## 2022-02-15 DIAGNOSIS — K219 Gastro-esophageal reflux disease without esophagitis: Secondary | ICD-10-CM | POA: Diagnosis not present

## 2022-02-15 DIAGNOSIS — I10 Essential (primary) hypertension: Secondary | ICD-10-CM | POA: Diagnosis not present

## 2022-02-15 DIAGNOSIS — K582 Mixed irritable bowel syndrome: Secondary | ICD-10-CM | POA: Diagnosis not present

## 2022-02-15 DIAGNOSIS — E039 Hypothyroidism, unspecified: Secondary | ICD-10-CM | POA: Diagnosis not present

## 2022-04-12 ENCOUNTER — Telehealth: Payer: Self-pay | Admitting: Internal Medicine

## 2022-04-12 NOTE — Telephone Encounter (Signed)
Patient is calling wishing to speak with a nurse regarding some issues she states she needs to resolve before her appt with Dr. Carlean Purl. Please advise

## 2022-04-12 NOTE — Telephone Encounter (Signed)
Pt stated that she is having issues with Constipation: Last BM this AM was small.  Pt was recommended to try the MiraLAX twice daily until a large BM and then once a day.  Pt verbalized understanding with all questions answered.

## 2022-04-15 NOTE — Telephone Encounter (Signed)
Patient called requesting to speak with you, please advise.

## 2022-04-15 NOTE — Telephone Encounter (Signed)
Pt stated that she took the Miralax twice daily Monday and Tuesday and on Wednesday pt stated that she had multiple large BM. Pt stated that she is feeling bloated and gassy now and requesting advise. Pt was notified that she could take some over the counter Gas X and cut back on the miraLAX to once a day or every other day.  Pt verbalized understanding with all questions answered.

## 2022-05-04 DIAGNOSIS — K080 Exfoliation of teeth due to systemic causes: Secondary | ICD-10-CM | POA: Diagnosis not present

## 2022-05-06 ENCOUNTER — Encounter: Payer: Self-pay | Admitting: Internal Medicine

## 2022-05-06 ENCOUNTER — Ambulatory Visit: Payer: Medicare Other | Admitting: Internal Medicine

## 2022-05-06 ENCOUNTER — Other Ambulatory Visit (INDEPENDENT_AMBULATORY_CARE_PROVIDER_SITE_OTHER): Payer: Medicare Other

## 2022-05-06 VITALS — BP 134/84 | HR 71 | Ht 66.0 in | Wt 137.0 lb

## 2022-05-06 DIAGNOSIS — R194 Change in bowel habit: Secondary | ICD-10-CM

## 2022-05-06 DIAGNOSIS — K5900 Constipation, unspecified: Secondary | ICD-10-CM

## 2022-05-06 LAB — COMPREHENSIVE METABOLIC PANEL
ALT: 10 U/L (ref 0–35)
AST: 16 U/L (ref 0–37)
Albumin: 4.5 g/dL (ref 3.5–5.2)
Alkaline Phosphatase: 42 U/L (ref 39–117)
BUN: 17 mg/dL (ref 6–23)
CO2: 28 mEq/L (ref 19–32)
Calcium: 9.6 mg/dL (ref 8.4–10.5)
Chloride: 103 mEq/L (ref 96–112)
Creatinine, Ser: 1.01 mg/dL (ref 0.40–1.20)
GFR: 54.54 mL/min — ABNORMAL LOW (ref >60.00)
Glucose, Bld: 92 mg/dL (ref 70–99)
Potassium: 4.2 mEq/L (ref 3.5–5.1)
Sodium: 140 mEq/L (ref 135–145)
Total Bilirubin: 1 mg/dL (ref 0.2–1.2)
Total Protein: 6.8 g/dL (ref 6.0–8.3)

## 2022-05-06 LAB — TSH: TSH: 5.76 u[IU]/mL — ABNORMAL HIGH (ref 0.35–5.50)

## 2022-05-06 MED ORDER — HYDROCORTISONE (PERIANAL) 2.5 % EX CREA: 1.0000 | TOPICAL_CREAM | Freq: Two times a day (BID) | CUTANEOUS | 1 refills | Status: AC | PRN

## 2022-05-06 MED ORDER — PLENVU 140 G PO SOLR: 1.0000 | ORAL | 0 refills | Status: DC

## 2022-05-06 NOTE — Progress Notes (Signed)
Brianna Harper 77 y.o. 07-04-1945 [id number]  Assessment & Plan:   Encounter Diagnoses  Name Primary?   Change in bowel habits Yes   Constipation, unspecified constipation type    Statistically most likely an IBS issue but other causes such as colorectal neoplasia not excluded.  Regular regimen of daily MiraLAX 1 dose and Dulcolax 1 every other day.   Diagnostic colonoscopy.The risks and benefits as well as alternatives of endoscopic procedure(s) have been discussed and reviewed. All questions answered. The patient agrees to proceed.  Modified MiraLAX purge day prior to prep day before colonoscopy and then a Plenvu prep for colonoscopy.  Orders Placed This Encounter  Procedures   Comprehensive metabolic panel   TSH   Ambulatory referral to Gastroenterology   CC: Brianna Harper, Ravisankar, MD   Subjective:   Chief Complaint: Constipation plus bloating and abdominal pain  HPI Brianna Harper is a 77 year old white woman with a history of IBS-D, chronic recurrent abdominal pain and LPR/GERD symptoms, presenting with her husband because of constipation problems that developed approximately 3 months ago.  In November of last year she began to lose the urge to defecate.  She struggled with some abdominal pain and issues including some bloating at times.  She tried Dulcolax which worked she took citrate of magnesium which helped and eventually settled in on MiraLAX with the help of primary care.  She called [place] while waiting for this appointment and MiraLAX twice daily was advised which worked better in terms of promoting defecation but caused intense bloating.  At this point she is taking MiraLAX about 3-4 times a week and is having 2 small stools a week.  She thinks the caliber is reduced.  There is no bleeding but her hemorrhoids are swollen and she notices them.  She has to eat smaller meals throughout the day because of the discomfort a normal-sized meal with cause.  There is some mild right lower  quadrant discomfort when pain occurs.  No significant dietary, medication changes or other changes in her life associated with this change in bowel habits.  2017 NL colonoscopy Negative random bxs FREE T4 Reviewed date:06/18/2021 10:01:45 AM Interpretation: Performing Lab: Notes/Report:  FREE T4 0.8 0.8 - 1.8 ng/mL    TSH Reviewed date:07/16/2021 08:22:30 AM Interpretation: Performing Lab: Notes/Report:  TSH 3.39 0.40 - 4.20 uIU/ml Third Generation hsTSH   Allergies  Allergen Reactions   Lisinopril Cough   Erythromycin Ethylsuccinate Other (See Comments)    REACTION: GI upset   Nitrofurantoin Hives and Swelling    macrobid    Cymbalta [Duloxetine Hcl]     nausea   Gabapentin     Bleeding gums   Lidocaine     Unable to breath    Lyrica [Pregabalin]     Bleeding gums   Tizanidine     Hypotension    Sulfadiazine Itching and Rash   Current Meds  Medication Sig   cloNIDine (CATAPRES) 0.1 MG tablet Take 0.1 mg by mouth. PRN   doxepin (SINEQUAN) 10 MG capsule Take 10 mg by mouth at bedtime.    EUTHYROX 25 MCG tablet Take 1 tablet by mouth daily.   metoprolol tartrate (LOPRESSOR) 25 MG tablet Take 1 tablet (25 mg total) by mouth 2 (two) times daily.   Multiple Vitamin (MULTIVITAMIN) capsule Take 1 capsule by mouth daily.   olmesartan (BENICAR) 20 MG tablet Take 20 mg by mouth daily.   VERAPAMIL HCL ER, CO, PO Take 240 mg by mouth.    Past  Medical History:  Diagnosis Date   Anxiety    no per pt   Cancer ([place])    squamous cell- left leg   Cervicogenic headache 07/19/2018   Depression    GERD (gastroesophageal reflux disease)    Hyperlipidemia    Hypertension    Laryngitis    Laryngopharyngeal reflux (LPR)    Migraine    "not very often anymore; they were more menopausal" (04/16/2013)   Osteopenia    dexa -2.0 hip 2008   Rapid heart rate    hx of    Past Surgical History:  Procedure Laterality Date   COLONOSCOPY  02/03/2004   internal hemorrhoids   IR  RADIOLOGIST EVAL & MGMT  04/12/2019   UPPER GASTROINTESTINAL ENDOSCOPY  08/29/2008   gastritis, hiatal hernia   Social History   Social History Narrative   HHof 2    Non smoker   2 children  Also twins that did not survive birth   Married    CB x 2   No pets    family history includes Arthritis in her father; Hypertension in her mother; Migraines in her mother; Ovarian cancer in her maternal grandmother.   Review of Systems  As per HPI Objective:   Physical Exam '@BP'$  134/84   Pulse 71   Ht '5\' 6"'$  (1.676 m)   Wt 137 lb (62.1 kg)   BMI 22.11 kg/m @  General:  NAD Eyes:   anicteric Lungs:  clear Heart::  S1S2 no rubs, murmurs or gallops Abdomen:  soft and mildly tender bilat LQ, BS+ Ext:   no edema, cyanosis or clubbing    Data Reviewed:  As per HPI

## 2022-05-06 NOTE — Patient Instructions (Signed)
Your provider has requested that you go to the basement level for lab work before leaving today. Press "B" on the elevator. The lab is located at the first door on the left as you exit the elevator.  Due to recent changes in healthcare laws, you may see the results of your imaging and laboratory studies on MyChart before your provider has had a chance to review them.  We understand that in some cases there may be results that are confusing or concerning to you. Not all laboratory results come back in the same time frame and the provider may be waiting for multiple results in order to interpret others.  Please give [place] 48 hours in order for your provider to thoroughly review all the results before contacting the office for clarification of your results.   You have been scheduled for a colonoscopy. Please follow written instructions given to you at your visit today.  Please pick up your prep supplies at the pharmacy within the next 1-3 days. If you use inhalers (even only as needed), please bring them with you on the day of your procedure.   Take a dose of Miralax daily and take a Dulcolax every other day.   I appreciate the opportunity to care for you. Silvano Rusk, MD, [facility]

## 2022-05-13 ENCOUNTER — Telehealth: Payer: Self-pay | Admitting: Internal Medicine

## 2022-05-13 NOTE — Telephone Encounter (Signed)
Inbound call from patient, states she was told at her last appointment to take Miralax daily and Dulcolax every other day until her procedure. Patient is calling stating that taking both doses is causing painful cramping and she would like to either lessen the dose or stop to taking the medication. Please advise.

## 2022-05-13 NOTE — Telephone Encounter (Signed)
Please advise a plan Sir, thank you.

## 2022-05-13 NOTE — Telephone Encounter (Signed)
Stop the dulcolax

## 2022-05-14 NOTE — Telephone Encounter (Signed)
I left her a detailed voice mail message to stop the Dulcolax.

## 2022-05-17 ENCOUNTER — Encounter: Payer: Self-pay | Admitting: Internal Medicine

## 2022-05-25 ENCOUNTER — Ambulatory Visit (AMBULATORY_SURGERY_CENTER): Payer: Medicare Other | Admitting: Internal Medicine

## 2022-05-25 ENCOUNTER — Encounter: Payer: Self-pay | Admitting: Internal Medicine

## 2022-05-25 VITALS — BP 153/59 | HR 60 | Temp 98.0°F | Resp 16 | Ht 66.0 in | Wt 137.0 lb

## 2022-05-25 DIAGNOSIS — R194 Change in bowel habit: Secondary | ICD-10-CM

## 2022-05-25 DIAGNOSIS — K5900 Constipation, unspecified: Secondary | ICD-10-CM

## 2022-05-25 MED ORDER — SODIUM CHLORIDE 0.9 % IV SOLN: 500.0000 mL | INTRAVENOUS | Status: DC

## 2022-05-25 NOTE — Op Note (Addendum)
[facility] Patient Name: Brianna Harper Procedure Date: 05/25/2022 2:30 PM MRN: [id number] Endoscopist: Gatha Mayer , MD, [id number] Age: 77 Referring MD:  Date of Birth: 08-Jul-1945 Gender: Female Account #: [id number] Procedure:                Colonoscopy Indications:              Change in bowel habits, Constipation Medicines:                Monitored Anesthesia Care Procedure:                Pre-Anesthesia Assessment:                           - Prior to the procedure, a History and Physical                            was performed, and patient medications and                            allergies were reviewed. The patient's tolerance of                            previous anesthesia was also reviewed. The risks                            and benefits of the procedure and the sedation                            options and risks were discussed with the patient.                            All questions were answered, and informed consent                            was obtained. Prior Anticoagulants: The patient has                            taken no anticoagulant or antiplatelet agents. ASA                            Grade Assessment: II - A patient with mild systemic                            disease. After reviewing the risks and benefits,                            the patient was deemed in satisfactory condition to                            undergo the procedure.                           After obtaining informed consent, the colonoscope  was passed under direct vision. Throughout the                            procedure, the patient's blood pressure, pulse, and                            oxygen saturations were monitored continuously. The                            Olympus [id number]) Colonoscope was                            introduced through the anus and advanced to the the                            cecum, identified  by appendiceal orifice and                            ileocecal valve. The colonoscopy was performed                            without difficulty. The patient tolerated the                            procedure well. The quality of the bowel                            preparation was good. The ileocecal valve,                            appendiceal orifice, and rectum were photographed.                            The bowel preparation used was Plenvu via split                            dose instruction. Scope In: 2:46:52 PM Scope Out: 3:00:48 PM Scope Withdrawal Time: 0 hours 10 minutes 42 seconds  Total Procedure Duration: 0 hours 13 minutes 56 seconds  Findings:                 The perianal and digital rectal examinations were                            normal.                           The entire examined colon appeared normal on direct                            and retroflexion views. Complications:            No immediate complications. Estimated Blood Loss:     Estimated blood loss: none. Impression:               - The entire examined colon is normal on direct  and                            retroflexion views.                           - No specimens collected. Recommendation:           - Patient has a contact number available for                            emergencies. The signs and symptoms of potential                            delayed complications were discussed with the                            patient. Return to normal activities tomorrow.                            Written discharge instructions were provided to the                            patient.                           - Resume previous diet.                           - Continue present medications. MiraLax to treat                            constipation. titrate for effect. Try to get                            probiotic foods and 30 plants/week also to promote                            healthy gut  microbiome.                           - No routine repeat colonoscopy due to current age                            (44 years or older) and the absence of colonic                            polyps. should a repeat colonoscopy ever be                            indicated she requests a different prep than Plenvu                            as it caused bad abominal cramps. Gatha Mayer, MD 05/25/2022 3:09:18 PM This report has been signed electronically.

## 2022-05-25 NOTE — Patient Instructions (Addendum)
The colonoscopy was normal.  Please keep taking MiraLax to treat constipation. Titrate for effect - full dose daily vs 1/2 dose etc  Probiotics are important for health, they are best gotten through foods.  Pills are not proven to be effective in some research is suggesting they may actually cause problems.  Here are some examples of probiotic foods (also known as fermented foods):  Yogurt-high protein whole fat yogurt is best I believe, [place] yogurt is an example.  Add your own fruit because sugar is almost always added to yogurt with fruit in it found at the store.  Kefir-kind of like liquid yogurt  Miso-an Asian fermented bean paste  Sauerkraut  Kimchi-Asian fermented cabbage  Apple cider vinegar-use the liquid not Gummies or pills.  Some people can tolerate a spoonful of this alone but many fine adding it to water reduces the acidity.  You can work up to higher doses.  If taking it on deluded rinse your mouth or drink water afterwards to reduce risk of dental damage.  A tablespoon a day is a good dose.  You could take more also.  Some people also find this helpful with reflux.  Try to get 30 different plants in your meals each week.  This is easier than it sounds since it includes vegetables, fruits, seeds and nuts as well as spices.  Also try to have a variety of colors in your plants for the week. The purpose is to feed and support a diverse gut microbiome-the bacteria that live inside of you.  No more Plenvu for you.  I appreciate the opportunity to care for you. Gatha Mayer, MD, FACG   YOU HAD AN ENDOSCOPIC PROCEDURE TODAY AT [facility]:   Refer to the procedure report that was given to you for any specific questions about what was found during the examination.  If the procedure report does not answer your questions, please call your gastroenterologist to clarify.  If you requested that your care partner not be given the details of your procedure findings,  then the procedure report has been included in a sealed envelope for you to review at your convenience later.  YOU SHOULD EXPECT: Some feelings of bloating in the abdomen. Passage of more gas than usual.  Walking can help get rid of the air that was put into your GI tract during the procedure and reduce the bloating. If you had a lower endoscopy (such as a colonoscopy or flexible sigmoidoscopy) you may notice spotting of blood in your stool or on the toilet paper. If you underwent a bowel prep for your procedure, you may not have a normal bowel movement for a few days.  Please Note:  You might notice some irritation and congestion in your nose or some drainage.  This is from the oxygen used during your procedure.  There is no need for concern and it should clear up in a day or so.  SYMPTOMS TO REPORT IMMEDIATELY:  Following lower endoscopy (colonoscopy or flexible sigmoidoscopy):  Excessive amounts of blood in the stool  Significant tenderness or worsening of abdominal pains  Swelling of the abdomen that is new, acute  Fever of 100F or higher  For urgent or emergent issues, a gastroenterologist can be reached at any hour by calling [phone]. Do not use MyChart messaging for urgent concerns.    DIET:  We do recommend a small meal at first, but then you may proceed to your regular diet.  Drink plenty  of fluids but you should avoid alcoholic beverages for 24 hours.  ACTIVITY:  You should plan to take it easy for the rest of today and you should NOT DRIVE or use heavy machinery until tomorrow (because of the sedation medicines used during the test).    FOLLOW UP: Our staff will call the number listed on your records the next business day following your procedure.  We will call around 7:15- 8:00 am to check on you and address any questions or concerns that you may have regarding the information given to you following your procedure. If we do not reach you, we will leave a message.     If  any biopsies were taken you will be contacted by phone or by letter within the next 1-3 weeks.  Please call us at [phone] if you have not heard about the biopsies in 3 weeks.    SIGNATURES/CONFIDENTIALITY: You and/or your care partner have signed paperwork which will be entered into your electronic medical record.  These signatures attest to the fact that that the information above on your After Visit Summary has been reviewed and is understood.  Full responsibility of the confidentiality of this discharge information lies with you and/or your care-partner.

## 2022-05-25 NOTE — Progress Notes (Signed)
Report given to PACU, vss 

## 2022-05-25 NOTE — Progress Notes (Signed)
History and Physical Interval Note:  05/25/2022 2:35 PM  Brianna Harper  has presented today for endoscopic procedure(s), with the diagnosis of  Encounter Diagnoses  Name Primary?   Change in bowel habits Yes   Constipation, unspecified constipation type   .  The various methods of evaluation and treatment have been discussed with the patient and/or family. After consideration of risks, benefits and other options for treatment, the patient has consented to  the endoscopic procedure(s).   The patient's history has been reviewed, patient examined, no change in status, stable for endoscopic procedure(s).  I have reviewed the patient's chart and labs.  Questions were answered to the patient's satisfaction.    BP (!) 180/86   Pulse 76   Temp 98 F (36.7 C) (Temporal)   Resp 19   Ht '5\' 6"'$  (1.676 m)   Wt 137 lb (62.1 kg)   SpO2 100%   BMI 22.11 kg/m    Gatha Mayer, MD, [facility]

## 2022-05-25 NOTE — Progress Notes (Signed)
Pt's states no medical or surgical changes since previsit or office visit. 

## 2022-05-26 ENCOUNTER — Telehealth: Payer: Self-pay

## 2022-05-26 NOTE — Telephone Encounter (Signed)
  Follow up Call-     05/25/2022    1:41 PM  Call back number  Post procedure Call Back phone  # [phone]  Permission to leave phone message Yes     Patient questions:  Do you have a fever, pain , or abdominal swelling? No. Pain Score  0 *  Have you tolerated food without any problems? Yes.    Have you been able to return to your normal activities? Yes.    Do you have any questions about your discharge instructions: Diet   No. Medications  No. Follow up visit  No.  Do you have questions or concerns about your Care? No.  Actions: * If pain score is 4 or above: No action needed, pain <4.

## 2022-06-10 DIAGNOSIS — H524 Presbyopia: Secondary | ICD-10-CM | POA: Diagnosis not present

## 2022-06-15 DIAGNOSIS — H524 Presbyopia: Secondary | ICD-10-CM | POA: Diagnosis not present

## 2022-07-30 DIAGNOSIS — K582 Mixed irritable bowel syndrome: Secondary | ICD-10-CM | POA: Diagnosis not present

## 2022-07-30 DIAGNOSIS — I10 Essential (primary) hypertension: Secondary | ICD-10-CM | POA: Diagnosis not present

## 2022-07-30 DIAGNOSIS — E785 Hyperlipidemia, unspecified: Secondary | ICD-10-CM | POA: Diagnosis not present

## 2022-07-30 DIAGNOSIS — E039 Hypothyroidism, unspecified: Secondary | ICD-10-CM | POA: Diagnosis not present

## 2022-08-13 ENCOUNTER — Other Ambulatory Visit: Payer: Self-pay

## 2022-08-13 ENCOUNTER — Telehealth: Payer: Self-pay | Admitting: Internal Medicine

## 2022-08-13 DIAGNOSIS — R197 Diarrhea, unspecified: Secondary | ICD-10-CM

## 2022-08-13 DIAGNOSIS — R109 Unspecified abdominal pain: Secondary | ICD-10-CM

## 2022-08-13 DIAGNOSIS — R110 Nausea: Secondary | ICD-10-CM

## 2022-08-13 NOTE — Telephone Encounter (Signed)
Patient called stating she experiencing diarrhea for over a week and nothing seems to be helping. She is requesting a call back to see what could be done. Please advise, thank you.

## 2022-08-13 NOTE — Telephone Encounter (Signed)
Agree w/ RN recommendations thus far  She should come for GI pathogen profile testing and a C diff test  Can use loperamide up to 4 a day  She could see PCP if desired and possible - I do not think we have any availability today  If severley ill then ED

## 2022-08-13 NOTE — Telephone Encounter (Signed)
Spoke with patient regarding MD recommendations. She plans to come in today to pick up stool kit. Advised her on when/where to go. She verbalized all understanding.

## 2022-08-13 NOTE — Telephone Encounter (Signed)
Patient called in with complaints of generalized abdominal discomfort, diarrhea (7-8 times/day) & nausea w/o vomiting for 1 week. She has been hesitant to take an anti-diarrheal given her history with constipation, however did take one yesterday along with IBGard, and it provided some relief but diarrhea returned again shortly after. She did stop miralax that she was previously taking daily. Advised her to drink plenty of fluids in the meantime & stick with a bland diet. Will send to MD for further recommendations. Last seen for colon on 05/25/22 with Dr. Leone Payor.

## 2022-08-16 ENCOUNTER — Ambulatory Visit: Payer: Medicare Other

## 2022-08-16 DIAGNOSIS — R197 Diarrhea, unspecified: Secondary | ICD-10-CM

## 2022-08-16 DIAGNOSIS — R110 Nausea: Secondary | ICD-10-CM

## 2022-08-16 DIAGNOSIS — R109 Unspecified abdominal pain: Secondary | ICD-10-CM

## 2022-08-17 LAB — GI PROFILE, STOOL, PCR

## 2022-08-17 LAB — CLOSTRIDIUM DIFFICILE BY PCR

## 2022-08-18 NOTE — Telephone Encounter (Signed)
I called patient and reviewed the results are negative for infection on the stool studies.  She has been having some success with taking 1 Imodium after a watery stool in the morning.  Today she had a few more watery stools.  She is not having any pain or fever.  This is a relatively new change for her tended towards constipation.  I cannot remember if she has had diarrhea problems in the past but she asked if IBS can switch from constipation to diarrhea and the answer is yes.  She is on olmesartan for many years her blood pressure has been somewhat difficult to control.  That is a possible cause of diarrhea I told her but we would not make a change on that right now.  My recommendations to her right now are to use 1 Imodium every night and also use it as needed to try to control diarrhea.   If she gets constipated and does not go for couple of days she should hold the Imodium.   PJ-lets get her a follow-up appointment she can have one of the nurse visits or banding spots that are available in July, and she can cancel if everything returns to baseline before then let her know.

## 2022-08-18 NOTE — Telephone Encounter (Signed)
Patient called stating she would like to speak regarding results of stool test. Patient is also requesting to discuss when she should take imodium. States she is still having diarrhea everyday that lasts all morning. Please advise, thank you.

## 2022-08-18 NOTE — Telephone Encounter (Signed)
Appointment set up for 10/05/2022 at 11:30AM, she will cancel if better.

## 2022-10-05 ENCOUNTER — Other Ambulatory Visit: Payer: Self-pay | Admitting: Internal Medicine

## 2022-10-05 ENCOUNTER — Ambulatory Visit: Payer: Medicare Other | Admitting: Internal Medicine

## 2022-10-05 DIAGNOSIS — Z1231 Encounter for screening mammogram for malignant neoplasm of breast: Secondary | ICD-10-CM

## 2022-11-02 ENCOUNTER — Ambulatory Visit
Admission: RE | Admit: 2022-11-02 | Discharge: 2022-11-02 | Disposition: A | Discharge status: Home / Self Care | Payer: Medicare Other | Source: Ambulatory Visit | Attending: Internal Medicine | Admitting: Internal Medicine

## 2022-11-02 DIAGNOSIS — Z1231 Encounter for screening mammogram for malignant neoplasm of breast: Secondary | ICD-10-CM | POA: Diagnosis not present

## 2022-12-30 DIAGNOSIS — I788 Other diseases of capillaries: Secondary | ICD-10-CM | POA: Diagnosis not present

## 2022-12-30 DIAGNOSIS — L723 Sebaceous cyst: Secondary | ICD-10-CM | POA: Diagnosis not present

## 2022-12-30 DIAGNOSIS — D692 Other nonthrombocytopenic purpura: Secondary | ICD-10-CM | POA: Diagnosis not present

## 2022-12-30 DIAGNOSIS — L400 Psoriasis vulgaris: Secondary | ICD-10-CM | POA: Diagnosis not present

## 2023-01-24 DIAGNOSIS — Z0001 Encounter for general adult medical examination with abnormal findings: Secondary | ICD-10-CM | POA: Diagnosis not present

## 2023-01-24 DIAGNOSIS — E039 Hypothyroidism, unspecified: Secondary | ICD-10-CM | POA: Diagnosis not present

## 2023-01-24 DIAGNOSIS — I10 Essential (primary) hypertension: Secondary | ICD-10-CM | POA: Diagnosis not present

## 2023-01-24 DIAGNOSIS — E785 Hyperlipidemia, unspecified: Secondary | ICD-10-CM | POA: Diagnosis not present

## 2023-01-31 DIAGNOSIS — Z23 Encounter for immunization: Secondary | ICD-10-CM | POA: Diagnosis not present

## 2023-01-31 DIAGNOSIS — Z1331 Encounter for screening for depression: Secondary | ICD-10-CM | POA: Diagnosis not present

## 2023-01-31 DIAGNOSIS — Z1339 Encounter for screening examination for other mental health and behavioral disorders: Secondary | ICD-10-CM | POA: Diagnosis not present

## 2023-01-31 DIAGNOSIS — Z0000 Encounter for general adult medical examination without abnormal findings: Secondary | ICD-10-CM | POA: Diagnosis not present

## 2023-01-31 DIAGNOSIS — R82998 Other abnormal findings in urine: Secondary | ICD-10-CM | POA: Diagnosis not present

## 2023-01-31 DIAGNOSIS — I10 Essential (primary) hypertension: Secondary | ICD-10-CM | POA: Diagnosis not present

## 2023-02-16 DIAGNOSIS — K080 Exfoliation of teeth due to systemic causes: Secondary | ICD-10-CM | POA: Diagnosis not present

## 2023-03-28 DIAGNOSIS — H524 Presbyopia: Secondary | ICD-10-CM | POA: Diagnosis not present

## 2023-04-08 DIAGNOSIS — E039 Hypothyroidism, unspecified: Secondary | ICD-10-CM | POA: Diagnosis not present

## 2023-04-08 DIAGNOSIS — I10 Essential (primary) hypertension: Secondary | ICD-10-CM | POA: Diagnosis not present

## 2023-05-06 ENCOUNTER — Other Ambulatory Visit (HOSPITAL_COMMUNITY): Payer: Self-pay | Admitting: Registered Nurse

## 2023-05-06 DIAGNOSIS — R519 Headache, unspecified: Secondary | ICD-10-CM | POA: Diagnosis not present

## 2023-05-06 DIAGNOSIS — I10 Essential (primary) hypertension: Secondary | ICD-10-CM | POA: Diagnosis not present

## 2023-05-12 DIAGNOSIS — I10 Essential (primary) hypertension: Secondary | ICD-10-CM | POA: Diagnosis not present

## 2023-05-17 DIAGNOSIS — I10 Essential (primary) hypertension: Secondary | ICD-10-CM | POA: Diagnosis not present

## 2023-05-20 DIAGNOSIS — I10 Essential (primary) hypertension: Secondary | ICD-10-CM | POA: Diagnosis not present

## 2023-06-01 ENCOUNTER — Encounter: Payer: Self-pay | Admitting: Internal Medicine

## 2023-06-01 ENCOUNTER — Other Ambulatory Visit: Payer: Self-pay | Admitting: Internal Medicine

## 2023-06-01 DIAGNOSIS — I10 Essential (primary) hypertension: Secondary | ICD-10-CM

## 2023-06-02 ENCOUNTER — Other Ambulatory Visit

## 2023-06-07 ENCOUNTER — Ambulatory Visit
Admission: RE | Admit: 2023-06-07 | Discharge: 2023-06-07 | Disposition: A | Discharge status: Home / Self Care | Source: Ambulatory Visit | Attending: Internal Medicine | Admitting: Internal Medicine

## 2023-06-07 ENCOUNTER — Encounter: Payer: Self-pay | Admitting: Internal Medicine

## 2023-06-07 DIAGNOSIS — N289 Disorder of kidney and ureter, unspecified: Secondary | ICD-10-CM | POA: Diagnosis not present

## 2023-06-07 DIAGNOSIS — I10 Essential (primary) hypertension: Secondary | ICD-10-CM

## 2023-06-13 DIAGNOSIS — H5203 Hypermetropia, bilateral: Secondary | ICD-10-CM | POA: Diagnosis not present

## 2023-08-02 DIAGNOSIS — I10 Essential (primary) hypertension: Secondary | ICD-10-CM | POA: Diagnosis not present

## 2023-08-17 ENCOUNTER — Emergency Department (HOSPITAL_COMMUNITY)

## 2023-08-17 ENCOUNTER — Other Ambulatory Visit: Payer: Self-pay

## 2023-08-17 ENCOUNTER — Emergency Department (HOSPITAL_COMMUNITY)
Admission: EM | Admit: 2023-08-17 | Discharge: 2023-08-17 | Disposition: A | Discharge status: Home / Self Care | Attending: Emergency Medicine | Admitting: Emergency Medicine

## 2023-08-17 DIAGNOSIS — R0789 Other chest pain: Secondary | ICD-10-CM | POA: Diagnosis not present

## 2023-08-17 DIAGNOSIS — R61 Generalized hyperhidrosis: Secondary | ICD-10-CM | POA: Insufficient documentation

## 2023-08-17 DIAGNOSIS — I10 Essential (primary) hypertension: Secondary | ICD-10-CM | POA: Insufficient documentation

## 2023-08-17 DIAGNOSIS — Z79899 Other long term (current) drug therapy: Secondary | ICD-10-CM | POA: Diagnosis not present

## 2023-08-17 DIAGNOSIS — R519 Headache, unspecified: Secondary | ICD-10-CM | POA: Diagnosis not present

## 2023-08-17 DIAGNOSIS — R079 Chest pain, unspecified: Secondary | ICD-10-CM | POA: Diagnosis not present

## 2023-08-17 DIAGNOSIS — R0600 Dyspnea, unspecified: Secondary | ICD-10-CM | POA: Insufficient documentation

## 2023-08-17 DIAGNOSIS — R739 Hyperglycemia, unspecified: Secondary | ICD-10-CM | POA: Insufficient documentation

## 2023-08-17 LAB — CBC
HCT: 40.3 % (ref 36.0–46.0)
Hemoglobin: 13.4 g/dL (ref 12.0–15.0)
MCH: 31.3 pg (ref 26.0–34.0)
MCHC: 33.3 g/dL (ref 30.0–36.0)
MCV: 94.2 fL (ref 80.0–100.0)
Platelets: 226 10*3/uL (ref 150–400)
RBC: 4.28 MIL/uL (ref 3.87–5.11)
RDW: 12.8 % (ref 11.5–15.5)
WBC: 5.4 10*3/uL (ref 4.0–10.5)
nRBC: 0 % (ref 0.0–0.2)

## 2023-08-17 LAB — PROTIME-INR
INR: 1 (ref 0.8–1.2)
Prothrombin Time: 13.2 s (ref 11.4–15.2)

## 2023-08-17 LAB — BASIC METABOLIC PANEL WITH GFR
Anion gap: 11 (ref 5–15)
BUN: 25 mg/dL — ABNORMAL HIGH (ref 8–23)
CO2: 24 mmol/L (ref 22–32)
Calcium: 9.8 mg/dL (ref 8.9–10.3)
Chloride: 106 mmol/L (ref 98–111)
Creatinine, Ser: 0.92 mg/dL (ref 0.44–1.00)
GFR, Estimated: >60 mL/min (ref >60)
Glucose, Bld: 106 mg/dL — ABNORMAL HIGH (ref 70–99)
Potassium: 3.8 mmol/L (ref 3.5–5.1)
Sodium: 141 mmol/L (ref 135–145)

## 2023-08-17 LAB — TROPONIN I (HIGH SENSITIVITY)
Troponin I (High Sensitivity): 3 ng/L (ref <18)
Troponin I (High Sensitivity): 3 ng/L (ref <18)

## 2023-08-17 LAB — BRAIN NATRIURETIC PEPTIDE: B Natriuretic Peptide: 126.5 pg/mL — ABNORMAL HIGH (ref 0.0–100.0)

## 2023-08-17 MED ORDER — PROCHLORPERAZINE EDISYLATE 10 MG/2ML IJ SOLN: 10.0000 mg | Freq: Once | INTRAMUSCULAR | Status: DC

## 2023-08-17 MED ORDER — ASPIRIN 81 MG PO CHEW
324.0000 mg | CHEWABLE_TABLET | Freq: Once | ORAL | Status: AC
Start: 2023-08-17 — End: 2023-08-17
  Administered 2023-08-17: 324 mg via ORAL
  Filled 2023-08-17: qty 4

## 2023-08-17 MED ORDER — LABETALOL HCL 5 MG/ML IV SOLN: 20.0000 mg | Freq: Once | INTRAVENOUS | Status: DC

## 2023-08-17 NOTE — ED Triage Notes (Addendum)
 Patient reports central chest tightness with SOB this morning , no emesis or diaphoresis . Hypertensive at arrival. Charge nurse notified for room placement .

## 2023-08-17 NOTE — Discharge Instructions (Signed)
 Call Dr. Gillermina Lacer office today to discuss outpatient blood pressure control.  We are also referring you to cardiology for your chest pain.  While there was no evidence of a heart attack today, this does not rule out all heart disease.  If you develop recurrent, continued, or worsening chest pain, shortness of breath, fever, vomiting, abdominal or back pain, or any other new/concerning symptoms then return to the ER for evaluation.

## 2023-08-17 NOTE — ED Provider Notes (Signed)
 Care transferred to me.  Second troponin is normal.  Blood pressure is dramatically improved from when she first got here and she is asymptomatic at this time.  She has chronic difficulty controlling her blood pressure and is recommended to call her PCP this morning for better blood pressure control and will also refer to cardiology.  We did discuss that that while there was no MI this does not rule out all cardiac disease but she does prefer to go home with outpatient cardiology follow-up.  We discussed return precautions.   Jerilynn Montenegro, MD 08/17/23 [phone]

## 2023-08-17 NOTE — ED Provider Notes (Signed)
 [facility] Provider Note   CSN: [id number] Arrival date & time: 08/17/23  0249     History  Chief Complaint  Patient presents with   Chest Pain    Hypertensive    Brianna Harper is a 78 y.o. female.  The history is provided by the patient.  Chest Pain She has history of hypertension, hyperlipidemia, GERD and was awakened by a sense of pressure in her chest and about midnight.  There was some associated dyspnea, nausea, diaphoresis.  Pressure is easing but still present.  She also has noted a very bad headache.  She denies any visual change, tinnitus, epistaxis.  She states she has been compliant with her blood pressure medications and states that her clonidine patch was recently increased from 0.1 mg to 0.2 mg and her olmesartan was increased from 40 mg to 50 mg.   Home Medications Prior to Admission medications   Medication Sig Start Date End Date Taking? Authorizing Provider  clobetasol (TEMOVATE) 0.05 % external solution Apply 1 Application topically as needed. 04/21/22   [provider]  cloNIDine (CATAPRES) 0.1 MG tablet Take 0.1 mg by mouth. PRN    [provider]  doxepin (SINEQUAN) 10 MG capsule Take 10 mg by mouth at bedtime.  04/13/19 05/25/22  [provider]  hydrocortisone  (ANUSOL -HC) 2.5 % rectal cream Place 1 Application rectally 2 (two) times daily as needed for hemorrhoids. 05/06/22   Kenney Peacemaker, MD  levothyroxine (SYNTHROID) 25 MCG tablet Take 25 mcg by mouth daily before breakfast.    [provider]  metoprolol  tartrate (LOPRESSOR ) 25 MG tablet Take 1 tablet (25 mg total) by mouth 2 (two) times daily. 06/24/14   Panosh, Wanda K, MD  Multiple Vitamin (MULTIVITAMIN) capsule Take 1 capsule by mouth daily.    [provider]  olmesartan (BENICAR) 20 MG tablet Take 20 mg by mouth daily.    [provider]  VERAPAMIL  HCL ER, CO, PO Take 240 mg by mouth.     [provider]      Allergies    Lisinopril , Erythromycin ethylsuccinate, Nitrofurantoin, Cymbalta  [duloxetine  hcl], Gabapentin , Lidocaine , Lyrica  [pregabalin ], Tizanidine , and Sulfadiazine    Review of Systems   Review of Systems  Cardiovascular:  Positive for chest pain.  All other systems reviewed and are negative.   Physical Exam Updated Vital Signs BP (!) 162/66   Pulse (!) 57   Temp 97.9 F (36.6 C) (Oral)   Resp 15   SpO2 99%  Physical Exam Vitals and nursing note reviewed.   78 year old female, resting comfortably and in no acute distress. Vital signs are significant for markedly elevated blood pressure. Oxygen  saturation is 98%, which is normal. Head is normocephalic and atraumatic. PERRLA, EOMI. Oropharynx is clear.  There is a subconjunctival hemorrhage present on the left.  Fundi show no hemorrhage, exudate, papilledema. Neck is nontender and supple without adenopathy or JVD. Lungs are clear without rales, wheezes, or rhonchi. Chest is nontender. Heart has regular rate and rhythm without murmur. Abdomen is soft, flat, nontender. Extremities have no edema, full range of motion is present. Skin is warm and dry without rash. Neurologic: Awake and alert and oriented, cranial nerves are intact, moves all extremities equally.  ED Results / Procedures / Treatments   Labs (all labs ordered are listed, but only abnormal results are displayed) Labs Reviewed  BASIC METABOLIC PANEL WITH GFR - Abnormal; Notable for the following components:  Result Value   Glucose, Bld 106 (*)    BUN 25 (*)    All other components within normal limits  BRAIN NATRIURETIC PEPTIDE - Abnormal; Notable for the following components:   B Natriuretic Peptide 126.5 (*)    All other components within normal limits  CBC  PROTIME-INR  TROPONIN I (HIGH SENSITIVITY)  TROPONIN I (HIGH SENSITIVITY)    EKG EKG Interpretation Date/Time:  Wednesday Aug 17 2023 03:01:03 EDT Ventricular Rate:   75 PR Interval:  152 QRS Duration:  84 QT Interval:  408 QTC Calculation: 455 R Axis:   63  Text Interpretation: Normal sinus rhythm Normal ECG When compared with ECG of 02-Mar-2018 14:51, No significant change was found Confirmed by Alissa April ([id number]) on 08/17/2023 3:15:49 AM  Radiology DG Chest 2 View Result Date: 08/17/2023 CLINICAL DATA:  Chest pain EXAM: CHEST - 2 VIEW COMPARISON:  06/18/2017 FINDINGS: The heart size and mediastinal contours are within normal limits. Both lungs are clear. The visualized skeletal structures are unremarkable. IMPRESSION: No active cardiopulmonary disease. Electronically Signed   By: Violeta Grey M.D.   On: 08/17/2023 03:29    Procedures Procedures  Cardiac monitor shows normal sinus rhythm, per my interpretation.  Medications Ordered in ED Medications  labetalol  (NORMODYNE ) injection 20 mg (0 mg Intravenous Hold 08/17/23 0359)  prochlorperazine (COMPAZINE) injection 10 mg (10 mg Intravenous Not Given 08/17/23 0630)  aspirin  chewable tablet 324 mg (324 mg Oral Given 08/17/23 0359)    ED Course/ Medical Decision Making/ A&P                                 Medical Decision Making Amount and/or Complexity of Data Reviewed Labs: ordered. Radiology: ordered.  Risk OTC drugs. Prescription drug management.   Chest discomfort in the setting of markedly elevated blood pressure.  This is a presentation that has a wide range of treatment options and entails a high risk of morbidity and complications.  Differential diagnosis includes, but is not limited to, ACS, hypertensive emergency.  Doubt pulmonary embolism, pneumonia.  I have reviewed her electrocardiogram, my interpretation is normal ECG and unchanged from prior.  I have ordered intravenous labetalol  for her blood pressure and I have also ordered a dose of oral aspirin .  I have reviewed her past records, and note CT cardiac scoring on 02/23/2021 with a coronary artery calcium score of 0, which would  put her at very low risk for obstructive coronary artery disease.  Renal ultrasound on 06/07/2023 showed increased renal parenchymal echogenicity typical of chronic medical renal disease.  Chest x-ray today shows no active cardiopulmonary disease.  I have independently viewed the images, and agree with the radiologist's interpretation.  I have reviewed her laboratory tests and my interpretation is mildly elevated random glucose level which will need to be followed as an outpatient, mildly elevated BUN of doubtful clinical significance, normal CBC, normal troponin, slightly elevated BNP of uncertain clinical significance.  Patient's blood pressure came down on its own even before labetalol  could be given.  With this, her symptoms have improved but not completely resolved.  I have ordered a dose of prochlorperazine for her headache.  Repeat troponin is pending.  Case is down to Dr. Aldean Amass  CRITICAL CARE Performed by: Alissa April Total critical care time: 35 minutes Critical care time was exclusive of separately billable procedures and treating other patients. Critical care was necessary to treat or prevent imminent  or life-threatening deterioration. Critical care was time spent personally by me on the following activities: development of treatment plan with patient and/or surrogate as well as nursing, discussions with consultants, evaluation of patient's response to treatment, examination of patient, obtaining history from patient or surrogate, ordering and performing treatments and interventions, ordering and review of laboratory studies, ordering and review of radiographic studies, pulse oximetry and re-evaluation of patient's condition.  Final Clinical Impression(s) / ED Diagnoses Final diagnoses:  Nonspecific chest pain  Bad headache  Elevated blood pressure reading with diagnosis of hypertension  Elevated random blood glucose level    Rx / DC Orders ED Discharge Orders     None          Alissa April, MD 08/17/23 [phone]

## 2023-09-02 DIAGNOSIS — I10 Essential (primary) hypertension: Secondary | ICD-10-CM | POA: Diagnosis not present

## 2023-09-20 DIAGNOSIS — K080 Exfoliation of teeth due to systemic causes: Secondary | ICD-10-CM | POA: Diagnosis not present

## 2023-09-29 ENCOUNTER — Other Ambulatory Visit: Payer: Self-pay | Admitting: Internal Medicine

## 2023-09-29 DIAGNOSIS — Z1231 Encounter for screening mammogram for malignant neoplasm of breast: Secondary | ICD-10-CM

## 2023-10-12 ENCOUNTER — Emergency Department (HOSPITAL_BASED_OUTPATIENT_CLINIC_OR_DEPARTMENT_OTHER)
Admission: EM | Admit: 2023-10-12 | Discharge: 2023-10-12 | Disposition: A | Discharge status: Home / Self Care | Attending: Emergency Medicine | Admitting: Emergency Medicine

## 2023-10-12 ENCOUNTER — Emergency Department (HOSPITAL_BASED_OUTPATIENT_CLINIC_OR_DEPARTMENT_OTHER): Admitting: Radiology

## 2023-10-12 ENCOUNTER — Other Ambulatory Visit: Payer: Self-pay

## 2023-10-12 ENCOUNTER — Encounter (HOSPITAL_BASED_OUTPATIENT_CLINIC_OR_DEPARTMENT_OTHER): Payer: Self-pay

## 2023-10-12 DIAGNOSIS — U071 COVID-19: Secondary | ICD-10-CM | POA: Insufficient documentation

## 2023-10-12 DIAGNOSIS — R079 Chest pain, unspecified: Secondary | ICD-10-CM | POA: Diagnosis not present

## 2023-10-12 DIAGNOSIS — R0789 Other chest pain: Secondary | ICD-10-CM | POA: Diagnosis not present

## 2023-10-12 DIAGNOSIS — R059 Cough, unspecified: Secondary | ICD-10-CM | POA: Diagnosis not present

## 2023-10-12 LAB — TROPONIN T, HIGH SENSITIVITY: Troponin T High Sensitivity: <15 ng/L (ref <19)

## 2023-10-12 LAB — BASIC METABOLIC PANEL WITH GFR
Anion gap: 11 (ref 5–15)
BUN: 13 mg/dL (ref 8–23)
CO2: 24 mmol/L (ref 22–32)
Calcium: 9.6 mg/dL (ref 8.9–10.3)
Chloride: 103 mmol/L (ref 98–111)
Creatinine, Ser: 1.11 mg/dL — ABNORMAL HIGH (ref 0.44–1.00)
GFR, Estimated: 51 mL/min — ABNORMAL LOW (ref >60)
Glucose, Bld: 169 mg/dL — ABNORMAL HIGH (ref 70–99)
Potassium: 3.7 mmol/L (ref 3.5–5.1)
Sodium: 139 mmol/L (ref 135–145)

## 2023-10-12 LAB — CBC
HCT: 38 % (ref 36.0–46.0)
Hemoglobin: 12.9 g/dL (ref 12.0–15.0)
MCH: 31.7 pg (ref 26.0–34.0)
MCHC: 33.9 g/dL (ref 30.0–36.0)
MCV: 93.4 fL (ref 80.0–100.0)
Platelets: 188 K/uL (ref 150–400)
RBC: 4.07 MIL/uL (ref 3.87–5.11)
RDW: 12.9 % (ref 11.5–15.5)
WBC: 5.4 K/uL (ref 4.0–10.5)
nRBC: 0 % (ref 0.0–0.2)

## 2023-10-12 LAB — RESP PANEL BY RT-PCR (RSV, FLU A&B, COVID)  RVPGX2
Influenza A by PCR: NEGATIVE
Influenza B by PCR: NEGATIVE
Resp Syncytial Virus by PCR: NEGATIVE
SARS Coronavirus 2 by RT PCR: POSITIVE — AB

## 2023-10-12 MED ORDER — SODIUM CHLORIDE 0.9 % IV BOLUS
1000.0000 mL | Freq: Once | INTRAVENOUS | Status: AC
  Administered 2023-10-12: 1000 mL via INTRAVENOUS

## 2023-10-12 MED ORDER — BENZONATATE 100 MG PO CAPS: 100.0000 mg | ORAL_CAPSULE | Freq: Three times a day (TID) | ORAL | 0 refills | Status: AC

## 2023-10-12 MED ORDER — ONDANSETRON 4 MG PO TBDP: ORAL_TABLET | ORAL | 0 refills | Status: AC

## 2023-10-12 MED ORDER — KETOROLAC TROMETHAMINE 15 MG/ML IJ SOLN
15.0000 mg | Freq: Once | INTRAMUSCULAR | Status: AC
  Administered 2023-10-12: 15 mg via INTRAVENOUS
  Filled 2023-10-12: qty 1

## 2023-10-12 NOTE — Discharge Instructions (Signed)
 Take tylenol 2 pills 4 times a day and motrin 4 pills 3 times a day.  Drink plenty of fluids.  Return for worsening shortness of breath, headache, confusion. Follow up with your family doctor.

## 2023-10-12 NOTE — ED Notes (Signed)
 ED Provider at bedside.

## 2023-10-12 NOTE — ED Notes (Signed)
Reviewed discharge instructions, medications, and home care with pt. Pt verbalized understanding and had no further questions. Pt exited ED without complications.

## 2023-10-12 NOTE — ED Provider Notes (Signed)
 Brianna Harper Provider Note   CSN: [id number] Arrival date & time: 10/12/23  1857     Patient presents with: Generalized Body Aches and Cough   Brianna Harper is a 78 y.o. female.   78 yo F with a chief complaint of cough congestion fever.  She just coming back from a trip to [place].  Tells me that on the airplane she started feeling bad and when she got off realized that she was coughing and congested.  She called her doctor who told her to take some medications and if she was not feeling better see them in the office tomorrow.  Felt her symptoms had not significantly improved and came in for evaluation.  No obvious sick contacts.     Cough      Prior to Admission medications   Medication Sig Start Date End Date Taking? Authorizing Provider  benzonatate  (TESSALON ) 100 MG capsule Take 1 capsule (100 mg total) by mouth every 8 (eight) hours. 10/12/23  Yes Emil Share, DO  ondansetron  (ZOFRAN -ODT) 4 MG disintegrating tablet 4mg  ODT q4 hours prn nausea/vomit 10/12/23  Yes Mistee Soliman, DO  clobetasol (TEMOVATE) 0.05 % external solution Apply 1 Application topically as needed. 04/21/22   [provider]  cloNIDine (CATAPRES) 0.1 MG tablet Take 0.1 mg by mouth. PRN    [provider]  doxepin (SINEQUAN) 10 MG capsule Take 10 mg by mouth at bedtime.  04/13/19 05/25/22  [provider]  hydrocortisone  (ANUSOL -HC) 2.5 % rectal cream Place 1 Application rectally 2 (two) times daily as needed for hemorrhoids. 05/06/22   Avram Lupita BRAVO, MD  levothyroxine (SYNTHROID) 25 MCG tablet Take 25 mcg by mouth daily before breakfast.    [provider]  metoprolol  tartrate (LOPRESSOR ) 25 MG tablet Take 1 tablet (25 mg total) by mouth 2 (two) times daily. 06/24/14   Panosh, Wanda K, MD  Multiple Vitamin (MULTIVITAMIN) capsule Take 1 capsule by mouth daily.    [provider]  olmesartan (BENICAR) 20 MG tablet Take 20 mg by  mouth daily.    [provider]  VERAPAMIL  HCL ER, CO, PO Take 240 mg by mouth.     [provider]    Allergies: Lisinopril , Erythromycin ethylsuccinate, Nitrofurantoin, Cymbalta  [duloxetine  hcl], Gabapentin , Lidocaine , Lyrica  [pregabalin ], Tizanidine , and Sulfadiazine    Review of Systems  Respiratory:  Positive for cough.     Updated Vital Signs BP (!) 144/85 (BP Location: Right Arm)   Pulse 91   Temp 99.2 F (37.3 C) (Oral)   Resp 16   SpO2 95%   Physical Exam Vitals and nursing note reviewed.  Constitutional:      General: She is not in acute distress.    Appearance: She is well-developed. She is not diaphoretic.  HENT:     Head: Normocephalic and atraumatic.     Comments: Swollen turbinates posterior nasal drip TMs are normal bilaterally. Eyes:     Pupils: Pupils are equal, round, and reactive to light.  Cardiovascular:     Rate and Rhythm: Normal rate and regular rhythm.     Heart sounds: No murmur heard.    No friction rub. No gallop.  Pulmonary:     Effort: Pulmonary effort is normal.     Breath sounds: No wheezing or rales.  Abdominal:     General: There is no distension.     Palpations: Abdomen is soft.     Tenderness: There is no abdominal tenderness.  Musculoskeletal:        General: No tenderness.     Cervical back: Normal range of motion and neck supple.  Skin:    General: Skin is warm and dry.  Neurological:     Mental Status: She is alert and oriented to person, place, and time.  Psychiatric:        Behavior: Behavior normal.     (all labs ordered are listed, but only abnormal results are displayed) Labs Reviewed  RESP PANEL BY RT-PCR (RSV, FLU A&B, COVID)  RVPGX2 - Abnormal; Notable for the following components:      Result Value   SARS Coronavirus 2 by RT PCR POSITIVE (*)    All other components within normal limits  BASIC METABOLIC PANEL WITH GFR - Abnormal; Notable for the following components:   Glucose, Bld 169 (*)     Creatinine, Ser 1.11 (*)    GFR, Estimated 51 (*)    All other components within normal limits  CBC  TROPONIN T, HIGH SENSITIVITY    EKG: None  Radiology: DG Chest 2 View Result Date: 10/12/2023 CLINICAL DATA:  Chest pain EXAM: CHEST - 2 VIEW COMPARISON:  Chest x-ray Aug 17, 2023 FINDINGS: The heart size is borderline normal. Mediastinal contours are within normal limits. Both lungs are clear. The visualized skeletal structures are unremarkable. IMPRESSION: No active cardiopulmonary disease. Electronically Signed   By: Megan  Zare M.D.   On: 10/12/2023 19:45     Procedures   Medications Ordered in the ED  sodium chloride  0.9 % bolus 1,000 mL (1,000 mLs Intravenous New Bag/Given 10/12/23 1953)  ketorolac  (TORADOL ) 15 MG/ML injection 15 mg (15 mg Intravenous Given 10/12/23 1953)                                    Medical Decision Making Amount and/or Complexity of Data Reviewed Labs: ordered. Radiology: ordered.  Risk Prescription drug management.   78 yo F with a chief complaint of cough congestion fevers going on for about 12 hours or so.  She recently traveled to [place] and just got back today.  Likely viral syndrome by history.  Laboratory evaluation and symptomatic therapy reassess.  Chest x-ray on my independent interpretation without focal infiltrate or pneumothorax.  Patient is COVID-positive.  Discussed risks and benefits of Paxlovid therapy currently electing to hold off.  Will follow-up with her PCP in the office.  8:21 PM:  I have discussed the diagnosis/risks/treatment options with the patient.  Evaluation and diagnostic testing in the emergency department does not suggest an emergent condition requiring admission or immediate intervention beyond what has been performed at this time.  They will follow up with PCP. We also discussed returning to the ED immediately if new or worsening sx occur. We discussed the sx which are most concerning (e.g., sudden worsening  pain, fever, inability to tolerate by mouth) that necessitate immediate return. Medications administered to the patient during their visit and any new prescriptions provided to the patient are listed below.  Medications given during this visit Medications  sodium chloride  0.9 % bolus 1,000 mL (1,000 mLs Intravenous New Bag/Given 10/12/23 1953)  ketorolac  (TORADOL ) 15 MG/ML injection 15 mg (15 mg Intravenous Given 10/12/23 1953)     The patient appears reasonably screen and/or stabilized for discharge and I doubt any other medical condition or other [facility] requiring further screening, evaluation, or treatment in the ED at this  time prior to discharge.        Final diagnoses:  COVID-19 virus infection    ED Discharge Orders          Ordered    benzonatate  (TESSALON ) 100 MG capsule  Every 8 hours        10/12/23 2020    ondansetron  (ZOFRAN -ODT) 4 MG disintegrating tablet        10/12/23 2020               Emil Share, DO 10/12/23 2021

## 2023-10-12 NOTE — ED Triage Notes (Signed)
 Pt reports she is here today due to cough,nasal congestion, body aches and chest pain. Pt reports she woke up today to these systems.Pt reports she recently had a 7hr flight home. Pt denies any sob.

## 2023-11-09 ENCOUNTER — Ambulatory Visit
Admission: RE | Admit: 2023-11-09 | Discharge: 2023-11-09 | Disposition: A | Discharge status: Home / Self Care | Source: Ambulatory Visit | Attending: Internal Medicine | Admitting: Internal Medicine

## 2023-11-09 DIAGNOSIS — Z1231 Encounter for screening mammogram for malignant neoplasm of breast: Secondary | ICD-10-CM

## 2023-12-01 DIAGNOSIS — K080 Exfoliation of teeth due to systemic causes: Secondary | ICD-10-CM | POA: Diagnosis not present

## 2023-12-19 DIAGNOSIS — K4190 Unilateral femoral hernia, without obstruction or gangrene, not specified as recurrent: Secondary | ICD-10-CM | POA: Diagnosis not present

## 2023-12-19 DIAGNOSIS — Z23 Encounter for immunization: Secondary | ICD-10-CM | POA: Diagnosis not present

## 2023-12-28 DIAGNOSIS — K4090 Unilateral inguinal hernia, without obstruction or gangrene, not specified as recurrent: Secondary | ICD-10-CM | POA: Diagnosis not present

## 2023-12-28 DIAGNOSIS — K429 Umbilical hernia without obstruction or gangrene: Secondary | ICD-10-CM | POA: Diagnosis not present

## 2024-01-10 DIAGNOSIS — L821 Other seborrheic keratosis: Secondary | ICD-10-CM | POA: Diagnosis not present

## 2024-01-10 DIAGNOSIS — D2271 Melanocytic nevi of right lower limb, including hip: Secondary | ICD-10-CM | POA: Diagnosis not present

## 2024-01-10 DIAGNOSIS — L400 Psoriasis vulgaris: Secondary | ICD-10-CM | POA: Diagnosis not present

## 2024-02-07 DIAGNOSIS — I10 Essential (primary) hypertension: Secondary | ICD-10-CM | POA: Diagnosis not present

## 2024-02-07 DIAGNOSIS — Z1212 Encounter for screening for malignant neoplasm of rectum: Secondary | ICD-10-CM | POA: Diagnosis not present

## 2024-02-07 DIAGNOSIS — E7849 Other hyperlipidemia: Secondary | ICD-10-CM | POA: Diagnosis not present

## 2024-02-14 DIAGNOSIS — R82998 Other abnormal findings in urine: Secondary | ICD-10-CM | POA: Diagnosis not present

## 2024-02-14 DIAGNOSIS — Z1331 Encounter for screening for depression: Secondary | ICD-10-CM | POA: Diagnosis not present

## 2024-02-14 DIAGNOSIS — K4190 Unilateral femoral hernia, without obstruction or gangrene, not specified as recurrent: Secondary | ICD-10-CM | POA: Diagnosis not present

## 2024-02-14 DIAGNOSIS — Z23 Encounter for immunization: Secondary | ICD-10-CM | POA: Diagnosis not present

## 2024-02-14 DIAGNOSIS — I10 Essential (primary) hypertension: Secondary | ICD-10-CM | POA: Diagnosis not present

## 2024-02-14 DIAGNOSIS — Z0000 Encounter for general adult medical examination without abnormal findings: Secondary | ICD-10-CM | POA: Diagnosis not present

## 2024-02-14 DIAGNOSIS — Z1339 Encounter for screening examination for other mental health and behavioral disorders: Secondary | ICD-10-CM | POA: Diagnosis not present

## 2024-03-13 DIAGNOSIS — Z01419 Encounter for gynecological examination (general) (routine) without abnormal findings: Secondary | ICD-10-CM | POA: Diagnosis not present
